# Patient Record
Sex: Female | Born: 1960 | Race: White | Hispanic: No | Marital: Married | Smoking: Former smoker
Health system: Southern US, Community
[De-identification: ages and names within clinical notes are randomized; demographics above are authoritative.]

## PROBLEM LIST (undated history)

## (undated) DIAGNOSIS — F32A Depression, unspecified: Secondary | ICD-10-CM

## (undated) DIAGNOSIS — M1990 Unspecified osteoarthritis, unspecified site: Secondary | ICD-10-CM

## (undated) DIAGNOSIS — F419 Anxiety disorder, unspecified: Secondary | ICD-10-CM

## (undated) DIAGNOSIS — M654 Radial styloid tenosynovitis [de Quervain]: Secondary | ICD-10-CM

## (undated) DIAGNOSIS — R112 Nausea with vomiting, unspecified: Secondary | ICD-10-CM

## (undated) DIAGNOSIS — D649 Anemia, unspecified: Secondary | ICD-10-CM

## (undated) DIAGNOSIS — Z9889 Other specified postprocedural states: Secondary | ICD-10-CM

## (undated) DIAGNOSIS — M2550 Pain in unspecified joint: Secondary | ICD-10-CM

## (undated) DIAGNOSIS — Z98811 Dental restoration status: Secondary | ICD-10-CM

## (undated) DIAGNOSIS — M549 Dorsalgia, unspecified: Secondary | ICD-10-CM

## (undated) DIAGNOSIS — R011 Cardiac murmur, unspecified: Secondary | ICD-10-CM

## (undated) DIAGNOSIS — M5030 Other cervical disc degeneration, unspecified cervical region: Secondary | ICD-10-CM

## (undated) DIAGNOSIS — E039 Hypothyroidism, unspecified: Secondary | ICD-10-CM

## (undated) DIAGNOSIS — K219 Gastro-esophageal reflux disease without esophagitis: Secondary | ICD-10-CM

## (undated) DIAGNOSIS — F329 Major depressive disorder, single episode, unspecified: Secondary | ICD-10-CM

## (undated) HISTORY — DX: Pain in unspecified joint: M25.50

## (undated) HISTORY — DX: Anemia, unspecified: D64.9

## (undated) HISTORY — DX: Unspecified osteoarthritis, unspecified site: M19.90

## (undated) HISTORY — DX: Gastro-esophageal reflux disease without esophagitis: K21.9

## (undated) HISTORY — PX: OTHER SURGICAL HISTORY: SHX169

## (undated) HISTORY — PX: TRIGGER FINGER RELEASE: SHX641

## (undated) HISTORY — DX: Anxiety disorder, unspecified: F41.9

## (undated) HISTORY — DX: Dorsalgia, unspecified: M54.9

---

## 1998-05-03 ENCOUNTER — Other Ambulatory Visit: Admission: RE | Admit: 1998-05-03 | Discharge: 1998-05-03 | Payer: Self-pay | Admitting: Obstetrics and Gynecology

## 1998-06-04 ENCOUNTER — Other Ambulatory Visit: Admission: RE | Admit: 1998-06-04 | Discharge: 1998-06-04 | Payer: Self-pay | Admitting: Obstetrics and Gynecology

## 1998-09-10 ENCOUNTER — Encounter: Admission: RE | Admit: 1998-09-10 | Discharge: 1998-12-09 | Payer: Self-pay | Admitting: Internal Medicine

## 1998-12-17 ENCOUNTER — Other Ambulatory Visit: Admission: RE | Admit: 1998-12-17 | Discharge: 1998-12-17 | Payer: Self-pay | Admitting: Obstetrics and Gynecology

## 1998-12-18 ENCOUNTER — Encounter: Admission: RE | Admit: 1998-12-18 | Discharge: 1999-03-18 | Payer: Self-pay | Admitting: Internal Medicine

## 1999-01-02 ENCOUNTER — Other Ambulatory Visit: Admission: RE | Admit: 1999-01-02 | Discharge: 1999-01-02 | Payer: Self-pay | Admitting: Obstetrics and Gynecology

## 1999-01-02 ENCOUNTER — Encounter (INDEPENDENT_AMBULATORY_CARE_PROVIDER_SITE_OTHER): Payer: Self-pay | Admitting: Specialist

## 1999-02-14 ENCOUNTER — Other Ambulatory Visit: Admission: RE | Admit: 1999-02-14 | Discharge: 1999-02-14 | Payer: Self-pay | Admitting: Obstetrics & Gynecology

## 1999-02-15 ENCOUNTER — Encounter (INDEPENDENT_AMBULATORY_CARE_PROVIDER_SITE_OTHER): Payer: Self-pay | Admitting: Specialist

## 1999-02-15 ENCOUNTER — Other Ambulatory Visit: Admission: RE | Admit: 1999-02-15 | Discharge: 1999-02-15 | Payer: Self-pay | Admitting: Obstetrics & Gynecology

## 1999-03-29 ENCOUNTER — Ambulatory Visit (HOSPITAL_COMMUNITY): Admission: RE | Admit: 1999-03-29 | Discharge: 1999-03-29 | Payer: Self-pay | Admitting: Obstetrics & Gynecology

## 1999-08-02 ENCOUNTER — Other Ambulatory Visit: Admission: RE | Admit: 1999-08-02 | Discharge: 1999-08-02 | Payer: Self-pay | Admitting: Obstetrics & Gynecology

## 2000-08-05 ENCOUNTER — Other Ambulatory Visit: Admission: RE | Admit: 2000-08-05 | Discharge: 2000-08-05 | Payer: Self-pay | Admitting: Obstetrics & Gynecology

## 2001-08-10 ENCOUNTER — Other Ambulatory Visit: Admission: RE | Admit: 2001-08-10 | Discharge: 2001-08-10 | Payer: Self-pay | Admitting: Obstetrics & Gynecology

## 2001-10-12 ENCOUNTER — Emergency Department (HOSPITAL_COMMUNITY): Admission: EM | Admit: 2001-10-12 | Discharge: 2001-10-12 | Payer: Self-pay | Admitting: Emergency Medicine

## 2002-02-08 ENCOUNTER — Ambulatory Visit (HOSPITAL_BASED_OUTPATIENT_CLINIC_OR_DEPARTMENT_OTHER): Admission: RE | Admit: 2002-02-08 | Discharge: 2002-02-08 | Payer: Self-pay | Admitting: Orthopedic Surgery

## 2002-02-08 ENCOUNTER — Encounter (INDEPENDENT_AMBULATORY_CARE_PROVIDER_SITE_OTHER): Payer: Self-pay | Admitting: *Deleted

## 2002-02-08 HISTORY — PX: DORSAL COMPARTMENT RELEASE: SHX1474

## 2002-08-26 ENCOUNTER — Other Ambulatory Visit: Admission: RE | Admit: 2002-08-26 | Discharge: 2002-08-26 | Payer: Self-pay | Admitting: Obstetrics & Gynecology

## 2003-01-08 ENCOUNTER — Observation Stay (HOSPITAL_COMMUNITY): Admission: EM | Admit: 2003-01-08 | Discharge: 2003-01-08 | Payer: Self-pay | Admitting: Emergency Medicine

## 2004-10-14 ENCOUNTER — Ambulatory Visit (HOSPITAL_BASED_OUTPATIENT_CLINIC_OR_DEPARTMENT_OTHER): Admission: RE | Admit: 2004-10-14 | Discharge: 2004-10-14 | Payer: Self-pay | Admitting: Orthopedic Surgery

## 2004-10-14 ENCOUNTER — Ambulatory Visit (HOSPITAL_COMMUNITY): Admission: RE | Admit: 2004-10-14 | Discharge: 2004-10-14 | Payer: Self-pay | Admitting: Orthopedic Surgery

## 2004-10-14 HISTORY — PX: KNEE ARTHROSCOPY: SHX127

## 2005-05-05 ENCOUNTER — Ambulatory Visit (HOSPITAL_BASED_OUTPATIENT_CLINIC_OR_DEPARTMENT_OTHER): Admission: RE | Admit: 2005-05-05 | Discharge: 2005-05-05 | Payer: Self-pay | Admitting: Orthopedic Surgery

## 2005-05-05 HISTORY — PX: SHOULDER ARTHROSCOPY: SHX128

## 2009-02-13 ENCOUNTER — Encounter (INDEPENDENT_AMBULATORY_CARE_PROVIDER_SITE_OTHER): Payer: Self-pay | Admitting: *Deleted

## 2009-02-14 ENCOUNTER — Encounter (INDEPENDENT_AMBULATORY_CARE_PROVIDER_SITE_OTHER): Payer: Self-pay | Admitting: *Deleted

## 2009-02-14 ENCOUNTER — Ambulatory Visit: Payer: Self-pay | Admitting: Gastroenterology

## 2009-02-16 ENCOUNTER — Telehealth: Payer: Self-pay | Admitting: Gastroenterology

## 2009-02-19 ENCOUNTER — Encounter: Payer: Self-pay | Admitting: Gastroenterology

## 2009-02-21 ENCOUNTER — Ambulatory Visit: Payer: Self-pay | Admitting: Gastroenterology

## 2010-06-11 LAB — GLUCOSE, CAPILLARY
Glucose-Capillary: 116 mg/dL — ABNORMAL HIGH (ref 70–99)
Glucose-Capillary: 126 mg/dL — ABNORMAL HIGH (ref 70–99)
Glucose-Capillary: 140 mg/dL — ABNORMAL HIGH (ref 70–99)

## 2010-07-26 NOTE — Op Note (Signed)
   Colleen Zuniga, Colleen Zuniga                       ACCOUNT NO.:  [id number]   MEDICAL RECORD NO.:  [id number]                   PATIENT TYPE:  AMB   LOCATION:  DSC                                  FACILITY:  [facility]   PHYSICIAN:  Cindee Salt, M.D.                    DATE OF BIRTH:  Oct 13, 1960   DATE OF PROCEDURE:  DATE OF DISCHARGE:                                 OPERATIVE REPORT   PREOPERATIVE DIAGNOSIS:  DeQuervain's left wrist.   POSTOPERATIVE DIAGNOSIS:  DeQuervain's left wrist.   OPERATION:  Release of first dorsal compartment, left wrist.   SURGEON:  Dr. Merlyn Lot.   ASSISTANT:  R.N.   ANESTHESIA:  General.   DATE OF OPERATION:  02/08/02.   HISTORY:  This patient is a 50 year old female with a history of  DeQuervain's which has not responded to conservative treatment.   DESCRIPTION OF PROCEDURE:  The patient was brought to the operating room  where a general anesthetic was carried out without difficulty.  She was  prepped and draped using Duraprep, left arm free, in the supine position.  A  tourniquet placed on the arm was then inflated to 250 mmHg after  exsanguination of the limb with an Esmarch bandage.  A longitudinal incision  was made over the first dorsal compartment, carried down through the  subcutaneous tissue and superficial branches of the radial nerve root were  protected.  A cyst was present on the first dorsal compartment, this was  removed and sent to Pathology.  The first dorsal compartment was then  released in its dorsal aspect, a partial septum was present.  No further  lesions were identified.  The wound was irrigated and the skin closed with a  subcuticular 4-0 Monocryl suture.  Steri-Strips were applied.  Sterile  compressive dressing and thumb spica splint applied.  The patient tolerated  the procedure well and was taken to the recovery room for observation, in  satisfactory condition.  She is discharged home, to return to the [facility]  [place] in one week, on Vicodin and Septra DS.                                               Cindee Salt, M.D.    GK/MEDQ  D:  02/08/2002  T:  02/08/2002  Job:  [id number]

## 2010-07-26 NOTE — Op Note (Signed)
Colleen Zuniga, Colleen Zuniga               ACCOUNT NO.:  [id number]   MEDICAL RECORD NO.:  [id number]          PATIENT TYPE:  AMB   LOCATION:  DSC                          FACILITY:  [facility]   PHYSICIAN:  Robert A. Thurston Hole, M.D. DATE OF BIRTH:  1960-07-15   DATE OF PROCEDURE:  05/05/2005  DATE OF DISCHARGE:                                 OPERATIVE REPORT   PREOPERATIVE DIAGNOSIS:  Left shoulder partial labrum, tear partial rotator  cuff tear with impingement.   POSTOPERATIVE DIAGNOSIS:  Left shoulder partial labrum, tear partial rotator  cuff tear with impingement, partial biceps tendon tear.   PROCEDURE:  1.  Left shoulder EUA followed by arthroscopic debridement, partial labrum      tear and partial rotator cuff tear.  2.  Left shoulder subacromial decompression.  3.  Debridement of partial biceps tendon tear.   SURGEON:  Dr. Salvatore Marvel.   ASSISTANT:  Kirstin Tomasa Rand, P.A.   ANESTHESIA:  General.   OPERATIVE TIME:  45 minutes.   COMPLICATIONS:  None.   INDICATIONS FOR PROCEDURE:  Colleen Zuniga is a 44-year woman who has had 6  months of increasing left shoulder pain with exam and MRI documenting  partial labrum tear, partial rotator cuff tear as well as partial biceps  tendon tear. She has had 6 months of pain in the left shoulder with MRI and  exam documenting partial labrum tear, partial biceps tendon tear, and  partial rotator cuff tear with impingement. She has failed conservative care  is now to undergo arthroscopy.   DESCRIPTION:  Colleen Zuniga is brought to operating room on 05/05/2005 after  interscalene block was placed in holding room by anesthesia. She was placed  operative table supine position. After being placed under general anesthesia  her left shoulder was examined.  She had near full range of motion. Her  shoulder was stable ligamentous exam. She received vancomycin 1 gram IV  preoperatively for prophylaxis. She was then placed in the beach chair  position  and her shoulder and arm was prepped using sterile DuraPrep and  draped using sterile technique. Originally through a posterior arthroscopic  portal, the arthroscope with a pump attached was placed and through an  anterior portal, an arthroscopic probe was placed. On initial inspection,  the articular cartilage of the glenohumeral joint was intact. She had  partial tearing, the anterior superior and posterior labrum 25-30% sent  which was debrided. The biceps tendon anchor was hypermobile but it was  intact. The biceps tendon showed partial tearing 25-30% which was debrided  but it was otherwise intact. Rotator cuff showed partial tearing 25-30% the  supraspinatus, subscapularis and infraspinatus and this was debrided but it  was otherwise intact. Inferior capsular recess was free of pathology.  Subacromial space was entered. A lateral arthroscopic portal was made. Large  amount of bursitis was resected. The rotator cuff was inflamed and thickened  on the bursal surface but no evidence of the tear. Impingement was noted and  a subacromial decompression was carried out removing 68 mm of the  undersurface of the anterior, anterolateral, anteromedial acromion and  CA  ligament release carried out as well. The [facility] joint was not impinging on  motion and was not resected. The shoulder cold be brought to a full range of  motion with no impingement on the rotator cuff after this was done. At this  point it was felt that all pathology been satisfactorily addressed.  Instruments were removed. Portals closed with 3-0 nylon suture. Sterile  dressings and a sling applied. The patient awakened and taken to recovery  room in stable condition.   FOLLOW-UP CARE:  Colleen Zuniga will be followed outpatient on Vicodin and  Naprosyn with early physical therapy. See her back in the office in a week  for sutures out and follow-up.      Robert A. Thurston Hole, M.D.  Electronically Signed     RAW/MEDQ  D:   05/05/2005  T:  05/05/2005  Job:  [id number]

## 2010-07-26 NOTE — Discharge Summary (Signed)
NAMEDELANEY, PERONA                       ACCOUNT NO.:  [id number]   MEDICAL RECORD NO.:  [id number]                   PATIENT TYPE:  INP   LOCATION:  5502                                 FACILITY:  [facility]   PHYSICIAN:  Mark A. Perini, M.D.                DATE OF BIRTH:  02-09-1961   DATE OF ADMISSION:  01/07/2003  DATE OF DISCHARGE:  01/08/2003                                 DISCHARGE SUMMARY   HISTORY OF PRESENT ILLNESS:  Please see complete history and physical for  details of admission.  Ms. Moustafa is a 50 year old female with long-  standing type 1 diabetes who presented with a two to three hour episode of  chest pain which occurred at rest.   HOSPITAL COURSE:  Ms. Vogler was admitted to a telemetry bed.  She had no  recurrence of chest pain symptoms.  She had serial cardiac enzymes which  remained normal.  Followup electrocardiogram was perfectly normal.  Therefore, she was deemed stable for discharge home with prompt followup  with her local cardiologist for further evaluation.   DISCHARGE DIAGNOSIS:  1. Chest pain, unclear etiology.  Gastroesophageal reflux disease versus     chest wall pain versus occult coronary disease.  2. Type 1 diabetes, long-standing.  3. Possible history of silent reflux disease.  4. Hypothyroidism.  5. 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  6. Functional heart murmur by history.   DISCHARGE MEDICATIONS:  1. Ms. Hutt is to resume all of her previous medications, including her     Humalog pump, her Synthroid at her previous dose, Avapro 300 mg daily.  2. She is to increase her Aciphex to 20 mg twice daily.  3. She is to continue Zoloft 50 mg daily.  4. She is to try to take her Lodine 400 mg only once daily.  5. She is to continue her vitamin supplements and as needed Histussin HC for     cough.   DISCHARGE PHYSICAL EXAMINATION:  VITAL SIGNS:  Temperature 97, pulse 77,  respiratory rate 20, CBG 91, blood pressure  109/63, 99% saturation on room  air.  GENERAL:  She is in no acute distress, she looks well.   DISCHARGE LABORATORY DATA:  White count 6.7, with a normal differential.  Hemoglobin 12.7, platelet count 217,000.  Sodium 140, potassium 3.8,  chloride 109, CO2 25, BUN 9, creatinine 0.6, glucose 110.  Liver function  tests normal, although albumin was 3.4, and total protein 5.8, which are  slightly low.  Repeat CK was 79 with a MB of 1.3, troponin-I was less than  0.01 on the morning of discharge.   DISCHARGE INSTRUCTIONS:  1. Ms. Gutknecht is to return to the emergency room if she has return of     significant chest pain.  2. She is to call if she has any problems.  3.     She is to follow  up in three weeks with Dr. Felipa Eth.  4. She is to see Dr. Holley Raring in the next one to three days.  5. She is to avoid overly strenuous activity until she can be evaluated by     her cardiologist.                                                Redge Gainer. Waynard Edwards, M.D.    MAP/MEDQ  D:  01/08/2003  T:  01/08/2003  Job:  [id number]

## 2010-07-26 NOTE — Op Note (Signed)
NAMEKUSHI, KUN               ACCOUNT NO.:  [id number]   MEDICAL RECORD NO.:  [id number]          PATIENT TYPE:  AMB   LOCATION:  DSC                          FACILITY:  [facility]   PHYSICIAN:  Robert A. Thurston Hole, M.D. DATE OF BIRTH:  11-10-1960   DATE OF PROCEDURE:  10/14/2004  DATE OF DISCHARGE:                                 OPERATIVE REPORT   PREOPERATIVE DIAGNOSIS:  Right knee medial meniscus tear.   POSTOPERATIVE DIAGNOSIS:  Right knee medial and lateral meniscal tears with  chondromalacia.   PROCEDURES:  1.  Right knee examination under anesthesia, followed by arthroscopic      partial medial and lateral meniscectomies.  2.  Right knee chondroplasty.   SURGEON:  Elana Alm. Thurston Hole, M.D.   ASSISTANT:  Julien Girt, P.A.   ANESTHESIA:  General.   OPERATIVE TIME:  30 minutes.   COMPLICATIONS:  None.   INDICATION FOR PROCEDURE:  Ms. Strother is a 50 year old woman who has had  six to eight weeks of increasing right knee pain with exam and MRI  documenting medial meniscus tear, who has failed conservative care and is  now to undergo arthroscopy.   DESCRIPTION:  Ms. Nest was brought to the operating room on October 14, 2004, placed on the operating table in supine position.  After an adequate  level of general anesthesia was obtained, her right knee was examined.  She  had full range of motion in her knee with stable ligamentous exam, with  normal patellar tracking.  The right leg was prepped using sterile DuraPrep  and draped using sterile technique.  Originally the arthroscopy was  performed through an anterolateral portal and the arthroscope with a pump  attached was placed in through an anteromedial portal.  An arthroscopic  probe was placed.  On initial inspection of the medial compartment, she had  25% grade 3 chondromalacia, which was debrided.  The rest were grade 1 and 2  changes.  Medial meniscus showed a tear of the posterior horn, of which 30-  40% was  resected back to a stable rim.  The intercondylar notch inspected,  anterior and posterior cruciate ligaments were normal.  Lateral compartment  inspected, and the articular cartilage was normal.  Lateral meniscus showed  a partial tear of 20%, posterior and lateral corner, which was resected back  to a stable rim.  The patellofemoral joint articular cartilage was normal  and the patella tracked normally.  Medial and lateral gutters were free of  pathology.  After this was done, it was felt that all pathology had been  satisfactorily addressed.  The instruments were removed.  The portals were  closed with 3-0 nylon suture and injected with 0.25% Marcaine with  epinephrine and 4 mg of morphine.  A sterile dressing was applied.  The  patient awakened and taken to the recovery room in stable condition.   FOLLOW-UP CARE:  Ms. Kuehnle will be followed as an outpatient on Vicodin  for pain.  See her back in the office in a week for sutures out and follow-  up.  RAW/MEDQ  D:  10/14/2004  T:  10/14/2004  Job:  [id number]

## 2010-07-26 NOTE — H&P (Signed)
NAMEELSI, Colleen Zuniga                       ACCOUNT NO.:  [id number]   MEDICAL RECORD NO.:  [id number]                   PATIENT TYPE:  EMS   LOCATION:  MAJO                                 FACILITY:  [facility]   PHYSICIAN:  Mark A. Perini, M.D.                DATE OF BIRTH:  [birth year]   DATE OF ADMISSION:  01/07/2003  DATE OF DISCHARGE:                                HISTORY & PHYSICAL   CHIEF COMPLAINT:  Chest pain.   HISTORY OF PRESENT ILLNESS:  The patient is a pleasant 50 year old female  who is a well-controlled type 1 diabetic since age 58.  She was in her usual  state of health until approximately 3:30 p.m. this afternoon.  Her CBG had  been approximately 375.  She gave herself bolus of Humalog insulin and then  had some cheese and coffee.  She then ran some errands.  Driving home at 6  o'clock p.m., she developed sudden onset of a heavy feeling in the center of  her chest. It felt as though a brick or iron were being layed on her chest.  The pain was rated 8-9/10 in severity.  There was no radiation at first, but  then later it did radiate under both breasts and somewhat into the upper  abdominal area.  She did develop some shortness of breath approximately 30  minutes into the episode.  There was no diaphoresis.  She did have some  nausea as well, but no vomiting.  She took two Tylenol and three baby  aspirin.  She can not identify any definite exacerbating or alleviating  positional or other factors.  She did burp and yawn a lot during this period  of time.  She presented to the emergency room and did have an EKG while she  was still in some discomfort which did not show any acute abnormalities.  She then had an IV placed, and the pain dissipated on its own without any  specific treatment being given.  The pain has been gone since 9:30 p.m. and  had not recurred.   PAST MEDICAL HISTORY:  1. Type 1 diabetes since age 35.  Her A1c is 6.1%, and she states that she     has  always had excellent control.  She checks her blood sugars     approximately 12 times a day.  She has had only one episode of mild DKA     in August of 2003.  2. Gastroesophageal reflux disease, but this is only postulated on the basis     of some cough she has had in the last two months.  She has never had     classic reflux symptoms.  3. Degenerative joint disease of both shoulders.  She also has rotator cuff     difficulties with both shoulders, and she has C-spine degenerative joint     disease.  4. Hypothyroidism for the last  25 years.  5. She denies any retinopathy, neuropathy or nephropathy signs or symptoms     or history.  She did have a stress test with Cardiolite images     approximately one year ago which she states was completely normal.  She     denies hypertension.  She takes Avapro for kidney protection only.  6. 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  7. She has a functional heart murmur since childhood.  Last echocardiogram     she states was normal.  She has never taken prophylactic antibiotics     before major dental procedures or other procedures.  8. She states that her cholesterol has overall been good.  She did have some     elevation in her LDL recently, but this improved once her thyroid control     was improved.   ALLERGIES:  1. PENICILLIN.  2. ALTACE.  She has had an Altace related cough in the past.   MEDICATIONS:  1. Humalog pump.  2. Synthroid either 0.15 or 0.125 mg daily.  She is not sure of the dose.  3. Avapro 300 mg daily.  4. Aciphex 20 mg daily.  5. Zoloft 50 mg daily.  6. Lodine 400 mg b.i.d.  7. Vitamins.  8. Multivitamins.  9. Calcium.  10.      Glucosamine daily.  11.      Histussin HC for cough as needed.   SOCIAL HISTORY:  No alcohol except orally.  No tobacco use.  No drug use.  She has been married to her husband Rosanne Ashing for the last 17 years.  She has a 90-  year-old daughter named Dion Saucier.   FAMILY HISTORY:  Father died at age 87 of a  stroke.  He had type 2 diabetes.  His first sign of vascular disease was at age 80.  Mother is alive and is  healthy at age 62.  She is one of eight children.  Two siblings died at a  very young age.  She had five living brothers and sisters.  She has one  younger brother with type 2 diabetes that is also well controlled.  No  family history of early coronary disease.   REVIEW OF SYSTEMS:  The patient denies any fever.  She did have a flu shot a  few weeks ago and then three days later developed some runny nose symptoms  and felt run-down for one week, but this has resolved.  She states no GI or  GU problems, no blood from above or below.  She has had no recent edema.  She has had no distant travel recently.   PHYSICAL EXAMINATION:  VITAL SIGNS:  Pulse 93, blood pressure 123/55, 98%  saturation on room air.  Respiratory rate 16 and non-labored.  GENERAL:  She is in no acute distress.  She is alert and oriented x4.  She  is a good historian.  There is no JVD, no icterus, no carotid bruits.  HEART:  Clear to auscultation bilaterally with no wheezes, rales or rhonchi.  HEART:  Regular rate and rhythm with a 2/6 murmur at the left sternal  border, in systole, with no radiation.  ABDOMEN: Benign.  There is no cyanosis, clubbing or edema.  The patient is  neurologically intact.  There are 2+ distal pulses.  EXTREMITIES:  Warm.   LABORATORY DATA:  EKG reveals normal sinus rhythm with borderline right  axis, but it is essentially normal.  Three sets of rapid CK-MB.  Troponin I  and myoglobin are within normal limits.  PH is 7.44, PCO2 32, bicarbonate  22, hemoglobin 15, hematocrit 43, sodium 137.  Potassium 4.5, chloride 106,  CO2 22, BUN 18, creatinine 0.7, glucose 224.   ASSESSMENT/PLAN:  1. The patient is a 50 year old female with long-standing type 1 diabetes     with three hours of substernal chest pain, with onset at rest.  All of    her initially data is negative so far.  Her EKG was  done when she was     still having some pain and did not show any significant ischemic changes.  2. I have discussed with her all of the options that we could pursue at this     time.  We have elected together to admit her and place her on telemetry     monitoring.  3. We will check another set of cardiac enzymes in the morning.  4. We will give her low-dose oxygen by nasal cannula.  5. We will get another EKG in the morning.  6. If there is no recurrence of her pain, and her lab and EKG data remains     normal, we will discharge her possibly tomorrow for further outpatient     followup with Dr. Macarthur Critchley. Torelli.  7. If she does develop any worrisome signs or symptoms, we will obtain an     inpatient cardiology consultation.  8. We will continue her other home medicines as they are.                                                Mark A. Waynard Edwards, M.D.    MAP/MEDQ  D:  01/08/2003  T:  01/08/2003  Job:  [id number]   cc:   Larina Earthly, M.D.  [address]  [place]  [place] [address]  Fax: [phone]   Macarthur Critchley. Shelva Majestic, M.D.  [address] [place]  [address]  [place]  [place] [address]  Fax: [phone]   Lunette Stands, M.D.  [address]  [place] [address]  Fax: [phone]

## 2011-01-03 ENCOUNTER — Encounter (HOSPITAL_BASED_OUTPATIENT_CLINIC_OR_DEPARTMENT_OTHER)
Admission: RE | Admit: 2011-01-03 | Discharge: 2011-01-03 | Disposition: A | Discharge status: Home / Self Care | Payer: PRIVATE HEALTH INSURANCE | Source: Ambulatory Visit | Attending: Orthopedic Surgery | Admitting: Orthopedic Surgery

## 2011-01-03 LAB — BASIC METABOLIC PANEL
BUN: 19 mg/dL (ref 6–23)
CO2: 29 mEq/L (ref 19–32)
Calcium: 10.2 mg/dL (ref 8.4–10.5)
Chloride: 105 mEq/L (ref 96–112)
Creatinine, Ser: 0.65 mg/dL (ref 0.50–1.10)
GFR calc Af Amer: >90 mL/min (ref >90)
GFR calc non Af Amer: >90 mL/min (ref >90)
Glucose, Bld: 48 mg/dL — ABNORMAL LOW (ref 70–99)
Potassium: 4.5 mEq/L (ref 3.5–5.1)
Sodium: 143 mEq/L (ref 135–145)

## 2011-01-07 ENCOUNTER — Ambulatory Visit (HOSPITAL_BASED_OUTPATIENT_CLINIC_OR_DEPARTMENT_OTHER)
Admission: RE | Admit: 2011-01-07 | Discharge: 2011-01-07 | Disposition: A | Discharge status: Home / Self Care | Payer: PRIVATE HEALTH INSURANCE | Source: Ambulatory Visit | Attending: Orthopedic Surgery | Admitting: Orthopedic Surgery

## 2011-01-07 DIAGNOSIS — M65839 Other synovitis and tenosynovitis, unspecified forearm: Secondary | ICD-10-CM | POA: Insufficient documentation

## 2011-01-07 DIAGNOSIS — Z01812 Encounter for preprocedural laboratory examination: Secondary | ICD-10-CM | POA: Insufficient documentation

## 2011-01-07 DIAGNOSIS — M6530 Trigger finger, unspecified finger: Secondary | ICD-10-CM | POA: Insufficient documentation

## 2011-01-07 DIAGNOSIS — Z01810 Encounter for preprocedural cardiovascular examination: Secondary | ICD-10-CM | POA: Insufficient documentation

## 2011-01-07 HISTORY — PX: TRIGGER FINGER RELEASE: SHX641

## 2011-01-07 LAB — GLUCOSE, CAPILLARY
Glucose-Capillary: 120 mg/dL — ABNORMAL HIGH (ref 70–99)
Glucose-Capillary: 84 mg/dL (ref 70–99)

## 2011-01-08 LAB — POCT HEMOGLOBIN-HEMACUE: Hemoglobin: 13.9 g/dL (ref 12.0–15.0)

## 2011-01-10 NOTE — Op Note (Signed)
  NAMEJENNIFIER, SMITHERMAN                ACCOUNT NO.:  [id number]  MEDICAL RECORD NO.:  [id number]  LOCATION:                                 FACILITY:  PHYSICIAN:  Cindee Salt, M.D.            DATE OF BIRTH:  DATE OF PROCEDURE:  01/07/2011 DATE OF DISCHARGE:                              OPERATIVE REPORT   PREOPERATIVE DIAGNOSIS: Stenosing tenosynovitis, left thumb.  POSTOPERATIVE DIAGNOSIS:  Stenosing tenosynovitis, left thumb.  OPERATION:  Release of A1 pulley, left thumb.  SURGEON:  Betha Loa, MD  ANESTHESIA:  Forearm based IV regional with local infiltration.  ANESTHESIOLOGIST:  Dr. Gelene Mink.  HISTORY:  The patient is a 50 year old female with a history of triggering of her left thumb.  She has undergone conservative treatment which has not resolved before.  Preoperative, peri, postoperative course have been discussed along with risks and complications.  She is aware there is no guarantee with the surgery, possibility of infection, recurrence, injury to arteries, nerves, tendons, incomplete relief of symptoms, dystrophy.  In preoperative area, the patient was seen, the extremity marked by both the patient and surgeon.  Antibiotic given.  DESCRIPTION OF PROCEDURE:  The patient was brought to the operating room where a forearm based IV regional anesthetic was carried out without difficulty.  She was prepped using ChloraPrep, supine position, left arm free.  A 3 minutes dry time was allowed.  Time-out taken, confirming the patient procedure.  A transverse incision was made over the A1 pulley of the left thumb, carried down through subcutaneous tissue.  Retractors placed protecting the neurovascular bundles radially and ulnarly.  With blunt and sharp dissection, the A1 pulley was identified.  This was released on its radial aspect taking care to protect the oblique pulley. The thumb placed through a full range motion, no further triggering was noted.  The pulley was  extremely tight.  The wound was then irrigated with saline and closed with interrupted 5-0 Vicryl Rapide sutures. Local infiltration, 0.25% Marcaine without epinephrine was given, approximately 4 mL was used.  Sterile compressive dressing with the thumb free was applied.  On deflation of the tourniquet, all fingers immediately pinked.  She was taken to the recovery room for observation in satisfactory condition.  She will be discharged home and return to [facility] [place] in 1 week on Vicodin.          ______________________________ Cindee Salt, M.D.     GK/MEDQ  D:  01/07/2011  T:  01/07/2011  Job:  [id number]  Electronically Signed by Cindee Salt M.D. on 01/10/2011 03:49:36 PM

## 2011-06-13 ENCOUNTER — Encounter: Payer: Self-pay | Admitting: *Deleted

## 2011-06-13 DIAGNOSIS — R079 Chest pain, unspecified: Secondary | ICD-10-CM | POA: Insufficient documentation

## 2011-06-13 DIAGNOSIS — M1990 Unspecified osteoarthritis, unspecified site: Secondary | ICD-10-CM | POA: Insufficient documentation

## 2011-06-13 DIAGNOSIS — E876 Hypokalemia: Secondary | ICD-10-CM | POA: Insufficient documentation

## 2012-01-09 DIAGNOSIS — M654 Radial styloid tenosynovitis [de Quervain]: Secondary | ICD-10-CM

## 2012-01-09 HISTORY — DX: Radial styloid tenosynovitis (de quervain): M65.4

## 2012-01-16 ENCOUNTER — Other Ambulatory Visit: Payer: Self-pay | Admitting: Orthopedic Surgery

## 2012-01-29 ENCOUNTER — Encounter (HOSPITAL_BASED_OUTPATIENT_CLINIC_OR_DEPARTMENT_OTHER): Payer: Self-pay | Admitting: *Deleted

## 2012-01-30 ENCOUNTER — Encounter (HOSPITAL_BASED_OUTPATIENT_CLINIC_OR_DEPARTMENT_OTHER)
Admission: RE | Admit: 2012-01-30 | Discharge: 2012-01-30 | Disposition: A | Discharge status: Home / Self Care | Payer: PRIVATE HEALTH INSURANCE | Source: Ambulatory Visit | Attending: Orthopedic Surgery | Admitting: Orthopedic Surgery

## 2012-01-30 ENCOUNTER — Encounter (HOSPITAL_BASED_OUTPATIENT_CLINIC_OR_DEPARTMENT_OTHER): Payer: Self-pay | Admitting: *Deleted

## 2012-01-30 NOTE — Pre-Procedure Instructions (Signed)
Lab results from Dr. Vicente Males office > 30 days; will do BMET when she comes for EKG.

## 2012-01-30 NOTE — Pre-Procedure Instructions (Signed)
To come for EKG; most recent lab results and history/physical requested from Dr. Vicente Males office.

## 2012-02-03 ENCOUNTER — Encounter (HOSPITAL_BASED_OUTPATIENT_CLINIC_OR_DEPARTMENT_OTHER): Payer: Self-pay | Admitting: Orthopedic Surgery

## 2012-02-03 ENCOUNTER — Encounter (HOSPITAL_BASED_OUTPATIENT_CLINIC_OR_DEPARTMENT_OTHER): Payer: Self-pay | Admitting: Certified Registered Nurse Anesthetist

## 2012-02-03 ENCOUNTER — Ambulatory Visit (HOSPITAL_BASED_OUTPATIENT_CLINIC_OR_DEPARTMENT_OTHER): Payer: PRIVATE HEALTH INSURANCE | Admitting: Certified Registered Nurse Anesthetist

## 2012-02-03 ENCOUNTER — Encounter (HOSPITAL_BASED_OUTPATIENT_CLINIC_OR_DEPARTMENT_OTHER)
Admission: RE | Disposition: A | Discharge status: Home / Self Care | Payer: Self-pay | Source: Ambulatory Visit | Attending: Orthopedic Surgery

## 2012-02-03 ENCOUNTER — Ambulatory Visit (HOSPITAL_BASED_OUTPATIENT_CLINIC_OR_DEPARTMENT_OTHER)
Admission: RE | Admit: 2012-02-03 | Discharge: 2012-02-03 | Disposition: A | Discharge status: Home / Self Care | Payer: PRIVATE HEALTH INSURANCE | Source: Ambulatory Visit | Attending: Orthopedic Surgery | Admitting: Orthopedic Surgery

## 2012-02-03 DIAGNOSIS — M5030 Other cervical disc degeneration, unspecified cervical region: Secondary | ICD-10-CM | POA: Insufficient documentation

## 2012-02-03 DIAGNOSIS — M129 Arthropathy, unspecified: Secondary | ICD-10-CM | POA: Insufficient documentation

## 2012-02-03 DIAGNOSIS — Z01810 Encounter for preprocedural cardiovascular examination: Secondary | ICD-10-CM | POA: Insufficient documentation

## 2012-02-03 DIAGNOSIS — Z794 Long term (current) use of insulin: Secondary | ICD-10-CM | POA: Insufficient documentation

## 2012-02-03 DIAGNOSIS — E039 Hypothyroidism, unspecified: Secondary | ICD-10-CM | POA: Insufficient documentation

## 2012-02-03 DIAGNOSIS — M654 Radial styloid tenosynovitis [de Quervain]: Secondary | ICD-10-CM | POA: Insufficient documentation

## 2012-02-03 DIAGNOSIS — Z01812 Encounter for preprocedural laboratory examination: Secondary | ICD-10-CM | POA: Insufficient documentation

## 2012-02-03 DIAGNOSIS — Z7982 Long term (current) use of aspirin: Secondary | ICD-10-CM | POA: Insufficient documentation

## 2012-02-03 DIAGNOSIS — E119 Type 2 diabetes mellitus without complications: Secondary | ICD-10-CM | POA: Insufficient documentation

## 2012-02-03 HISTORY — DX: Dental restoration status: Z98.811

## 2012-02-03 HISTORY — DX: Depression, unspecified: F32.A

## 2012-02-03 HISTORY — PX: DORSAL COMPARTMENT RELEASE: SHX5039

## 2012-02-03 HISTORY — DX: Other cervical disc degeneration, unspecified cervical region: M50.30

## 2012-02-03 HISTORY — DX: Hypothyroidism, unspecified: E03.9

## 2012-02-03 HISTORY — DX: Radial styloid tenosynovitis (de quervain): M65.4

## 2012-02-03 HISTORY — DX: Major depressive disorder, single episode, unspecified: F32.9

## 2012-02-03 HISTORY — DX: Cardiac murmur, unspecified: R01.1

## 2012-02-03 SURGERY — RELEASE, FIRST DORSAL COMPARTMENT, HAND
Anesthesia: General | Site: Wrist | Laterality: Right | Wound class: Clean

## 2012-02-03 MED ORDER — FENTANYL CITRATE 0.05 MG/ML IJ SOLN
INTRAMUSCULAR | Status: DC | PRN
  Administered 2012-02-03: 50 ug via INTRAVENOUS

## 2012-02-03 MED ORDER — LIDOCAINE HCL (CARDIAC) 20 MG/ML IV SOLN
INTRAVENOUS | Status: DC | PRN
  Administered 2012-02-03: 60 mg via INTRAVENOUS

## 2012-02-03 MED ORDER — ONDANSETRON HCL 4 MG/2ML IJ SOLN
INTRAMUSCULAR | Status: DC | PRN
  Administered 2012-02-03: 4 mg via INTRAVENOUS

## 2012-02-03 MED ORDER — CHLORHEXIDINE GLUCONATE 4 % EX LIQD: 60.0000 mL | Freq: Once | CUTANEOUS | Status: DC

## 2012-02-03 MED ORDER — LACTATED RINGERS IV SOLN
INTRAVENOUS | Status: DC
  Administered 2012-02-03: 09:00:00 via INTRAVENOUS

## 2012-02-03 MED ORDER — VANCOMYCIN HCL IN DEXTROSE 1-5 GM/200ML-% IV SOLN
1000.0000 mg | INTRAVENOUS | Status: AC
  Administered 2012-02-03: 1000 mg via INTRAVENOUS

## 2012-02-03 MED ORDER — OXYCODONE HCL 5 MG/5ML PO SOLN: 5.0000 mg | Freq: Once | ORAL | Status: DC | PRN

## 2012-02-03 MED ORDER — OXYCODONE-ACETAMINOPHEN 7.5-325 MG PO TABS: 1.0000 | ORAL_TABLET | ORAL | Status: DC | PRN

## 2012-02-03 MED ORDER — OXYCODONE HCL 5 MG PO TABS: 5.0000 mg | ORAL_TABLET | Freq: Once | ORAL | Status: DC | PRN

## 2012-02-03 MED ORDER — HYDROMORPHONE HCL PF 1 MG/ML IJ SOLN: 0.2500 mg | INTRAMUSCULAR | Status: DC | PRN

## 2012-02-03 MED ORDER — DEXAMETHASONE SODIUM PHOSPHATE 10 MG/ML IJ SOLN
INTRAMUSCULAR | Status: DC | PRN
  Administered 2012-02-03: 4 mg via INTRAVENOUS

## 2012-02-03 MED ORDER — ONDANSETRON HCL 4 MG/2ML IJ SOLN: 4.0000 mg | Freq: Once | INTRAMUSCULAR | Status: DC | PRN

## 2012-02-03 MED ORDER — PROPOFOL 10 MG/ML IV BOLUS
INTRAVENOUS | Status: DC | PRN
  Administered 2012-02-03: 200 mg via INTRAVENOUS

## 2012-02-03 MED ORDER — EPHEDRINE SULFATE 50 MG/ML IJ SOLN
INTRAMUSCULAR | Status: DC | PRN
  Administered 2012-02-03: 10 mg via INTRAVENOUS
  Administered 2012-02-03: 5 mg via INTRAVENOUS
  Administered 2012-02-03: 15 mg via INTRAVENOUS

## 2012-02-03 MED ORDER — BUPIVACAINE HCL (PF) 0.25 % IJ SOLN
INTRAMUSCULAR | Status: DC | PRN
  Administered 2012-02-03: 7 mL

## 2012-02-03 SURGICAL SUPPLY — 45 items
BANDAGE GAUZE ELAST BULKY 4 IN (GAUZE/BANDAGES/DRESSINGS) ×2 IMPLANT
BLADE SURG 15 STRL LF DISP TIS (BLADE) ×1 IMPLANT
BLADE SURG 15 STRL SS (BLADE) ×2
BNDG CMPR 9X4 STRL LF SNTH (GAUZE/BANDAGES/DRESSINGS) ×1
BNDG COHESIVE 3X5 TAN STRL LF (GAUZE/BANDAGES/DRESSINGS) ×2 IMPLANT
BNDG ESMARK 4X9 LF (GAUZE/BANDAGES/DRESSINGS) ×1 IMPLANT
CHLORAPREP W/TINT 26ML (MISCELLANEOUS) ×2 IMPLANT
CLOTH BEACON ORANGE TIMEOUT ST (SAFETY) ×2 IMPLANT
CORDS BIPOLAR (ELECTRODE) ×2 IMPLANT
COVER MAYO STAND STRL (DRAPES) ×2 IMPLANT
COVER TABLE BACK 60X90 (DRAPES) ×2 IMPLANT
CUFF TOURNIQUET SINGLE 18IN (TOURNIQUET CUFF) ×1 IMPLANT
DECANTER SPIKE VIAL GLASS SM (MISCELLANEOUS) IMPLANT
DRAPE EXTREMITY T 121X128X90 (DRAPE) ×2 IMPLANT
DRAPE SURG 17X23 STRL (DRAPES) ×2 IMPLANT
GAUZE XEROFORM 1X8 LF (GAUZE/BANDAGES/DRESSINGS) ×2 IMPLANT
GLOVE BIO SURGEON STRL SZ 6.5 (GLOVE) ×2 IMPLANT
GLOVE BIO SURGEON STRL SZ7.5 (GLOVE) ×1 IMPLANT
GLOVE BIOGEL PI IND STRL 8 (GLOVE) IMPLANT
GLOVE BIOGEL PI IND STRL 8.5 (GLOVE) ×1 IMPLANT
GLOVE BIOGEL PI INDICATOR 8 (GLOVE) ×1
GLOVE BIOGEL PI INDICATOR 8.5 (GLOVE) ×1
GLOVE INDICATOR 7.0 STRL GRN (GLOVE) ×1 IMPLANT
GLOVE SURG ORTHO 8.0 STRL STRW (GLOVE) ×2 IMPLANT
GOWN BRE IMP PREV XXLGXLNG (GOWN DISPOSABLE) ×3 IMPLANT
GOWN PREVENTION PLUS XLARGE (GOWN DISPOSABLE) ×2 IMPLANT
NEEDLE 27GAX1X1/2 (NEEDLE) ×1 IMPLANT
NS IRRIG 1000ML POUR BTL (IV SOLUTION) ×2 IMPLANT
PACK BASIN DAY SURGERY FS (CUSTOM PROCEDURE TRAY) ×2 IMPLANT
PAD CAST 3X4 CTTN HI CHSV (CAST SUPPLIES) ×1 IMPLANT
PADDING CAST ABS 4INX4YD NS (CAST SUPPLIES) ×1
PADDING CAST ABS COTTON 4X4 ST (CAST SUPPLIES) ×1 IMPLANT
PADDING CAST COTTON 3X4 STRL (CAST SUPPLIES) ×2
SPLINT PLASTER CAST XFAST 3X15 (CAST SUPPLIES) IMPLANT
SPLINT PLASTER XTRA FASTSET 3X (CAST SUPPLIES) ×10
SPONGE GAUZE 4X4 12PLY (GAUZE/BANDAGES/DRESSINGS) ×2 IMPLANT
STOCKINETTE 4X48 STRL (DRAPES) ×2 IMPLANT
SUT VIC AB 4-0 P2 18 (SUTURE) IMPLANT
SUT VICRYL 4-0 PS2 18IN ABS (SUTURE) IMPLANT
SUT VICRYL RAPIDE 4/0 PS 2 (SUTURE) ×2 IMPLANT
SYR BULB 3OZ (MISCELLANEOUS) ×2 IMPLANT
SYR CONTROL 10ML LL (SYRINGE) IMPLANT
TOWEL OR 17X24 6PK STRL BLUE (TOWEL DISPOSABLE) ×4 IMPLANT
UNDERPAD 30X30 INCONTINENT (UNDERPADS AND DIAPERS) ×2 IMPLANT
WATER STERILE IRR 1000ML POUR (IV SOLUTION) ×2 IMPLANT

## 2012-02-03 NOTE — Brief Op Note (Signed)
02/03/2012  10:14 AM  PATIENT:  Colleen Zuniga  51 y.o. female  PRE-OPERATIVE DIAGNOSIS:  DEQUERVAIN'S RIGHT WRIST  POST-OPERATIVE DIAGNOSIS:  * No post-op diagnosis entered *  PROCEDURE:  Procedure(s) (LRB) with comments: RELEASE DORSAL COMPARTMENT (DEQUERVAIN) (Right) - RELEASE DEQUERVAINS RIGHT WRIST  SURGEON:  Surgeon(s) and Role:    * Nicki Reaper, MD - Primary  PHYSICIAN ASSISTANT:   ASSISTANTS: K Raydan Schlabach,MD   ANESTHESIA:   local and general  EBL:  Total I/O In: 800 [I.V.:800] Out: -   BLOOD ADMINISTERED:none  DRAINS: none   LOCAL MEDICATIONS USED:  MARCAINE     SPECIMEN:  No Specimen  DISPOSITION OF SPECIMEN:  N/A  COUNTS:  YES  TOURNIQUET:   Total Tourniquet Time Documented: Upper Arm (Right) - 14 minutes  DICTATION: .Other Dictation: Dictation Number [id number]  PLAN OF CARE: Discharge to home after PACU  PATIENT DISPOSITION:  PACU - hemodynamically stable.

## 2012-02-03 NOTE — Op Note (Signed)
Dictation Number [id number]

## 2012-02-03 NOTE — Anesthesia Procedure Notes (Signed)
Procedure Name: LMA Insertion Date/Time: 02/03/2012 9:42 AM Performed by: Mckaila Duffus D Pre-anesthesia Checklist: Patient identified, Emergency Drugs available, Suction available and Patient being monitored Patient Re-evaluated:Patient Re-evaluated prior to inductionOxygen Delivery Method: Circle System Utilized Preoxygenation: Pre-oxygenation with 100% oxygen Intubation Type: IV induction Ventilation: Mask ventilation without difficulty LMA: LMA inserted LMA Size: 4.0 Number of attempts: 1 Airway Equipment and Method: bite block Placement Confirmation: positive ETCO2 Tube secured with: Tape Dental Injury: Teeth and Oropharynx as per pre-operative assessment

## 2012-02-03 NOTE — Anesthesia Preprocedure Evaluation (Signed)
Anesthesia Evaluation  Patient identified by MRN, date of birth, ID band Patient awake    Airway Mallampati: I TM Distance: >3 FB Neck ROM: Full    Dental  (+) Teeth Intact and Dental Advisory Given   Pulmonary  breath sounds clear to auscultation        Cardiovascular Rhythm:Regular Rate:Normal     Neuro/Psych    GI/Hepatic   Endo/Other  diabetes, Well Controlled, Type 1, Insulin Dependent  Renal/GU      Musculoskeletal   Abdominal   Peds  Hematology   Anesthesia Other Findings   Reproductive/Obstetrics                           Anesthesia Physical Anesthesia Plan  ASA: III  Anesthesia Plan: General   Post-op Pain Management:    Induction: Intravenous  Airway Management Planned: LMA  Additional Equipment:   Intra-op Plan:   Post-operative Plan: Extubation in OR  Informed Consent: I have reviewed the patients History and Physical, chart, labs and discussed the procedure including the risks, benefits and alternatives for the proposed anesthesia with the patient or authorized representative who has indicated his/her understanding and acceptance.   Dental advisory given  Plan Discussed with: CRNA, Anesthesiologist and Surgeon  Anesthesia Plan Comments:         Anesthesia Quick Evaluation

## 2012-02-03 NOTE — Transfer of Care (Signed)
Immediate Anesthesia Transfer of Care Note  Patient: Colleen Zuniga  Procedure(s) Performed: Procedure(s) (LRB) with comments: RELEASE DORSAL COMPARTMENT (DEQUERVAIN) (Right) - RELEASE DEQUERVAINS RIGHT WRIST  Patient Location: PACU  Anesthesia Type:General  Level of Consciousness: awake, alert , oriented and patient cooperative  Airway & Oxygen Therapy: Patient Spontanous Breathing and Patient connected to face mask oxygen  Post-op Assessment: Report given to PACU RN and Post -op Vital signs reviewed and stable  Post vital signs: Reviewed and stable  Complications: No apparent anesthesia complications

## 2012-02-03 NOTE — H&P (Signed)
Colleen Zuniga is complaining of pain in the first dorsal compartment, extensor tendons of her right wrist. This has been going on for approximately one week.  She has no history of injury.  She is 51 years-old and right handed.  She has history of diabetes.   ALLERGIES:   Penicillin.  MEDICATIONS:   Insulin, Synthroid, Clarinex, Wellbutrin, Lodine, Avapro, Percocet, vitamins.    SURGICAL HISTORY:   She has had trigger finger release, de Quervain's release and C-section.     FAMILY MEDICAL HISTORY:   Positive for diabetes, high blood pressure and arthritis.  SOCIAL HISTORY:  She does not smoke or drink.    REVIEW OF SYSTEMS:   Positive for glasses, otherwise negative for 14 points.  Colleen Zuniga is an 51 y.o. female.   Chief Complaint: Dequervain's rt HPI: see above  Past Medical History  Diagnosis Date  . Hypothyroidism   . Arthritis   . Depression   . Diabetes mellitus     Insulin pump  . Heart murmur     states has a functional murmur, and that she has never had any problems  . Degenerative disc disease, cervical     states neck is stiff, reduced range of motion  . Dental crowns present   . De Quervain's tenosynovitis, right 01/2012    Past Surgical History  Procedure Date  . Trigger finger release 01/07/2011    release A1 pulley left thumb  . Cesarean section   . Shoulder arthroscopy 05/05/2005    left  . Knee arthroscopy 10/14/2004    right  . Dorsal compartment release 02/08/2002    first dorsal compartment left wrist  . Trigger finger release     x 5 other fingers    History reviewed. No pertinent family history. Social History:  reports that she quit smoking about 20 years ago. She has never used smokeless tobacco. She reports that she drinks alcohol. She reports that she does not use illicit drugs.  Allergies:  Allergies  Allergen Reactions  . Penicillins Other (See Comments)    CLOSES THROAT  . Adhesive (Tape) Rash    Medications Prior to Admission    Medication Sig Dispense Refill  . Ascorbic Acid (VITAMIN C PO) Take by mouth daily.      Marland Kitchen aspirin 81 MG tablet Take 81 mg by mouth daily.      Marland Kitchen atorvastatin (LIPITOR) 20 MG tablet Take 10 mg by mouth daily.       Marland Kitchen BIOTIN PO Take by mouth daily.      Marland Kitchen buPROPion (WELLBUTRIN XL) 300 MG 24 hr tablet Take 300 mg by mouth daily.      . Calcium Carbonate-Vitamin D (CALCIUM + D PO) Take by mouth daily.      Marland Kitchen desloratadine (CLARINEX) 5 MG tablet Take 5 mg by mouth daily.      . Etodolac (LODINE PO) Take 500 mg by mouth daily. As directed      . Insulin Lispro, Human, (HUMALOG [place]) Inject into the skin. Patient has pump      . irbesartan (AVAPRO) 300 MG tablet Take 300 mg by mouth daily.       Marland Kitchen levothyroxine (SYNTHROID, LEVOTHROID) 125 MCG tablet Take 125 mcg by mouth daily.      Marland Kitchen oxyCODONE-acetaminophen (PERCOCET) 5-325 MG per tablet Take 1 tablet by mouth 2 (two) times daily.        No results found for this or any previous visit (from the past 48 hour(s)).  No results found.   Pertinent items are noted in HPI.  Height 5\' 4"  (1.626 m), weight 86.183 kg (190 lb).  General appearance: alert, cooperative and appears stated age Head: Normocephalic, without obvious abnormality Neck: no adenopathy Resp: clear to auscultation bilaterally Cardio: regular rate and rhythm, S1, S2 normal, no murmur, click, rub or gallop GI: soft, non-tender; bowel sounds normal; no masses,  no organomegaly Extremities: extremities normal, atraumatic, no cyanosis or edema Pulses: 2+ and symmetric Skin: Skin color, texture, turgor normal. No rashes or lesions Neurologic: Grossly normal Incision/Wound: na  Assessment/Plan The pre, peri and postoperative course were discussed along with the risks and complications.  The patient is aware there is no guarantee with the surgery, possibility of infection, recurrence, injury to arteries, nerves, tendons, incomplete relief of symptoms and dystrophy.    She is  scheduled for release first dorsal compartment right wrist as an outpatient under regional anesthesia.  Gerald Kuehl R 02/03/2012, 8:38 AM

## 2012-02-03 NOTE — Anesthesia Postprocedure Evaluation (Signed)
  Anesthesia Post-op Note  Patient: Colleen Zuniga  Procedure(s) Performed: Procedure(s) (LRB) with comments: RELEASE DORSAL COMPARTMENT (DEQUERVAIN) (Right) - RELEASE DEQUERVAINS RIGHT WRIST  Patient Location: PACU  Anesthesia Type:General  Level of Consciousness: awake, alert  and oriented  Airway and Oxygen Therapy: Patient Spontanous Breathing  Post-op Pain: mild  Post-op Assessment: Post-op Vital signs reviewed  Post-op Vital Signs: Reviewed  Complications: No apparent anesthesia complications

## 2012-02-04 NOTE — Op Note (Signed)
Colleen Zuniga, [name]               ACCOUNT NO.:  [id number]  MEDICAL RECORD NO.:  [id number]  LOCATION:                                 FACILITY:  PHYSICIAN:  Cindee Salt, M.D.            DATE OF BIRTH:  DATE OF PROCEDURE:  02/03/2012 DATE OF DISCHARGE:                              OPERATIVE REPORT   PREOPERATIVE DIAGNOSIS:  de Quervain tendinitis, right wrist.  POSTOPERATIVE DIAGNOSIS:  de Quervain tendinitis, right wrist.  OPERATION:  Release of first dorsal compartment, right wrist.  SURGEON:  Cindee Salt, MD  ASSISTANT:  Betha Loa, MD  ANESTHESIA:  General with local infiltration.  ANESTHESIOLOGIST:  Cruz.  HISTORY:  The patient is a 51 year old female with a history of a de Quervain tendinitis.  This has not responded to multiple injections, splinting, anti-inflammatories, and Medrol Dosepak.  She has elected to undergo surgical release.  Pre, peri, postoperative course have been discussed along with risks and complications.  She is aware that there is no guarantee with the surgery, possibility of infection, recurrence of injury to arteries, nerves, tendons, incomplete relief of symptoms, dystrophy, possibility of irritation, nerve problems from the radial nerve, possibility of subluxation of the tendons.  Preoperative area the patient is seen, the extremity marked by both patient and surgeon. Antibiotic given.  PROCEDURE:  The patient was brought to the operating room, where a general anesthetic was carried out without difficulty.  She was prepped using ChloraPrep in supine position with the right arm free.  A 3 minute dry time was allowed.  Time-out taken, confirming patient and procedure. The limb was exsanguinated with an Esmarch bandage.  Tourniquet placed on the upper arm was inflated to 250 mmHg.  The area of incision was injected with 0.25% Marcaine without epinephrine, approximately 4 mL was used.  A longitudinal incision was made in line with the first  dorsal compartment extensor tendons.  This was carried down through subcutaneous tissue.  Radial nerve was identified and protected as were neurovascular structures.  The dissection carried down to the first dorsal compartment.  An incision was then made on its most dorsal aspect.  Very significant thickening and tenosynovitis was present to both the extensor pollicis brevis, abductor pollicis longus.  These were each released, partial tenosynovectomy performed to each tendon.  Wound was copiously irrigated with saline.  The skin then closed with a subcuticular 4-0 Vicryl Rapide sutures.  Further injection was given with 0.25% Marcaine without epinephrine, total of 7-8 mL.  A sterile compressive dressing, thumb spica splint applied, fingers free.  On deflation of the tourniquet, all fingers immediately pinked.  She was taken to the recovery room for observation. She will be discharged home on Percocet.          ______________________________ Cindee Salt, M.D.     GK/MEDQ  D:  02/03/2012  T:  02/03/2012  Job:  [id number]

## 2012-02-09 ENCOUNTER — Encounter (HOSPITAL_BASED_OUTPATIENT_CLINIC_OR_DEPARTMENT_OTHER): Payer: Self-pay | Admitting: Orthopedic Surgery

## 2012-04-24 ENCOUNTER — Other Ambulatory Visit: Payer: Self-pay

## 2012-10-22 ENCOUNTER — Other Ambulatory Visit: Payer: Self-pay | Admitting: Orthopedic Surgery

## 2012-10-22 DIAGNOSIS — M5412 Radiculopathy, cervical region: Secondary | ICD-10-CM

## 2012-10-23 ENCOUNTER — Ambulatory Visit
Admission: RE | Admit: 2012-10-23 | Discharge: 2012-10-23 | Disposition: A | Discharge status: Home / Self Care | Payer: PRIVATE HEALTH INSURANCE | Source: Ambulatory Visit | Attending: Orthopedic Surgery | Admitting: Orthopedic Surgery

## 2012-10-23 DIAGNOSIS — M5412 Radiculopathy, cervical region: Secondary | ICD-10-CM

## 2013-01-13 ENCOUNTER — Other Ambulatory Visit: Payer: Self-pay

## 2013-12-16 ENCOUNTER — Encounter: Payer: Self-pay | Admitting: Gastroenterology

## 2013-12-23 ENCOUNTER — Other Ambulatory Visit: Payer: Self-pay

## 2015-12-18 ENCOUNTER — Other Ambulatory Visit: Payer: Self-pay | Admitting: Internal Medicine

## 2015-12-18 DIAGNOSIS — M47896 Other spondylosis, lumbar region: Secondary | ICD-10-CM

## 2015-12-18 DIAGNOSIS — M6281 Muscle weakness (generalized): Secondary | ICD-10-CM

## 2015-12-19 ENCOUNTER — Encounter: Payer: Self-pay | Admitting: Neurology

## 2015-12-19 ENCOUNTER — Ambulatory Visit (INDEPENDENT_AMBULATORY_CARE_PROVIDER_SITE_OTHER): Payer: PRIVATE HEALTH INSURANCE | Admitting: Neurology

## 2015-12-19 VITALS — BP 132/82 | HR 64

## 2015-12-19 DIAGNOSIS — R29898 Other symptoms and signs involving the musculoskeletal system: Secondary | ICD-10-CM | POA: Diagnosis not present

## 2015-12-19 DIAGNOSIS — R200 Anesthesia of skin: Secondary | ICD-10-CM

## 2015-12-19 NOTE — Progress Notes (Addendum)
NEUROLOGY CONSULTATION NOTE  Colleen Zuniga MRN: [id number] DOB: [birth year]  Referring provider: Dr. Felipa Eth Primary care provider: Dr. 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  She is accompanied by her husband who supplements history..  On 12/09/15, she received the flu vaccine.  On 12/14/15, she had bent over to pick something off the floor and when she stood up both her legs felt weak and cold from the feet up to the knees.  She couldn't keep herself up so her husband had to help her sit down.  Since then, she continues to feel weak in the lower legs and a cold sensation in the lower legs and feet.  She hasn't had any falls.  Symptoms are stable and have not progressed or improved.  She received a flu-vaccine on 12/09/15.  In early September, she had upper respiratory symptoms, including sore throat and nasal drip, which have since resolved.  She denied gastro-intestinal symptoms.  She denies shortness of breath, palpitations, diaphoresis, difficulty swallowing, double vision, or upper extremity weakness.  She does have a history of degenerative disc disease of the cervical and lumbar spines, causing chronic neck and back pain.  For several weeks, she has had cramps in the right leg and restless leg-type symptoms.  A ferritin level was reportedly checked and was normal.  She also notes numbness and pain in the hands.  She reports increased difficulty with writing, but is not sure if it is due to pain from her arthritis.  She had an MRI of the cervical spine on 10/23/12 for neck pain and right arm weakness, which was personally reviewed, which revealed multilevel spondylosis and facet disease and osteophytes causing narrowing of the canal with slight indentation of the cord  at C3-4, C4-5 and C5-6 levels, as well as foraminal encroachment bilaterally at C3-4, C4-5 and C5-6, but slightly more pronounced on the right at C5-6.  She also reportedly has degenerative disc disease of the lumbar spine, involving L2 through L5.  She has Type 1 diabetes, which she says has been well-controlled for many years.  Recent A1c reportedly around 5.7.  She reports a similar event of sudden lower leg weakness several years ago while in the shower.  However, symptoms were very brief.  Her symptoms may suggest Guillain-Barre Syndrome.  However the likelihood that it was from the influenza vaccine is low (increase in GBS incidence is approximately 1 per 1 million).  PAST MEDICAL HISTORY: Past Medical History:  Diagnosis Date  . Arthritis   . De Quervain's tenosynovitis, right 01/2012  . Degenerative disc disease, cervical    states neck is stiff, reduced range of motion  . Dental crowns present   . Depression   . Diabetes mellitus    Insulin pump  . Heart murmur    states has a functional murmur, and that she has never had any problems  . Hypothyroidism     PAST SURGICAL HISTORY: Past Surgical History:  Procedure Laterality Date  . CESAREAN SECTION    . DORSAL COMPARTMENT RELEASE  02/08/2002   first dorsal compartment left wrist  . DORSAL COMPARTMENT RELEASE  02/03/2012   Procedure: RELEASE DORSAL COMPARTMENT (DEQUERVAIN);  Surgeon: Nicki Reaper, MD;  Location: [facility];  Service: Orthopedics;  Laterality: Right;  RELEASE DEQUERVAINS RIGHT WRIST  . KNEE ARTHROSCOPY  10/14/2004   right  . SHOULDER ARTHROSCOPY  05/05/2005   left  . TRIGGER FINGER RELEASE  01/07/2011   release A1 pulley left thumb  . TRIGGER FINGER RELEASE     x 5 other fingers    MEDICATIONS: Current Outpatient Prescriptions on File Prior to Visit  Medication Sig Dispense Refill  . Ascorbic Acid (VITAMIN C PO) Take by mouth daily.    Marland Kitchen atorvastatin (LIPITOR) 20 MG tablet Take 10 mg by  mouth daily.     Marland Kitchen BIOTIN PO Take by mouth daily.    Marland Kitchen buPROPion (WELLBUTRIN XL) 300 MG 24 hr tablet Take 300 mg by mouth daily.    . Calcium Carbonate-Vitamin D (CALCIUM + D PO) Take by mouth daily.    Marland Kitchen desloratadine (CLARINEX) 5 MG tablet Take 5 mg by mouth daily.    . irbesartan (AVAPRO) 300 MG tablet Take 300 mg by mouth daily.     Marland Kitchen levothyroxine (SYNTHROID, LEVOTHROID) 125 MCG tablet Take 125 mcg by mouth daily.    Marland Kitchen oxyCODONE-acetaminophen (PERCOCET) 5-325 MG per tablet Take 1 tablet by mouth 2 (two) times daily.     No current facility-administered medications on file prior to visit.     ALLERGIES: Allergies  Allergen Reactions  . Penicillins Other (See Comments)    CLOSES THROAT  . Adhesive [Tape] Rash    FAMILY HISTORY: Family History  Problem Relation Age of Onset  . Stroke Father     SOCIAL HISTORY: Social History   Social History  . Marital status: Married    Spouse name: N/A  . Number of children: N/A  . Years of education: N/A   Occupational History  . Not on file.   Social History Main Topics  . Smoking status: Former Smoker    Quit date: 03/11/1991  . Smokeless tobacco: Never Used  . Alcohol use Yes     Comment: rarely  . Drug use: No  . Sexual activity: Not on file   Other Topics Concern  . Not on file   Social History Narrative  . No narrative on file    REVIEW OF SYSTEMS: Constitutional: No fevers, chills, or sweats, no generalized fatigue, change in appetite Eyes: No visual changes, double vision, eye pain Ear, nose and throat: No hearing loss, ear pain, nasal congestion, sore throat Cardiovascular: No chest pain, palpitations Respiratory:  No shortness of breath at rest or with exertion, wheezes GastrointestinaI: No nausea, vomiting, diarrhea, abdominal pain, fecal incontinence Genitourinary:  No dysuria, urinary retention or frequency Musculoskeletal:  No neck pain, back pain Integumentary: No rash, pruritus, skin  lesions Neurological: as above Psychiatric: No depression, insomnia, anxiety Endocrine: No palpitations, fatigue, diaphoresis, mood swings, change in appetite, change in weight, increased thirst Hematologic/Lymphatic:  No purpura, petechiae. Allergic/Immunologic: no itchy/runny eyes, nasal congestion, recent allergic reactions, rashes  PHYSICAL EXAM: Vitals:   12/19/15 0945  BP: 132/82  Pulse: 64   General: No acute distress.  Patient appears well-groomed.  Head:  Normocephalic/atraumatic Eyes:  fundi examined but not visualized Neck: supple, no paraspinal tenderness, full range of motion Back: No paraspinal tenderness Heart: regular rate and rhythm Lungs: Clear to auscultation bilaterally. Vascular: No carotid bruits. Neurological Exam: Mental status: alert and oriented to person, place, and time, recent and remote memory intact, fund of knowledge intact, attention and concentration intact, speech fluent and not dysarthric, language intact. Cranial nerves: CN I: not tested CN II: pupils equal, round  and reactive to light, visual fields intact CN III, IV, VI:  full range of motion, no nystagmus, no ptosis CN V: facial sensation intact CN VII: upper and lower face symmetric CN VIII: hearing intact CN IX, X: gag intact, uvula midline CN XI: sternocleidomastoid and trapezius muscles intact CN XII: tongue midline Bulk & Tone: normal, no fasciculations. Motor:  5/5 throughout  Sensation:  Pinprick sensation intact and vibration sensation reduced in toes. Deep Tendon Reflexes:  2+ in upper extremities and left patellar, absent in right patellar and ankles, toes downgoing.  Finger to nose testing:  Without dysmetria.  Heel to shin:  Without dysmetria.  Gait:  Normal station and stride.  Able to turn, walk on toes, heels and in tandem. Romberg negative.  IMPRESSION: Subjective lower extremity weakness and numbness.  Differential diagnosis is wide.  Given the sudden onset of  symptoms, Guillain-Barre Syndrome is possible, however I do not appreciate any objective findings on exam except for absent reflexes in the lower extremities, which she says she always had since childhood.  Also, she reports a similar albeit brief episode several years ago, which would be unusual for GBS.  If this was GBS, the likelihood that it was from the influenza vaccine is low (increase in GBS incidence is approximately 1 per 1 million). She has history of cervical and lumbar disc disease, which may be playing a role, particularly the lumbar spine as she does not exhibit upper motor signs to suggest myelopathy from cervical spinal stenosis.  She exhibits evidence of peripheral neuropathy in the toes, as demonstrated by reduced vibration sensation, but I am not sure if this is chronic (related to lumbar radiculopathy versus an idiopathic peripheral neuropathy.  PLAN: 1.  Given the sudden onset of symptoms, I would favor a lumbar puncture as soon as possible to evaluate for elevated protein, which would suggest GBS and therefore indication for treatment with IVIg.  She has decided to hold off on this.  I did instruct her to go to the ED if she should have any worsening symptoms (worsening ascending numbness and weakness, shortness of breath, etc).  I recommended NCV-EMG in 2 weeks (as symptoms began less than a week ago, sensitivity of NCV-EMG at this time is low) and we can continue workup from there.  She would like to hold off further workup at this time.  She already has some tests, including MRI of lumbar spine and B12, ordered by her PCP.  I asked her to contact me if she would like to be re-evaluated and pursue further testing if needed.  ADDENDUM:  B12 was over 2000  Thank you for allowing me to take part in the care of this patient.  Shon MilletAdam Ezekiel Menzer, DO  CC:  Ravisankar R. Felipa EthAvva, MD

## 2015-12-19 NOTE — Patient Instructions (Addendum)
If symptoms get worse, then go to the ED. At the very least, I would like to check a nerve study in 2 to 3 weeks, so contact me if you wish to pursue this Contact me if you wish to pursue further investigation

## 2015-12-19 NOTE — Progress Notes (Signed)
Chart forwarded.  

## 2015-12-28 ENCOUNTER — Inpatient Hospital Stay: Admission: RE | Admit: 2015-12-28 | Payer: PRIVATE HEALTH INSURANCE | Source: Ambulatory Visit

## 2015-12-28 ENCOUNTER — Ambulatory Visit
Admission: RE | Admit: 2015-12-28 | Discharge: 2015-12-28 | Disposition: A | Discharge status: Home / Self Care | Payer: PRIVATE HEALTH INSURANCE | Source: Ambulatory Visit | Attending: Internal Medicine | Admitting: Internal Medicine

## 2015-12-28 DIAGNOSIS — M47896 Other spondylosis, lumbar region: Secondary | ICD-10-CM

## 2015-12-28 DIAGNOSIS — M6281 Muscle weakness (generalized): Secondary | ICD-10-CM

## 2016-01-15 DIAGNOSIS — M4712 Other spondylosis with myelopathy, cervical region: Secondary | ICD-10-CM | POA: Insufficient documentation

## 2016-01-15 DIAGNOSIS — M419 Scoliosis, unspecified: Secondary | ICD-10-CM | POA: Insufficient documentation

## 2016-02-20 HISTORY — PX: ANTERIOR CERVICAL DISCECTOMY: SHX1160

## 2016-05-22 ENCOUNTER — Other Ambulatory Visit: Payer: Self-pay | Admitting: Neurosurgery

## 2016-05-22 DIAGNOSIS — M4712 Other spondylosis with myelopathy, cervical region: Secondary | ICD-10-CM

## 2016-05-27 ENCOUNTER — Ambulatory Visit
Admission: RE | Admit: 2016-05-27 | Discharge: 2016-05-27 | Disposition: A | Discharge status: Home / Self Care | Payer: PRIVATE HEALTH INSURANCE | Source: Ambulatory Visit | Attending: Neurosurgery | Admitting: Neurosurgery

## 2016-05-27 DIAGNOSIS — M4712 Other spondylosis with myelopathy, cervical region: Secondary | ICD-10-CM

## 2016-05-28 ENCOUNTER — Other Ambulatory Visit: Payer: Self-pay

## 2016-05-29 ENCOUNTER — Other Ambulatory Visit: Payer: Self-pay | Admitting: Neurosurgery

## 2016-06-12 NOTE — Pre-Procedure Instructions (Addendum)
Colleen Zuniga  06/12/2016      [id number] [name], [place] - [name] [place] [address] Phone: [phone] Fax: [phone]  [facility] [place], [place] - [address] AT Portal to Registered [facility] Sites [address] [place] [address] Phone: [phone] Fax: [phone]    Your procedure is scheduled on April 16  Report to [facility] Admitting at 0530 A.M.  Call this number if you have problems the morning of surgery:  [phone]   Remember:  Do not eat food or drink liquids after midnight.   Take these medicines the morning of surgery with A SIP OF WATER acetaminophen (TYLENOL), ALPRAZolam (XANAX), buPROPion (WELLBUTRIN XL, desloratadine (CLARINEX), gabapentin (NEURONTIN),  levothyroxine (SYNTHROID, LEVOTHROID) , oxyCODONE-acetaminophen (PERCOCET)   Take all other medications as prescribed except 7 days prior to surgery STOP taking any etodolac (LODINE),  Aspirin, Aleve, Naproxen, Ibuprofen, Motrin, Advil, Goody's, BC's, all herbal medications, fish oil, and all vitamins    Do not wear jewelry, make-up or nail polish.  Do not wear lotions, powders, or perfumes, or deoderant.  Do not shave 48 hours prior to surgery.  Men may shave face and neck.  Do not bring valuables to the hospital.  [facility] is not responsible for any belongings or valuables.    How to Manage Your Diabetes Before and After Surgery  Why is it important to control my blood sugar before and after surgery? . Improving blood sugar levels before and after surgery helps healing and can limit problems. . A way of improving blood sugar control is eating a healthy diet by: o  Eating less sugar and carbohydrates o  Increasing activity/exercise o  Talking with your doctor about reaching your blood sugar goals . High blood sugars (greater than 180 mg/dL) can raise your risk of infections and slow your recovery, so you  will need to focus on controlling your diabetes during the weeks before surgery. . Make sure that the doctor who takes care of your diabetes knows about your planned surgery including the date and location.  How do I manage my blood sugar before surgery? . Check your blood sugar at least 4 times a day, starting 2 days before surgery, to make sure that the level is not too high or low. o Check your blood sugar the morning of your surgery when you wake up and every 2 hours until you get to the Short Stay unit. . If your blood sugar is less than 70 mg/dL, you will need to treat for low blood sugar: o Do not take insulin. o Treat a low blood sugar (less than 70 mg/dL) with  cup of clear juice (cranberry or apple), 4 glucose tablets, OR glucose gel. o Recheck blood sugar in 15 minutes after treatment (to make sure it is greater than 70 mg/dL). If your blood sugar is not greater than 70 mg/dL on recheck, call [phone] for further instructions. . Report your blood sugar to the short stay nurse when you get to Short Stay.  . If you are admitted to the hospital after surgery: o Your blood sugar will be checked by the staff and you will probably be given insulin after surgery (instead of oral diabetes medicines) to make sure you have good blood sugar levels. o The goal for blood sugar control after surgery is 80-180 mg/dL.    Insulin pump:  Reduce basal rate by 20% at midnight  the night before surgery or contact the doctor that manages your diabetes for instructions.          WHAT DO I DO ABOUT MY DIABETES MEDICATION?   Marland Kitchen Do not take oral diabetes medicines (pills) the morning of surgery.  . THE NIGHT BEFORE SURGERY, take ___________ units of ___________insulin.       Marland Kitchen HE MORNING OF SURGERY, take _____________ units of __________insulin.  . The day of surgery, do not take other diabetes injectables, including Byetta (exenatide), Bydureon (exenatide ER), Victoza (liraglutide), or  Trulicity (dulaglutide).  . If your CBG is greater than 220 mg/dL, you may take  of your sliding scale (correction) dose of insulin.  Other Instructions:          Patient Signature:  Date:   Nurse Signature:  Date:   Reviewed and Endorsed by [facility] Patient Education Committee, August 2015 Contacts, dentures or bridgework may not be worn into surgery.  Leave your suitcase in the car.  After surgery it may be brought to your room.  For patients admitted to the hospital, discharge time will be determined by your treatment team.  Patients discharged the day of surgery will not be allowed to drive home.    Special instructions:   [facility]- Preparing For Surgery  Before surgery, you can play an important role. Because skin is not sterile, your skin needs to be as free of germs as possible. You can reduce the number of germs on your skin by washing with CHG (chlorahexidine gluconate) Soap before surgery.  CHG is an antiseptic cleaner which kills germs and bonds with the skin to continue killing germs even after washing.  Please do not use if you have an allergy to CHG or antibacterial soaps. If your skin becomes reddened/irritated stop using the CHG.  Do not shave (including legs and underarms) for at least 48 hours prior to first CHG shower. It is OK to shave your face.  Please follow these instructions carefully.   1. Shower the NIGHT BEFORE SURGERY and the MORNING OF SURGERY with CHG.   2. If you chose to wash your hair, wash your hair first as usual with your normal shampoo.  3. After you shampoo, rinse your hair and body thoroughly to remove the shampoo.  4. Use CHG as you would any other liquid soap. You can apply CHG directly to the skin and wash gently with a scrungie or a clean washcloth.   5. Apply the CHG Soap to your body ONLY FROM THE NECK DOWN.  Do not use on open wounds or open sores. Avoid contact with your eyes, ears, mouth and genitals (private parts).  Wash genitals (private parts) with your normal soap.  6. Wash thoroughly, paying special attention to the area where your surgery will be performed.  7. Thoroughly rinse your body with warm water from the neck down.  8. DO NOT shower/wash with your normal soap after using and rinsing off the CHG Soap.  9. Pat yourself dry with a CLEAN TOWEL.   10. Wear CLEAN PAJAMAS   11. Place CLEAN SHEETS on your bed the night of your first shower and DO NOT SLEEP WITH PETS.    Day of Surgery: Do not apply any deodorants/lotions. Please wear clean clothes to the hospital/surgery center.      Please read over the following fact sheets that you were given.

## 2016-06-13 ENCOUNTER — Encounter (HOSPITAL_COMMUNITY): Payer: Self-pay | Admitting: Vascular Surgery

## 2016-06-13 ENCOUNTER — Encounter (HOSPITAL_COMMUNITY): Payer: Self-pay

## 2016-06-13 ENCOUNTER — Telehealth (HOSPITAL_COMMUNITY): Payer: Self-pay | Admitting: *Deleted

## 2016-06-13 ENCOUNTER — Encounter (HOSPITAL_COMMUNITY)
Admission: RE | Admit: 2016-06-13 | Discharge: 2016-06-13 | Disposition: A | Discharge status: Home / Self Care | Payer: PRIVATE HEALTH INSURANCE | Source: Ambulatory Visit | Attending: Neurosurgery | Admitting: Neurosurgery

## 2016-06-13 DIAGNOSIS — E119 Type 2 diabetes mellitus without complications: Secondary | ICD-10-CM | POA: Insufficient documentation

## 2016-06-13 DIAGNOSIS — M48061 Spinal stenosis, lumbar region without neurogenic claudication: Secondary | ICD-10-CM | POA: Insufficient documentation

## 2016-06-13 DIAGNOSIS — R011 Cardiac murmur, unspecified: Secondary | ICD-10-CM | POA: Insufficient documentation

## 2016-06-13 DIAGNOSIS — E876 Hypokalemia: Secondary | ICD-10-CM | POA: Insufficient documentation

## 2016-06-13 DIAGNOSIS — M1990 Unspecified osteoarthritis, unspecified site: Secondary | ICD-10-CM | POA: Insufficient documentation

## 2016-06-13 DIAGNOSIS — F329 Major depressive disorder, single episode, unspecified: Secondary | ICD-10-CM | POA: Insufficient documentation

## 2016-06-13 DIAGNOSIS — I517 Cardiomegaly: Secondary | ICD-10-CM | POA: Insufficient documentation

## 2016-06-13 DIAGNOSIS — Z01812 Encounter for preprocedural laboratory examination: Secondary | ICD-10-CM | POA: Insufficient documentation

## 2016-06-13 DIAGNOSIS — Z01810 Encounter for preprocedural cardiovascular examination: Secondary | ICD-10-CM | POA: Diagnosis present

## 2016-06-13 HISTORY — DX: Nausea with vomiting, unspecified: R11.2

## 2016-06-13 HISTORY — DX: Other specified postprocedural states: Z98.890

## 2016-06-13 LAB — CBC
HEMATOCRIT: 36.6 % (ref 36.0–46.0)
Hemoglobin: 12.3 g/dL (ref 12.0–15.0)
MCH: 31.7 pg (ref 26.0–34.0)
MCHC: 33.6 g/dL (ref 30.0–36.0)
MCV: 94.3 fL (ref 78.0–100.0)
PLATELETS: 230 10*3/uL (ref 150–400)
RBC: 3.88 MIL/uL (ref 3.87–5.11)
RDW: 12.1 % (ref 11.5–15.5)
WBC: 5.3 10*3/uL (ref 4.0–10.5)

## 2016-06-13 LAB — BASIC METABOLIC PANEL
Anion gap: 8 (ref 5–15)
BUN: 15 mg/dL (ref 6–20)
CALCIUM: 9.9 mg/dL (ref 8.9–10.3)
CO2: 28 mmol/L (ref 22–32)
Chloride: 105 mmol/L (ref 101–111)
Creatinine, Ser: 0.77 mg/dL (ref 0.44–1.00)
GFR calc Af Amer: >60 mL/min (ref >60)
GLUCOSE: 102 mg/dL — AB (ref 65–99)
Potassium: 4.2 mmol/L (ref 3.5–5.1)
Sodium: 141 mmol/L (ref 135–145)

## 2016-06-13 LAB — GLUCOSE, CAPILLARY
Glucose-Capillary: 52 mg/dL — ABNORMAL LOW (ref 65–99)
Glucose-Capillary: 91 mg/dL (ref 65–99)

## 2016-06-13 LAB — SURGICAL PCR SCREEN
MRSA, PCR: NEGATIVE
Staphylococcus aureus: NEGATIVE

## 2016-06-13 LAB — TYPE AND SCREEN
ABO/RH(D): B POS
Antibody Screen: NEGATIVE

## 2016-06-13 LAB — ABO/RH: ABO/RH(D): B POS

## 2016-06-13 NOTE — Progress Notes (Signed)
PCP:Dr. Felipa Eth @ [facility]--also manages diabetes  Fasting sugars 104, pt. Has insulin pump.Pt. Also has CGM: continuous glucose sensor monitor.   When pt. Arrive sugar 52, not symptomatic,refused to eat or drink, butr took life savor candies.  Rechecked sugar and 91.   Loleta Books , PA informed of sugars and insulin pump. She states she would inform diabetes coordinator.

## 2016-06-17 NOTE — Progress Notes (Signed)
Anesthesia Chart Review: Patient is a 56 year old female scheduled for anterior lateral lumbar interbody fusion L2-3, L3-4, L4-5, L2-5 pedicle screws fixation 06/23/2016 by Dr. Newell Coral. OR room is booked from 0730-1505.  History includes former smoker (quit '93), post-operative N/V, hypothyroidism, arthritis, diabetes mellitus type 1 on insulin pump, depression, "functional murmur", dental crowns, ACDF 02/20/16.  Patient has an insulin pump and also a continuous glucose sensor monitor (Dexcom G5) that is located on her abdomen. (Patient would like to wear perioperatively and plans to bring in her receiver. However, in discussion with DME Boneta Lucks (diabetes RN educator), continuous glucose monitor (CGM) can be removed if it interferes with the operative field. It it does remain in place, patient would still need periodic hospital glucose monitoring to verify accuracy. Boneta Lucks also recommends a post-operative Hospitalist consult to assist with DM management (I have notified Nicki at Dr. Earl Gala office).    PCP is Dr. Chilton Greathouse at [facility]), last visit 05/06/16.   Meds include Xanax, Lipitor, Wellbutrin XL, Neurontin, NovoLog, Avapro, levothyroxine, magnesium, Percocet, potassium gluconate.  BP (!) 149/81   Pulse 60   Temp 36.8 C   Resp 20   Ht 5' 1.5" (1.562 m)   Wt 168 lb 4.8 oz (76.3 kg)   SpO2 100%   BMI 31.29 kg/m   EKG 06/13/16: NSR, possible LAE. She reported an echo > 10 years ago.   MRI C-spine 05/27/16: IMPRESSION: 1. Interval C3-C5 ACDF.  No significant residual spinal stenosis. 2. Advanced C5-6 disc degeneration with new degenerative endplate edema. Unchanged mild bilateral neural foraminal stenosis. 3. Unchanged mild spinal and moderate bilateral foraminal stenosis at C6-7.  Preoperative labs noted. Cr 0.77. Glucose 102. CBC WNL. T&S done. A1c on 05/06/16 was 5.8 ([facility]).  If no acute changes then I would anticipate that she can proceed. Her surgery is  posted for > 2 hours, so she will need IV insulin gtt started once insulin pump is removed. I have sent a consult to DM educator.  Velna Ochs [facility] Short Stay Center/Anesthesiology Phone [phone] 06/17/2016 3:08 PM

## 2016-06-23 ENCOUNTER — Encounter (HOSPITAL_COMMUNITY): Admission: RE | Payer: Self-pay | Source: Ambulatory Visit

## 2016-06-23 ENCOUNTER — Inpatient Hospital Stay (HOSPITAL_COMMUNITY): Admission: RE | Admit: 2016-06-23 | Payer: PRIVATE HEALTH INSURANCE | Source: Ambulatory Visit | Admitting: Neurosurgery

## 2016-06-23 SURGERY — ANTERIOR LATERAL LUMBAR FUSION 3 LEVELS
Anesthesia: General

## 2016-06-30 ENCOUNTER — Other Ambulatory Visit: Payer: Self-pay | Admitting: Neurosurgery

## 2016-08-11 ENCOUNTER — Encounter (HOSPITAL_COMMUNITY)
Admission: RE | Admit: 2016-08-11 | Discharge: 2016-08-11 | Disposition: A | Discharge status: Home / Self Care | Payer: PRIVATE HEALTH INSURANCE | Source: Ambulatory Visit | Attending: Neurosurgery | Admitting: Neurosurgery

## 2016-08-11 ENCOUNTER — Encounter (HOSPITAL_COMMUNITY): Payer: Self-pay

## 2016-08-11 DIAGNOSIS — Z79899 Other long term (current) drug therapy: Secondary | ICD-10-CM | POA: Diagnosis not present

## 2016-08-11 DIAGNOSIS — F329 Major depressive disorder, single episode, unspecified: Secondary | ICD-10-CM | POA: Insufficient documentation

## 2016-08-11 DIAGNOSIS — Z794 Long term (current) use of insulin: Secondary | ICD-10-CM | POA: Diagnosis not present

## 2016-08-11 DIAGNOSIS — Z01812 Encounter for preprocedural laboratory examination: Secondary | ICD-10-CM | POA: Insufficient documentation

## 2016-08-11 DIAGNOSIS — E109 Type 1 diabetes mellitus without complications: Secondary | ICD-10-CM | POA: Insufficient documentation

## 2016-08-11 DIAGNOSIS — M5030 Other cervical disc degeneration, unspecified cervical region: Secondary | ICD-10-CM | POA: Diagnosis not present

## 2016-08-11 LAB — BASIC METABOLIC PANEL
Anion gap: 8 (ref 5–15)
BUN: 18 mg/dL (ref 6–20)
CO2: 28 mmol/L (ref 22–32)
CREATININE: 0.84 mg/dL (ref 0.44–1.00)
Calcium: 9.8 mg/dL (ref 8.9–10.3)
Chloride: 105 mmol/L (ref 101–111)
GFR calc Af Amer: >60 mL/min (ref >60)
GLUCOSE: 62 mg/dL — AB (ref 65–99)
POTASSIUM: 4.5 mmol/L (ref 3.5–5.1)
Sodium: 141 mmol/L (ref 135–145)

## 2016-08-11 LAB — CBC
HEMATOCRIT: 36.5 % (ref 36.0–46.0)
Hemoglobin: 12.1 g/dL (ref 12.0–15.0)
MCH: 31.6 pg (ref 26.0–34.0)
MCHC: 33.2 g/dL (ref 30.0–36.0)
MCV: 95.3 fL (ref 78.0–100.0)
Platelets: 206 10*3/uL (ref 150–400)
RBC: 3.83 MIL/uL — ABNORMAL LOW (ref 3.87–5.11)
RDW: 11.9 % (ref 11.5–15.5)
WBC: 4.3 10*3/uL (ref 4.0–10.5)

## 2016-08-11 LAB — TYPE AND SCREEN
ABO/RH(D): B POS
ANTIBODY SCREEN: NEGATIVE

## 2016-08-11 LAB — GLUCOSE, CAPILLARY: Glucose-Capillary: 63 mg/dL — ABNORMAL LOW (ref 65–99)

## 2016-08-11 LAB — SURGICAL PCR SCREEN
MRSA, PCR: NEGATIVE
STAPHYLOCOCCUS AUREUS: POSITIVE — AB

## 2016-08-11 NOTE — Progress Notes (Signed)
[facility] [address], left message for patient with call back number for any questions

## 2016-08-11 NOTE — Progress Notes (Signed)
PCP - Ravisankar Avva - who also manages her DM Cardiologist - denies  Chest x-ray - not needed EKG - 06/13/16 Stress Test - denies ECHO - > 15 years ago Cardiac Cath - denies   Fasting Blood Sugar - 110-119 Checks Blood Sugar __many___ times a day  Sending to anesthesia for review of history Revonda Standardllison note 06/13/16 - patient also has an appointment tomorrow with PCP   Patient denies shortness of breath, fever, cough and chest pain at PAT appointment   Patient verbalized understanding of instructions that were given to them at the PAT appointment. Patient was also instructed that they will need to review over the PAT instructions again at home before surgery.

## 2016-08-11 NOTE — Pre-Procedure Instructions (Addendum)
Wynne DustDonna Lee Loughry  08/11/2016      [id number] [name] [place], [place] - [address] [place] [id number] Phone: [phone] Fax: [phone]  [facility] [place] - [id number] AT Portal to Registered [facility] Sites [address] [place] [id number] Phone: [phone] Fax: [phone]    Your procedure is scheduled on June 11  [facility] Ad[facility] at 0530 A.M.  Call this number if you have problems the morning of surgery:  [phone]   Remember:  Do not eat food or drink liquids after midnight.   Take these medicines the morning of surgery with A SIP OF WATER acetaminophen (TYLENOL) if needed, ALPRAZolam (XANAX),  buPROPion (WELLBUTRIN XL), desloratadine (CLARINEX), gabapentin (NEURONTIN), levothyroxine (SYNTHROID, LEVOTHROID), oxyCODONE-acetaminophen (PERCOCET) if needed,  7 days prior to surgery STOP taking any Aspirin, Aleve, Naproxen, Ibuprofen, Motrin, Advil, Goody's, BC's, all herbal medications, fish oil, and all vitamins  WHAT DO I DO ABOUT MY DIABETES MEDICATION?   Reduce Basal Rate by 20%  On insulin pump  How to Manage Your Diabetes Before and After Surgery  Why is it important to control my blood sugar before and after surgery? . Improving blood sugar levels before and after surgery helps healing and can limit problems. . A way of improving blood sugar control is eating a healthy diet by: o  Eating less sugar and carbohydrates o  Increasing activity/exercise o  Talking with your doctor about reaching your blood sugar goals . High blood sugars (greater than 180 mg/dL) can raise your risk of infections and slow your recovery, so you will need to focus on controlling your diabetes during the weeks before surgery. . Make sure that the doctor who takes care of your diabetes knows about your planned surgery including the date and location.  How do I manage my blood sugar before  surgery? . Check your blood sugar at least 4 times a day, starting 2 days before surgery, to make sure that the level is not too high or low. o Check your blood sugar the morning of your surgery when you wake up and every 2 hours until you get to the Short Stay unit. . If your blood sugar is less than 70 mg/dL, you will need to treat for low blood sugar: o Do not take insulin. o Treat a low blood sugar (less than 70 mg/dL) with  cup of clear juice (cranberry or apple), 4 glucose tablets, OR glucose gel. o Recheck blood sugar in 15 minutes after treatment (to make sure it is greater than 70 mg/dL). If your blood sugar is not greater than 70 mg/dL on recheck, call [phone] for further instructions. . Report your blood sugar to the short stay nurse when you get to Short Stay.  . If you are admitted to the hospital after surgery: o Your blood sugar will be checked by the staff and you will probably be given insulin after surgery (instead of oral diabetes medicines) to make sure you have good blood sugar levels. o The goal for blood sugar control after surgery is 80-180 mg/dL.    Do not wear jewelry, make-up or nail polish.  Do not wear lotions, powders, or perfumes, or deoderant.  Do not shave 48 hours prior to surgery.    Do not bring valuables to the hospital.  [facility] is not responsible for any belongings or valuables.  Contacts, dentures or bridgework may not be  worn into surgery.  Leave your suitcase in the car.  After surgery it may be brought to your room.  For patients admitted to the hospital, discharge time will be determined by your treatment team.  Patients discharged the day of surgery will not be allowed to drive home.    Special instructions:   [facility]- Preparing For Surgery  Before surgery, you can play an important role. Because skin is not sterile, your skin needs to be as free of germs as possible. You can reduce the number of germs on your skin by washing  with CHG (chlorahexidine gluconate) Soap before surgery.  CHG is an antiseptic cleaner which kills germs and bonds with the skin to continue killing germs even after washing.  Please do not use if you have an allergy to CHG or antibacterial soaps. If your skin becomes reddened/irritated stop using the CHG.  Do not shave (including legs and underarms) for at least 48 hours prior to first CHG shower. It is OK to shave your face.  Please follow these instructions carefully.   1. Shower the NIGHT BEFORE SURGERY and the MORNING OF SURGERY with CHG.   2. If you chose to wash your hair, wash your hair first as usual with your normal shampoo.  3. After you shampoo, rinse your hair and body thoroughly to remove the shampoo.  4. Use CHG as you would any other liquid soap. You can apply CHG directly to the skin and wash gently with a scrungie or a clean washcloth.   5. Apply the CHG Soap to your body ONLY FROM THE NECK DOWN.  Do not use on open wounds or open sores. Avoid contact with your eyes, ears, mouth and genitals (private parts). Wash genitals (private parts) with your normal soap.  6. Wash thoroughly, paying special attention to the area where your surgery will be performed.  7. Thoroughly rinse your body with warm water from the neck down.  8. DO NOT shower/wash with your normal soap after using and rinsing off the CHG Soap.  9. Pat yourself dry with a CLEAN TOWEL.   10. Wear CLEAN PAJAMAS   11. Place CLEAN SHEETS on your bed the night of your first shower and DO NOT SLEEP WITH PETS.    Day of Surgery: Do not apply any deodorants/lotions. Please wear clean clothes to the hospital/surgery center.      Please read over the following fact sheets that you were given.

## 2016-08-12 LAB — HEMOGLOBIN A1C
HEMOGLOBIN A1C: 6.1 % — AB (ref 4.8–5.6)
Mean Plasma Glucose: 128 mg/dL

## 2016-08-12 NOTE — Progress Notes (Signed)
Anesthesia follow-up: See my anesthesia note from 06/17/16. Anterior lateral lumbar interbody fusion L2-3, L3-4, L4-5, L2-5 pedicle screws fixation was initially scheduled for 06/23/16 by Dr. Newell CoralNudelman, but for unclear reasons was postponed and is now rescheduled for 08/18/16.   History includes former smoker (quit '93), post-operative N/V, hypothyroidism, arthritis, diabetes mellitus type 1 on insulin pump, depression, "functional murmur", dental crowns, ACDF 02/20/16.  Patient has an insulin pump and also a continuous glucose sensor monitor (Dexcom G5) that is located on her abdomen. (Patient would like to wear perioperatively and plans to bring in her receiver. However, in discussion with DME Boneta LucksJenny (diabetes RN educator), continuous glucose monitor (CGM) can be removed if it interferes with the operative field. It it does remain in place, patient would still need periodic hospital glucose monitoring to verify accuracy. Boneta LucksJenny also recommends a post-operative Hospitalist consult to assist with DM management (I have notified Nicki at Dr. Earl GalaNudelman's office).    PCP is Dr. Chilton [facility]ouseavis[facility]ar Avva [facility]   BP 140/61   Pulse 75   Temp 36.7 C   Resp 20   Ht 5' 1.5" (1.562 m)   Wt 170 lb 4.8 oz (77.2 kg)   SpO2 100%   BMI 31.66 kg/m    EKG 06/13/16: NSR, possible LAE. She reported an echo > 10 years ago.   MRI C-spine 05/27/16: IMPRESSION: 1. Interval C3-C5 ACDF. No significant residual spinal stenosis. 2. Advanced C5-6 disc degeneration with new degenerative endplate edema. Unchanged mild bilateral neural foraminal stenosis. 3. Unchanged mild spinal and moderate bilateral foraminal stenosis at C6-7.  Preoperative labs noted. Cr 0.84. H/H 12.1/36.5. A1c 6.1. T&S done.   If no acute changes then I would anticipate that she can proceed. Her surgery is posted for > 2 hours, so she will need IV insulin gtt started once insulin pump is removed. I have sent a consult to DM  educator.  Velna Ochsllison Zyriah Mask, PA-C [facility] Short Stay Center/Anesthesiology Phone [phone] 08/12/2016 5:28 PM

## 2016-08-18 ENCOUNTER — Encounter (HOSPITAL_COMMUNITY): Payer: Self-pay | Admitting: Certified Registered Nurse Anesthetist

## 2016-08-18 ENCOUNTER — Inpatient Hospital Stay (HOSPITAL_COMMUNITY)
Admission: RE | Admit: 2016-08-18 | Discharge: 2016-08-20 | DRG: 457 | Disposition: A | Discharge status: Home / Self Care | Payer: PRIVATE HEALTH INSURANCE | Source: Ambulatory Visit | Attending: Neurosurgery | Admitting: Neurosurgery

## 2016-08-18 ENCOUNTER — Inpatient Hospital Stay (HOSPITAL_COMMUNITY): Payer: PRIVATE HEALTH INSURANCE | Admitting: Vascular Surgery

## 2016-08-18 ENCOUNTER — Inpatient Hospital Stay (HOSPITAL_COMMUNITY): Payer: PRIVATE HEALTH INSURANCE

## 2016-08-18 ENCOUNTER — Encounter (HOSPITAL_COMMUNITY)
Admission: RE | Disposition: A | Discharge status: Home / Self Care | Payer: Self-pay | Source: Ambulatory Visit | Attending: Neurosurgery

## 2016-08-18 DIAGNOSIS — M48062 Spinal stenosis, lumbar region with neurogenic claudication: Principal | ICD-10-CM | POA: Diagnosis present

## 2016-08-18 DIAGNOSIS — Z419 Encounter for procedure for purposes other than remedying health state, unspecified: Secondary | ICD-10-CM

## 2016-08-18 DIAGNOSIS — Z9641 Presence of insulin pump (external) (internal): Secondary | ICD-10-CM | POA: Diagnosis present

## 2016-08-18 DIAGNOSIS — Z79899 Other long term (current) drug therapy: Secondary | ICD-10-CM | POA: Diagnosis not present

## 2016-08-18 DIAGNOSIS — M4156 Other secondary scoliosis, lumbar region: Secondary | ICD-10-CM | POA: Diagnosis present

## 2016-08-18 DIAGNOSIS — M4716 Other spondylosis with myelopathy, lumbar region: Secondary | ICD-10-CM | POA: Diagnosis present

## 2016-08-18 DIAGNOSIS — M48061 Spinal stenosis, lumbar region without neurogenic claudication: Secondary | ICD-10-CM | POA: Diagnosis present

## 2016-08-18 DIAGNOSIS — F329 Major depressive disorder, single episode, unspecified: Secondary | ICD-10-CM | POA: Diagnosis present

## 2016-08-18 DIAGNOSIS — E039 Hypothyroidism, unspecified: Secondary | ICD-10-CM | POA: Diagnosis present

## 2016-08-18 DIAGNOSIS — E1040 Type 1 diabetes mellitus with diabetic neuropathy, unspecified: Secondary | ICD-10-CM | POA: Diagnosis present

## 2016-08-18 DIAGNOSIS — Z794 Long term (current) use of insulin: Secondary | ICD-10-CM

## 2016-08-18 DIAGNOSIS — M5106 Intervertebral disc disorders with myelopathy, lumbar region: Secondary | ICD-10-CM | POA: Diagnosis present

## 2016-08-18 DIAGNOSIS — Z823 Family history of stroke: Secondary | ICD-10-CM

## 2016-08-18 DIAGNOSIS — Z79891 Long term (current) use of opiate analgesic: Secondary | ICD-10-CM

## 2016-08-18 DIAGNOSIS — Z87891 Personal history of nicotine dependence: Secondary | ICD-10-CM | POA: Diagnosis not present

## 2016-08-18 HISTORY — PX: ANTERIOR LAT LUMBAR FUSION: SHX1168

## 2016-08-18 LAB — GLUCOSE, CAPILLARY
Glucose-Capillary: 159 mg/dL — ABNORMAL HIGH (ref 65–99)
Glucose-Capillary: 111 mg/dL — ABNORMAL HIGH (ref 65–99)
Glucose-Capillary: 129 mg/dL — ABNORMAL HIGH (ref 65–99)
Glucose-Capillary: 113 mg/dL — ABNORMAL HIGH (ref 65–99)
Glucose-Capillary: 109 mg/dL — ABNORMAL HIGH (ref 65–99)
Glucose-Capillary: 110 mg/dL — ABNORMAL HIGH (ref 65–99)
Glucose-Capillary: 127 mg/dL — ABNORMAL HIGH (ref 65–99)
Glucose-Capillary: 133 mg/dL — ABNORMAL HIGH (ref 65–99)
Glucose-Capillary: 124 mg/dL — ABNORMAL HIGH (ref 65–99)
Glucose-Capillary: 231 mg/dL — ABNORMAL HIGH (ref 65–99)
Glucose-Capillary: 249 mg/dL — ABNORMAL HIGH (ref 65–99)
Glucose-Capillary: 230 mg/dL — ABNORMAL HIGH (ref 65–99)
Glucose-Capillary: 181 mg/dL — ABNORMAL HIGH (ref 65–99)

## 2016-08-18 SURGERY — ANTERIOR LATERAL LUMBAR FUSION 3 LEVELS
Anesthesia: General | Site: Spine Lumbar

## 2016-08-18 MED ORDER — THROMBIN 20000 UNITS EX SOLR
CUTANEOUS | Status: AC
  Filled 2016-08-18: qty 20000

## 2016-08-18 MED ORDER — LIDOCAINE 2% (20 MG/ML) 5 ML SYRINGE
INTRAMUSCULAR | Status: AC
  Filled 2016-08-18: qty 5

## 2016-08-18 MED ORDER — SCOPOLAMINE 1 MG/3DAYS TD PT72
MEDICATED_PATCH | TRANSDERMAL | Status: AC
  Filled 2016-08-18: qty 1

## 2016-08-18 MED ORDER — CHLORHEXIDINE GLUCONATE CLOTH 2 % EX PADS: 6.0000 | MEDICATED_PAD | Freq: Once | CUTANEOUS | Status: DC

## 2016-08-18 MED ORDER — BUPIVACAINE HCL (PF) 0.25 % IJ SOLN
INTRAMUSCULAR | Status: AC
  Filled 2016-08-18: qty 30

## 2016-08-18 MED ORDER — SODIUM CHLORIDE 0.9% FLUSH: 3.0000 mL | Freq: Two times a day (BID) | INTRAVENOUS | Status: DC

## 2016-08-18 MED ORDER — THROMBIN 5000 UNITS EX SOLR
CUTANEOUS | Status: AC
  Filled 2016-08-18: qty 5000

## 2016-08-18 MED ORDER — VANCOMYCIN HCL IN DEXTROSE 1-5 GM/200ML-% IV SOLN
1000.0000 mg | INTRAVENOUS | Status: AC
  Administered 2016-08-18: 1000 mg via INTRAVENOUS
  Filled 2016-08-18: qty 200

## 2016-08-18 MED ORDER — LIDOCAINE-EPINEPHRINE 1 %-1:100000 IJ SOLN
INTRAMUSCULAR | Status: AC
  Filled 2016-08-18: qty 1

## 2016-08-18 MED ORDER — MIDAZOLAM HCL 2 MG/2ML IJ SOLN
INTRAMUSCULAR | Status: AC
  Filled 2016-08-18: qty 2

## 2016-08-18 MED ORDER — LACTATED RINGERS IV SOLN
INTRAVENOUS | Status: DC | PRN
  Administered 2016-08-18 (×3): via INTRAVENOUS

## 2016-08-18 MED ORDER — KETOROLAC TROMETHAMINE 30 MG/ML IJ SOLN
15.0000 mg | Freq: Four times a day (QID) | INTRAMUSCULAR | Status: DC
Start: 2016-08-18 — End: 2016-08-20
  Administered 2016-08-18 – 2016-08-20 (×6): 15 mg via INTRAVENOUS
  Filled 2016-08-18 (×6): qty 1

## 2016-08-18 MED ORDER — ONDANSETRON HCL 4 MG/2ML IJ SOLN
INTRAMUSCULAR | Status: AC
  Filled 2016-08-18: qty 2

## 2016-08-18 MED ORDER — EPHEDRINE SULFATE-NACL 50-0.9 MG/10ML-% IV SOSY
PREFILLED_SYRINGE | INTRAVENOUS | Status: DC | PRN
  Administered 2016-08-18 (×2): 5 mg via INTRAVENOUS

## 2016-08-18 MED ORDER — PHENYLEPHRINE HCL 10 MG/ML IJ SOLN
INTRAMUSCULAR | Status: AC
  Filled 2016-08-18: qty 1

## 2016-08-18 MED ORDER — HYDROMORPHONE HCL 1 MG/ML IJ SOLN
INTRAMUSCULAR | Status: AC
  Filled 2016-08-18: qty 0.5

## 2016-08-18 MED ORDER — ONDANSETRON HCL 4 MG/2ML IJ SOLN
4.0000 mg | Freq: Four times a day (QID) | INTRAMUSCULAR | Status: DC | PRN
  Administered 2016-08-19: 4 mg via INTRAVENOUS
  Filled 2016-08-18: qty 2

## 2016-08-18 MED ORDER — GABAPENTIN 100 MG PO CAPS: 100.0000 mg | ORAL_CAPSULE | Freq: Three times a day (TID) | ORAL | Status: DC

## 2016-08-18 MED ORDER — FLEET ENEMA 7-19 GM/118ML RE ENEM: 1.0000 | ENEMA | Freq: Once | RECTAL | Status: DC | PRN

## 2016-08-18 MED ORDER — 0.9 % SODIUM CHLORIDE (POUR BTL) OPTIME
TOPICAL | Status: DC | PRN
  Administered 2016-08-18: 1000 mL

## 2016-08-18 MED ORDER — SCOPOLAMINE 1 MG/3DAYS TD PT72
MEDICATED_PATCH | TRANSDERMAL | Status: DC | PRN
  Administered 2016-08-18: 1 via TRANSDERMAL

## 2016-08-18 MED ORDER — SODIUM CHLORIDE 0.9 % IV SOLN
INTRAVENOUS | Status: AC
  Administered 2016-08-18: 2 [IU]/h via INTRAVENOUS
  Filled 2016-08-18: qty 1

## 2016-08-18 MED ORDER — SODIUM CHLORIDE 0.9% FLUSH: 3.0000 mL | INTRAVENOUS | Status: DC | PRN

## 2016-08-18 MED ORDER — MIDAZOLAM HCL 5 MG/5ML IJ SOLN
INTRAMUSCULAR | Status: DC | PRN
  Administered 2016-08-18: 2 mg via INTRAVENOUS

## 2016-08-18 MED ORDER — FENTANYL CITRATE (PF) 100 MCG/2ML IJ SOLN
INTRAMUSCULAR | Status: DC | PRN
  Administered 2016-08-18 (×4): 50 ug via INTRAVENOUS
  Administered 2016-08-18: 100 ug via INTRAVENOUS
  Administered 2016-08-18 (×5): 50 ug via INTRAVENOUS

## 2016-08-18 MED ORDER — FENTANYL CITRATE (PF) 250 MCG/5ML IJ SOLN
INTRAMUSCULAR | Status: AC
  Filled 2016-08-18: qty 5

## 2016-08-18 MED ORDER — ALPRAZOLAM 0.25 MG PO TABS: 0.2500 mg | ORAL_TABLET | Freq: Every evening | ORAL | Status: DC | PRN

## 2016-08-18 MED ORDER — INSULIN PUMP
Freq: Three times a day (TID) | SUBCUTANEOUS | Status: DC
  Administered 2016-08-19: 1 via SUBCUTANEOUS
  Administered 2016-08-19 (×2): via SUBCUTANEOUS
  Administered 2016-08-19: 3.2 via SUBCUTANEOUS
  Administered 2016-08-20 (×2): via SUBCUTANEOUS
  Filled 2016-08-18: qty 1

## 2016-08-18 MED ORDER — BUPIVACAINE HCL (PF) 0.5 % IJ SOLN
INTRAMUSCULAR | Status: AC
  Filled 2016-08-18: qty 30

## 2016-08-18 MED ORDER — MEPERIDINE HCL 25 MG/ML IJ SOLN: 6.2500 mg | INTRAMUSCULAR | Status: DC | PRN

## 2016-08-18 MED ORDER — DEXTROSE 5 % IV SOLN
INTRAVENOUS | Status: DC | PRN
  Administered 2016-08-18: 80 mg via INTRAVENOUS

## 2016-08-18 MED ORDER — BUPROPION HCL ER (XL) 300 MG PO TB24
300.0000 mg | ORAL_TABLET | Freq: Every day | ORAL | Status: DC
  Administered 2016-08-19 – 2016-08-20 (×2): 300 mg via ORAL
  Filled 2016-08-18 (×2): qty 1

## 2016-08-18 MED ORDER — MENTHOL 3 MG MT LOZG: 1.0000 | LOZENGE | OROMUCOSAL | Status: DC | PRN

## 2016-08-18 MED ORDER — BISACODYL 10 MG RE SUPP: 10.0000 mg | Freq: Every day | RECTAL | Status: DC | PRN

## 2016-08-18 MED ORDER — ARTIFICIAL TEARS OPHTHALMIC OINT
TOPICAL_OINTMENT | OPHTHALMIC | Status: AC
  Filled 2016-08-18: qty 3.5

## 2016-08-18 MED ORDER — ACETAMINOPHEN 10 MG/ML IV SOLN
INTRAVENOUS | Status: AC
  Filled 2016-08-18: qty 100

## 2016-08-18 MED ORDER — GENTAMICIN IN SALINE 1.6-0.9 MG/ML-% IV SOLN
INTRAVENOUS | Status: AC
  Filled 2016-08-18: qty 50

## 2016-08-18 MED ORDER — SODIUM CHLORIDE 0.9 % IV SOLN: 250.0000 mL | INTRAVENOUS | Status: DC

## 2016-08-18 MED ORDER — ARTIFICIAL TEARS OPHTHALMIC OINT
TOPICAL_OINTMENT | OPHTHALMIC | Status: DC | PRN
  Administered 2016-08-18: 1 via OPHTHALMIC

## 2016-08-18 MED ORDER — ROCURONIUM BROMIDE 10 MG/ML (PF) SYRINGE
PREFILLED_SYRINGE | INTRAVENOUS | Status: AC
  Filled 2016-08-18: qty 5

## 2016-08-18 MED ORDER — GABAPENTIN 100 MG PO CAPS
100.0000 mg | ORAL_CAPSULE | Freq: Two times a day (BID) | ORAL | Status: DC
  Administered 2016-08-19 – 2016-08-20 (×3): 100 mg via ORAL
  Filled 2016-08-18 (×3): qty 1

## 2016-08-18 MED ORDER — PHENYLEPHRINE 40 MCG/ML (10ML) SYRINGE FOR IV PUSH (FOR BLOOD PRESSURE SUPPORT)
PREFILLED_SYRINGE | INTRAVENOUS | Status: AC
  Filled 2016-08-18: qty 10

## 2016-08-18 MED ORDER — HYDROXYZINE HCL 50 MG/ML IM SOLN
50.0000 mg | INTRAMUSCULAR | Status: DC | PRN
  Administered 2016-08-19: 50 mg via INTRAMUSCULAR
  Filled 2016-08-18: qty 1

## 2016-08-18 MED ORDER — MAGNESIUM HYDROXIDE 400 MG/5ML PO SUSP: 30.0000 mL | Freq: Every day | ORAL | Status: DC | PRN

## 2016-08-18 MED ORDER — KETOROLAC TROMETHAMINE 30 MG/ML IJ SOLN
15.0000 mg | Freq: Once | INTRAMUSCULAR | Status: AC
  Administered 2016-08-18: 15 mg via INTRAVENOUS

## 2016-08-18 MED ORDER — CYCLOBENZAPRINE HCL 5 MG PO TABS
5.0000 mg | ORAL_TABLET | Freq: Three times a day (TID) | ORAL | Status: DC | PRN
  Administered 2016-08-19 (×2): 5 mg via ORAL
  Filled 2016-08-18 (×2): qty 1

## 2016-08-18 MED ORDER — PHENYLEPHRINE 40 MCG/ML (10ML) SYRINGE FOR IV PUSH (FOR BLOOD PRESSURE SUPPORT)
PREFILLED_SYRINGE | INTRAVENOUS | Status: DC | PRN
  Administered 2016-08-18: 120 ug via INTRAVENOUS
  Administered 2016-08-18 (×2): 80 ug via INTRAVENOUS
  Administered 2016-08-18: 120 ug via INTRAVENOUS

## 2016-08-18 MED ORDER — ONDANSETRON HCL 4 MG/2ML IJ SOLN
4.0000 mg | Freq: Once | INTRAMUSCULAR | Status: AC | PRN
  Administered 2016-08-18: 4 mg via INTRAVENOUS

## 2016-08-18 MED ORDER — DEXTROSE 5 % IV SOLN
INTRAVENOUS | Status: DC | PRN
  Administered 2016-08-18 (×2): via INTRAVENOUS
  Administered 2016-08-18: 50 ug/min via INTRAVENOUS

## 2016-08-18 MED ORDER — ONDANSETRON HCL 4 MG PO TABS: 4.0000 mg | ORAL_TABLET | Freq: Four times a day (QID) | ORAL | Status: DC | PRN

## 2016-08-18 MED ORDER — MORPHINE SULFATE (PF) 4 MG/ML IV SOLN
4.0000 mg | INTRAVENOUS | Status: DC | PRN
  Administered 2016-08-19: 4 mg via INTRAMUSCULAR
  Filled 2016-08-18: qty 1

## 2016-08-18 MED ORDER — LACTATED RINGERS IV SOLN
INTRAVENOUS | Status: DC | PRN
  Administered 2016-08-18: 07:00:00 via INTRAVENOUS

## 2016-08-18 MED ORDER — KETOROLAC TROMETHAMINE 15 MG/ML IJ SOLN
INTRAMUSCULAR | Status: AC
  Filled 2016-08-18: qty 1

## 2016-08-18 MED ORDER — HYDROXYZINE HCL 25 MG PO TABS
50.0000 mg | ORAL_TABLET | ORAL | Status: DC | PRN
  Administered 2016-08-19: 50 mg via ORAL
  Filled 2016-08-18: qty 2

## 2016-08-18 MED ORDER — HYDROMORPHONE HCL 1 MG/ML IJ SOLN
0.2500 mg | INTRAMUSCULAR | Status: DC | PRN
  Administered 2016-08-18: 0.5 mg via INTRAVENOUS

## 2016-08-18 MED ORDER — ALUM & MAG HYDROXIDE-SIMETH 200-200-20 MG/5ML PO SUSP: 30.0000 mL | Freq: Four times a day (QID) | ORAL | Status: DC | PRN

## 2016-08-18 MED ORDER — OXYCODONE-ACETAMINOPHEN 5-325 MG PO TABS
1.0000 | ORAL_TABLET | ORAL | Status: DC | PRN
  Administered 2016-08-18 – 2016-08-20 (×5): 1 via ORAL
  Filled 2016-08-18 (×6): qty 1

## 2016-08-18 MED ORDER — SODIUM CHLORIDE 0.9 % IV SOLN: INTRAVENOUS | Status: DC

## 2016-08-18 MED ORDER — PROPOFOL 10 MG/ML IV BOLUS
INTRAVENOUS | Status: AC
  Filled 2016-08-18: qty 20

## 2016-08-18 MED ORDER — EPHEDRINE 5 MG/ML INJ
INTRAVENOUS | Status: AC
  Filled 2016-08-18: qty 10

## 2016-08-18 MED ORDER — LIDOCAINE-EPINEPHRINE 1 %-1:100000 IJ SOLN
INTRAMUSCULAR | Status: DC | PRN
  Administered 2016-08-18: 18 mL
  Administered 2016-08-18: 17 mL
  Administered 2016-08-18: 20 mL

## 2016-08-18 MED ORDER — ATORVASTATIN CALCIUM 10 MG PO TABS
10.0000 mg | ORAL_TABLET | Freq: Every day | ORAL | Status: DC
  Administered 2016-08-19: 10 mg via ORAL
  Filled 2016-08-18 (×3): qty 1

## 2016-08-18 MED ORDER — PHENOL 1.4 % MT LIQD: 1.0000 | OROMUCOSAL | Status: DC | PRN

## 2016-08-18 MED ORDER — BUPIVACAINE HCL (PF) 0.5 % IJ SOLN
INTRAMUSCULAR | Status: DC | PRN
  Administered 2016-08-18: 18 mL
  Administered 2016-08-18: 17 mL
  Administered 2016-08-18: 20 mL

## 2016-08-18 MED ORDER — LIDOCAINE 2% (20 MG/ML) 5 ML SYRINGE
INTRAMUSCULAR | Status: DC | PRN
  Administered 2016-08-18: 100 mg via INTRAVENOUS

## 2016-08-18 MED ORDER — THROMBIN 5000 UNITS EX SOLR
CUTANEOUS | Status: DC | PRN
  Administered 2016-08-18: 5 mL via TOPICAL

## 2016-08-18 MED ORDER — SUCCINYLCHOLINE CHLORIDE 200 MG/10ML IV SOSY
PREFILLED_SYRINGE | INTRAVENOUS | Status: DC | PRN
  Administered 2016-08-18: 120 mg via INTRAVENOUS

## 2016-08-18 MED ORDER — LEVOTHYROXINE SODIUM 137 MCG PO TABS
137.0000 ug | ORAL_TABLET | Freq: Every day | ORAL | Status: DC
  Administered 2016-08-19 – 2016-08-20 (×2): 137 ug via ORAL
  Filled 2016-08-18 (×2): qty 1

## 2016-08-18 MED ORDER — ONDANSETRON HCL 4 MG/2ML IJ SOLN
INTRAMUSCULAR | Status: DC | PRN
  Administered 2016-08-18: 4 mg via INTRAVENOUS

## 2016-08-18 MED ORDER — SUCCINYLCHOLINE CHLORIDE 200 MG/10ML IV SOSY
PREFILLED_SYRINGE | INTRAVENOUS | Status: AC
  Filled 2016-08-18: qty 10

## 2016-08-18 MED ORDER — PROPOFOL 10 MG/ML IV BOLUS
INTRAVENOUS | Status: DC | PRN
  Administered 2016-08-18: 30 mg via INTRAVENOUS
  Administered 2016-08-18: 120 mg via INTRAVENOUS

## 2016-08-18 MED ORDER — POTASSIUM GLUCONATE 595 (99 K) MG PO TABS: 595.0000 mg | ORAL_TABLET | Freq: Every day | ORAL | Status: DC

## 2016-08-18 MED ORDER — ACETAMINOPHEN 325 MG PO TABS: 650.0000 mg | ORAL_TABLET | ORAL | Status: DC | PRN

## 2016-08-18 MED ORDER — ACETAMINOPHEN 10 MG/ML IV SOLN
INTRAVENOUS | Status: DC | PRN
  Administered 2016-08-18: 1000 mg via INTRAVENOUS

## 2016-08-18 MED ORDER — THROMBIN 20000 UNITS EX SOLR
CUTANEOUS | Status: DC | PRN
  Administered 2016-08-18: 20 mL via TOPICAL

## 2016-08-18 MED ORDER — ACETAMINOPHEN 650 MG RE SUPP: 650.0000 mg | RECTAL | Status: DC | PRN

## 2016-08-18 MED ORDER — SODIUM CHLORIDE 0.9 % IR SOLN
Status: DC | PRN
  Administered 2016-08-18: 500 mL

## 2016-08-18 MED ORDER — GABAPENTIN 300 MG PO CAPS
300.0000 mg | ORAL_CAPSULE | Freq: Every day | ORAL | Status: DC
  Administered 2016-08-18 – 2016-08-19 (×2): 300 mg via ORAL
  Filled 2016-08-18 (×2): qty 1

## 2016-08-18 MED ORDER — IRBESARTAN 300 MG PO TABS
300.0000 mg | ORAL_TABLET | Freq: Every day | ORAL | Status: DC
  Administered 2016-08-19 – 2016-08-20 (×2): 300 mg via ORAL
  Filled 2016-08-18 (×2): qty 1

## 2016-08-18 SURGICAL SUPPLY — 99 items
ADH SKN CLS APL DERMABOND .7 (GAUZE/BANDAGES/DRESSINGS) ×12
BAG DECANTER FOR FLEXI CONT (MISCELLANEOUS) ×3 IMPLANT
BLADE CLIPPER SURG (BLADE) IMPLANT
BLADE SURG 10 STRL SS (BLADE) ×2 IMPLANT
BLADE SURG 15 STRL LF DISP TIS (BLADE) IMPLANT
BLADE SURG 15 STRL SS (BLADE) ×3
BUR ACRON 5.0MM COATED (BURR) ×3 IMPLANT
BUR MATCHSTICK NEURO 3.0 LAGG (BURR) ×3 IMPLANT
CANISTER SUCT 3000ML PPV (MISCELLANEOUS) ×3 IMPLANT
CARTRIDGE OIL MAESTRO DRILL (MISCELLANEOUS) ×4 IMPLANT
CLIP NEUROVISION LG (CLIP) ×1 IMPLANT
CONT SPEC 4OZ CLIKSEAL STRL BL (MISCELLANEOUS) ×3 IMPLANT
CORDS BIPOLAR (ELECTRODE) ×1 IMPLANT
COROENT XL-W 10X22X50 (Orthopedic Implant) ×1 IMPLANT
COROENT XL-W 8X22X50 (Orthopedic Implant) ×1 IMPLANT
COUNTER NEEDLE 20 DBL MAG RED (NEEDLE) ×1 IMPLANT
COVER BACK TABLE 24X17X13 BIG (DRAPES) IMPLANT
COVER BACK TABLE 60X90IN (DRAPES) ×3 IMPLANT
DECANTER SPIKE VIAL GLASS SM (MISCELLANEOUS) ×1 IMPLANT
DERMABOND ADVANCED (GAUZE/BANDAGES/DRESSINGS) ×6
DERMABOND ADVANCED .7 DNX12 (GAUZE/BANDAGES/DRESSINGS) ×6 IMPLANT
DIFFUSER DRILL AIR PNEUMATIC (MISCELLANEOUS) ×5 IMPLANT
DIGITIZER BENDINI (MISCELLANEOUS) ×1 IMPLANT
DRAPE C-ARM 42X72 X-RAY (DRAPES) ×8 IMPLANT
DRAPE C-ARMOR (DRAPES) ×4 IMPLANT
DRAPE HALF SHEET 40X57 (DRAPES) ×1 IMPLANT
DRAPE INCISE IOBAN 66X45 STRL (DRAPES) ×1 IMPLANT
DRAPE LAPAROTOMY 100X72X124 (DRAPES) ×7 IMPLANT
DRAPE POUCH INSTRU U-SHP 10X18 (DRAPES) ×7 IMPLANT
DRAPE SURG 17X23 STRL (DRAPES) ×4 IMPLANT
DURAPREP 26ML APPLICATOR (WOUND CARE) ×2 IMPLANT
ELECT REM PT RETURN 9FT ADLT (ELECTROSURGICAL) ×3
ELECTRODE REM PT RTRN 9FT ADLT (ELECTROSURGICAL) ×4 IMPLANT
GAUZE SPONGE 4X4 12PLY STRL (GAUZE/BANDAGES/DRESSINGS) ×2 IMPLANT
GAUZE SPONGE 4X4 12PLY STRL LF (GAUZE/BANDAGES/DRESSINGS) ×1 IMPLANT
GAUZE SPONGE 4X4 16PLY XRAY LF (GAUZE/BANDAGES/DRESSINGS) ×2 IMPLANT
GLOVE BIOGEL PI IND STRL 8 (GLOVE) ×6 IMPLANT
GLOVE BIOGEL PI INDICATOR 8 (GLOVE) ×4
GLOVE ECLIPSE 7.5 STRL STRAW (GLOVE) ×12 IMPLANT
GLOVE EXAM NITRILE LRG STRL (GLOVE) IMPLANT
GLOVE EXAM NITRILE XL STR (GLOVE) IMPLANT
GLOVE EXAM NITRILE XS STR PU (GLOVE) IMPLANT
GOWN STRL REUS W/ TWL LRG LVL3 (GOWN DISPOSABLE) IMPLANT
GOWN STRL REUS W/ TWL XL LVL3 (GOWN DISPOSABLE) ×8 IMPLANT
GOWN STRL REUS W/TWL 2XL LVL3 (GOWN DISPOSABLE) ×2 IMPLANT
GOWN STRL REUS W/TWL LRG LVL3 (GOWN DISPOSABLE)
GOWN STRL REUS W/TWL XL LVL3 (GOWN DISPOSABLE) ×6
GUIDEWIRE NITINOL BEVEL TIP (WIRE) ×6 IMPLANT
IMPL COROENT LDTXL 10X18X55 (Cage) IMPLANT
IMPLANT COROENT LDTXL 10X18X55 (Cage) ×3 IMPLANT
KIT BASIN OR (CUSTOM PROCEDURE TRAY) ×5 IMPLANT
KIT DILATOR XLIF 5 (KITS) IMPLANT
KIT INFUSE SMALL (Orthopedic Implant) ×1 IMPLANT
KIT ROOM TURNOVER OR (KITS) ×6 IMPLANT
KIT SURGICAL ACCESS MAXCESS 4 (KITS) ×1 IMPLANT
KIT XLIF (KITS) ×1
MARKER SKIN DUAL TIP RULER LAB (MISCELLANEOUS) ×3 IMPLANT
MODULE NVM5 NEXT GEN EMG (NEEDLE) ×1 IMPLANT
NDL 18GX1X1/2 (RX/OR ONLY) (NEEDLE) ×2 IMPLANT
NDL HYPO 25X1 1.5 SAFETY (NEEDLE) ×4 IMPLANT
NDL I-PASS III (NEEDLE) IMPLANT
NDL SPNL 18GX3.5 QUINCKE PK (NEEDLE) ×2 IMPLANT
NDL SPNL 22GX3.5 QUINCKE BK (NEEDLE) ×2 IMPLANT
NEEDLE 18GX1X1/2 (RX/OR ONLY) (NEEDLE) ×3 IMPLANT
NEEDLE HYPO 25X1 1.5 SAFETY (NEEDLE) ×3 IMPLANT
NEEDLE I-PASS III (NEEDLE) ×3 IMPLANT
NEEDLE SPNL 18GX3.5 QUINCKE PK (NEEDLE) ×3 IMPLANT
NEEDLE SPNL 22GX3.5 QUINCKE BK (NEEDLE) ×6 IMPLANT
NS IRRIG 1000ML POUR BTL (IV SOLUTION) ×7 IMPLANT
OIL CARTRIDGE MAESTRO DRILL (MISCELLANEOUS) ×3
PACK LAMINECTOMY NEURO (CUSTOM PROCEDURE TRAY) ×5 IMPLANT
PAD ARMBOARD 7.5X6 YLW CONV (MISCELLANEOUS) ×11 IMPLANT
PATTIES SURGICAL .5 X.5 (GAUZE/BANDAGES/DRESSINGS) IMPLANT
PATTIES SURGICAL .5 X1 (DISPOSABLE) IMPLANT
PATTIES SURGICAL 1X1 (DISPOSABLE) IMPLANT
PENCIL BUTTON HOLSTER BLD 10FT (ELECTRODE) ×1 IMPLANT
ROD RELINE MAS ST 5.5X300MM (Rod) ×2 IMPLANT
SCREW LOCK RELINE 5.5 TULIP (Screw) ×7 IMPLANT
SCREW RELINE MAS RED 5.5X45MM (Screw) ×3 IMPLANT
SCREW SPINAL REDUCE 5.5X40 C2 (Screw) ×3 IMPLANT
SPONGE LAP 4X18 X RAY DECT (DISPOSABLE) ×2 IMPLANT
SPONGE NEURO XRAY DETECT 1X3 (DISPOSABLE) IMPLANT
SPONGE SURGIFOAM ABS GEL 100 (HEMOSTASIS) ×3 IMPLANT
STAPLER PROXIMATE FIRING4 30MM (STAPLE) ×1 IMPLANT
STRIP BIOACTIVE VITOSS 25X52X4 (Orthopedic Implant) ×3 IMPLANT
SUT PROLENE 6 0 BV (SUTURE) IMPLANT
SUT VIC AB 1 CT1 18XBRD ANBCTR (SUTURE) ×4 IMPLANT
SUT VIC AB 1 CT1 8-18 (SUTURE) ×12
SUT VIC AB 2-0 CP2 18 (SUTURE) ×14 IMPLANT
SUT VIC AB 3-0 SH 8-18 (SUTURE) ×3 IMPLANT
SYR 3ML LL SCALE MARK (SYRINGE) IMPLANT
SYR CONTROL 10ML LL (SYRINGE) ×4 IMPLANT
TAPE CLOTH SURG 6X10 WHT LF (GAUZE/BANDAGES/DRESSINGS) ×2 IMPLANT
TOWEL GREEN STERILE (TOWEL DISPOSABLE) ×5 IMPLANT
TOWEL GREEN STERILE FF (TOWEL DISPOSABLE) ×5 IMPLANT
TRAP SPECIMEN MUCOUS 40CC (MISCELLANEOUS) IMPLANT
TRAY FOLEY W/METER SILVER 16FR (SET/KITS/TRAYS/PACK) ×5 IMPLANT
TUBE CONNECTING 12X1/4 (SUCTIONS) ×1 IMPLANT
WATER STERILE IRR 1000ML POUR (IV SOLUTION) ×5 IMPLANT

## 2016-08-18 NOTE — Progress Notes (Signed)
Per Dr. Deirdre Priestssey's order, patient's insulin pump and continuous glucose monitor removed by patient and given to daughter.

## 2016-08-18 NOTE — Anesthesia Procedure Notes (Signed)
Procedure Name: Intubation Date/Time: 08/18/2016 7:48 AM Performed by: Rise PatienceBELL, Tichina Koebel T Pre-anesthesia Checklist: Patient identified, Emergency Drugs available, Suction available and Patient being monitored Patient Re-evaluated:Patient Re-evaluated prior to inductionOxygen Delivery Method: Circle System Utilized Preoxygenation: Pre-oxygenation with 100% oxygen Intubation Type: IV induction Ventilation: Mask ventilation without difficulty Laryngoscope Size: Miller and 2 Grade View: Grade I Tube type: Oral Tube size: 7.5 mm Number of attempts: 1 Airway Equipment and Method: Stylet and Oral airway Placement Confirmation: ETT inserted through vocal cords under direct vision,  positive ETCO2 and breath sounds checked- equal and bilateral Secured at: 21 cm Tube secured with: Tape Dental Injury: Teeth and Oropharynx as per pre-operative assessment

## 2016-08-18 NOTE — Transfer of Care (Signed)
Immediate Anesthesia Transfer of Care Note  Patient: Colleen Zuniga  Procedure(s) Performed: Procedure(s) with comments: ANTERIOR LATERAL LUMBAR FUSION LUMBAR TWO- LUMBAR THREE, LUMBAR THREE- LUMBAR FOUR, LUMBAR FOUR- LUMBAR FIVE; LUMBAR TWO- LUMBAR FIVE PEDICLE SCREW FIXATION (N/A) - ANTERIOR LATERAL LUMBAR FUSION LUMBAR 2- LUMBAR 3, LUMBAR 3- LUMBAR 4-, LUMBAR 4- LUMBAR 5; LUMBAR 2- LUMBAR 5 PEDICLE SCREW FIXATION POSTERIOR LUMBAR FUSION 3 LEVEL (N/A)  Patient Location: PACU  Anesthesia Type:General  Level of Consciousness: awake and patient cooperative  Airway & Oxygen Therapy: Patient Spontanous Breathing and Patient connected to face mask oxygen  Post-op Assessment: Report given to RN and Post -op Vital signs reviewed and stable  Post vital signs: Reviewed and stable  Last Vitals:  Vitals:   08/18/16 0658 08/18/16 1538  BP: 133/65   Pulse: 87   Resp: 18   Temp: 36.6 C 36.3 C    Last Pain:  Vitals:   08/18/16 0658  TempSrc: Oral  PainSc: 7       Patients Stated Pain Goal: 4 (08/18/16 16100658)  Complications: No apparent anesthesia complications

## 2016-08-18 NOTE — Anesthesia Preprocedure Evaluation (Addendum)
Anesthesia Evaluation  Patient identified by MRN, date of birth, ID band Patient awake    Reviewed: Allergy & Precautions, NPO status , Patient's Chart, lab work & pertinent test results  History of Anesthesia Complications (+) PONV and history of anesthetic complications  Airway Mallampati: II  TM Distance: >3 FB Neck ROM: Limited    Dental  (+) Teeth Intact, Dental Advisory Given   Pulmonary former smoker,    Pulmonary exam normal        Cardiovascular Normal cardiovascular exam     Neuro/Psych Depression    GI/Hepatic   Endo/Other  diabetes, Type 1, Insulin DependentHypothyroidism   Renal/GU      Musculoskeletal  (+) Arthritis ,   Abdominal   Peds  Hematology   Anesthesia Other Findings   Reproductive/Obstetrics                            Anesthesia Physical Anesthesia Plan  ASA: III  Anesthesia Plan: General   Post-op Pain Management:    Induction: Intravenous  PONV Risk Score and Plan: 4 or greater and Ondansetron, Dexamethasone, Propofol, Midazolam, Scopolamine patch - Pre-op and Diphenhydramine  Airway Management Planned: Oral ETT  Additional Equipment:   Intra-op Plan:   Post-operative Plan: Extubation in OR  Informed Consent: I have reviewed the patients History and Physical, chart, labs and discussed the procedure including the risks, benefits and alternatives for the proposed anesthesia with the patient or authorized representative who has indicated his/her understanding and acceptance.   Dental advisory given  Plan Discussed with: CRNA and Surgeon  Anesthesia Plan Comments:        Anesthesia Quick Evaluation

## 2016-08-18 NOTE — Progress Notes (Signed)
Vitals:   08/18/16 1722 08/18/16 1730 08/18/16 1737 08/18/16 1824  BP: (!) 104/37  (!) 104/39 (!) 121/49  Pulse: 80 85 78 82  Resp: 16 12 13 16   Temp:  98.3 F (36.8 C)  97.7 F (36.5 C)  TempSrc:      SpO2: 100% 100% 100% 95%  Weight:      Height:        Patient resting comfortably in bed; drowsy, but easily aroused by voice. Dressing clean and dry. Foley to straight drainage. Blood sugars in mid 200s, currently receiving bolus feeding her insulin pump, we'll recheck after bolus completed.  Patient describes some back discomfort as well as some abdominal discomfort. Last pain medication was in PACU.  Plan: We will plan on beginning to ambulate once more alert. Continue to progress through postoperative recovery.  Hewitt ShortsNUDELMAN,ROBERT W, MD 08/18/2016, 7:01 PM

## 2016-08-18 NOTE — H&P (Signed)
Subjective: Patient is a 56 y.o. right-handed white female who is admitted for treatment of advanced lumbar spondylosis and degenerative disc disease with resulting significant scoliotic deformity and significant stenosis at the L3-4 and L4-5 levels.   Symptomatically she's had increasingly disabling back pain, along with tingling through her distal lower extremities, and a sense of weakness in her lower extremities. Patient had also been found to have advanced degenerative changes in the cervical spine, with evidence of cervical myelopathy and is undergone decompression via 2 level C3-4 and C4-5 ACDF 6 months ago. It is felt that some of her symptoms are related to her residual cervical myelopathy, as well as to diabetic neuropathy. She is admitted now for decompression and stabilization in the lumbar spine via a 3 level L2-3, L3-4, and L4-5 anterolateral interbody arthrodesis (XLIF) and a L2-5 percutaneous pedicle screw fixation.   Patient Active Problem List   Diagnosis Date Noted  . Arthritis   . Hypokalemia   . Chest pain    Past Medical History:  Diagnosis Date  . Arthritis   . De Quervain's tenosynovitis, right 01/2012  . Degenerative disc disease, cervical    states neck is stiff, reduced range of motion  . Dental crowns present   . Depression   . Diabetes mellitus    Insulin pump  . Heart murmur    states has a functional murmur, and that she has never had any problems  . Hypothyroidism   . PONV (postoperative nausea and vomiting)     Past Surgical History:  Procedure Laterality Date  . ANTERIOR CERVICAL DISCECTOMY  02/20/2016  . CESAREAN SECTION    . DORSAL COMPARTMENT RELEASE  02/08/2002   first dorsal compartment left wrist  . DORSAL COMPARTMENT RELEASE  02/03/2012   Procedure: RELEASE DORSAL COMPARTMENT (DEQUERVAIN);  Surgeon: Nicki Reaper, MD;  Location: [facility];  Service: Orthopedics;  Laterality: Right;  RELEASE DEQUERVAINS RIGHT WRIST  . KNEE  ARTHROSCOPY  10/14/2004   right  . SHOULDER ARTHROSCOPY  05/05/2005   left  . TRIGGER FINGER RELEASE  01/07/2011   release A1 pulley left thumb  . TRIGGER FINGER RELEASE     x 5 other fingers    Prescriptions Prior to Admission  Medication Sig Dispense Refill Last Dose  . acetaminophen (TYLENOL) 500 MG tablet Take 1,000 mg by mouth every 6 (six) hours as needed for mild pain.    08/17/2016 at Unknown time  . ALPRAZolam (XANAX) 0.5 MG tablet Take 0.25-0.5 mg by mouth at bedtime as needed for sleep or anxiety.  3 08/18/2016 at 0315  . Ascorbic Acid (VITAMIN C WITH ROSE HIPS) 1000 MG tablet Take 1,000 mg by mouth daily after breakfast.   Past Week at Unknown time  . atorvastatin (LIPITOR) 10 MG tablet Take 10 mg by mouth daily after supper.   08/17/2016 at Unknown time  . Biotin 16109 MCG TABS Take 100 mg by mouth daily.   08/17/2016 at Unknown time  . buPROPion (WELLBUTRIN XL) 300 MG 24 hr tablet Take 300 mg by mouth daily before breakfast.   08/18/2016 at 0315  . Calcium Carb-Cholecalciferol (CALCIUM 600 + D PO) Take 1 tablet by mouth daily.   Past Week at Unknown time  . COLLAGEN PO Take 2 tablets by mouth daily after breakfast. JOINT COLLAGEN   08/17/2016 at Unknown time  . Continuous Glucose Monitor DEVI 1 Device by Other route continuous.     Marland Kitchen desloratadine (CLARINEX) 5 MG tablet Take 5  mg by mouth daily before breakfast.   08/18/2016 at 0315  . etodolac (LODINE) 500 MG tablet Take 500 mg by mouth daily after breakfast.   Past Week at Unknown time  . gabapentin (NEURONTIN) 100 MG capsule Take 100-300 mg by mouth 3 (three) times daily. Take 1 capsule (100 mg) by mouth first thing in the morning, take 1 capsule (100 mg) by mouth at lunch, & take 3 capsules (300 mg) at bedtime   08/18/2016 at 0315  . Glucosamine-Chondroitin (OSTEO BI-FLEX REGULAR STRENGTH PO) Take 1 tablet by mouth daily after breakfast.   08/17/2016 at Unknown time  . irbesartan (AVAPRO) 300 MG tablet Take 300 mg by mouth daily after  breakfast.   08/17/2016 at Unknown time  . levothyroxine (SYNTHROID, LEVOTHROID) 137 MCG tablet Take 137 mcg by mouth daily before breakfast.   08/18/2016 at 0315  . Magnesium 500 MG TABS Take 500 mg by mouth daily after breakfast.   Past Week at Unknown time  . Multiple Vitamin (MULTIVITAMIN WITH MINERALS) TABS tablet Take 1 tablet by mouth daily after breakfast.   Past Week at Unknown time  . oxyCODONE-acetaminophen (PERCOCET) 5-325 MG per tablet Take 1 tablet by mouth 2 (two) times daily. After breakfast & after supper   08/17/2016 at Unknown time  . potassium gluconate 595 (99 K) MG TABS tablet Take 595 mg by mouth daily after breakfast.   08/17/2016 at Unknown time  . Probiotic Product (FORTIFY DAILY PROBIOTIC PO) Take 1 capsule by mouth daily after breakfast.   08/17/2016 at Unknown time  . Insulin Human (INSULIN PUMP) SOLN Inject into the skin continuous. Novolog      Allergies  Allergen Reactions  . Penicillins Anaphylaxis and Other (See Comments)    CLOSES THROAT PATIENT HAD A PCN REACTION WITH IMMEDIATE RASH, FACIAL/TONGUE/THROAT SWELLING, SOB, OR LIGHTHEADEDNESS WITH HYPOTENSION:  #  #  #  YES  #  #  #   Has patient had a PCN reaction causing severe rash involving mucus membranes or skin necrosis: UNKNOWN  Has patient had a PCN reaction that required hospitalization:No Has patient had a PCN reaction occurring within the last 10 years:No   . Hydrocodone-Acetaminophen     Nausea/vomiting with or without food  . Adhesive [Tape] Rash    Specifically the pink tape caused redness and itching     Social History  Substance Use Topics  . Smoking status: Former Smoker    Quit date: 03/11/1991  . Smokeless tobacco: Never Used  . Alcohol use Yes     Comment: rarely    Family History  Problem Relation Age of Onset  . Stroke Father      Review of Systems A comprehensive review of systems was negative.  Objective: Vital signs in last 24 hours: Temp:  [97.9 F (36.6 C)] 97.9 F (36.6  C) (06/11 0658) Pulse Rate:  [87] 87 (06/11 0658) Resp:  [18] 18 (06/11 0658) BP: (133)/(65) 133/65 (06/11 0658) SpO2:  [100 %] 100 % (06/11 0658) Weight:  [77.2 kg (170 lb 4.8 oz)] 77.2 kg (170 lb 4.8 oz) (06/11 0658)  EXAM: Patient is a well-developed well-nourished white female in no acute distress. Lungs are clear to auscultation , the patient has symmetrical respiratory excursion. Heart has a regular rate and rhythm normal S1 and S2 no murmur.   Abdomen is soft nontender nondistended bowel sounds are present. Extremity examination shows no clubbing cyanosis or edema. Motor examination shows 5 over 5 strength in the lower extremities  including the iliopsoas quadriceps dorsiflexor extensor hallicus  longus and plantar flexor bilaterally. Sensation is intact to pinprick in the distal lower extremities. Reflexes are symmetrical bilaterally. No pathologic reflexes are present. Patient has a normal gait and stance.    Data Review:CBC    Component Value Date/Time   WBC 4.3 08/11/2016 0936   RBC 3.83 (L) 08/11/2016 0936   HGB 12.1 08/11/2016 0936   HCT 36.5 08/11/2016 0936   PLT 206 08/11/2016 0936   MCV 95.3 08/11/2016 0936   MCH 31.6 08/11/2016 0936   MCHC 33.2 08/11/2016 0936   RDW 11.9 08/11/2016 0936                          BMET    Component Value Date/Time   NA 141 08/11/2016 0936   K 4.5 08/11/2016 0936   CL 105 08/11/2016 0936   CO2 28 08/11/2016 0936   GLUCOSE 62 (L) 08/11/2016 0936   BUN 18 08/11/2016 0936   CREATININE 0.84 08/11/2016 0936   CALCIUM 9.8 08/11/2016 0936   GFRNONAA >60 08/11/2016 0936   GFRAA >60 08/11/2016 0936     Assessment/Plan: Patient with advanced multilevel lumbar degeneration with resulting scoliosis and stenosis who is admitted now for a 3 level XLIF procedure with pedicle screw fixation.I've discussed with the patient the nature of his condition, the nature the surgical procedure, the typical length of surgery, hospital stay, and overall  recuperation, the limitations postoperatively, and risks of surgery. I discussed risks including risks of infection, bleeding, possibly need for transfusion, the risk of nerve root dysfunction with pain, weakness, numbness, or paresthesias, the risk of dural tear and CSF leakage and possible need for further surgery, risk of vascular injury, the risk of failure of the arthrodesis and possibly for further surgery, the risk of anesthetic complications including myocardial infarction, stroke, pneumonia, and death. We discussed the need for postoperative immobilization in a lumbar brace. Understanding all this the patient does wish to proceed with surgery and is admitted for such.     Hewitt ShortsNUDELMAN,ROBERT W, MD 08/18/2016 7:21 AM

## 2016-08-18 NOTE — Op Note (Signed)
08/18/2016  3:17 PM  PATIENT:  Colleen Zuniga  56 y.o. female  PRE-OPERATIVE DIAGNOSIS:  Multilevel, multifactorial lumbar stenosis with neurogenic claudication; degenerative scoliosis with a coronal Cobb angle of 28; lumbar spondylosis, lumbar degenerative disc disease  POST-OPERATIVE DIAGNOSIS:  Multilevel, multifactorial lumbar stenosis with neurogenic claudication; degenerative scoliosis with a coronal Cobb angle of 28; lumbar spondylosis, lumbar degenerative disc disease  PROCEDURE:  Procedure(s):  ANTERIOR LATERAL LUMBAR INTERBODY ARTHRODESIS LUMBAR TWO- LUMBAR THREE, LUMBAR THREE- LUMBAR FOUR, LUMBAR FOUR- LUMBAR FIVE WITH NUVASIVE COROENT XL INTERBODY IMPLANTS, VITOSSS BA, AND INFUSE; LUMBAR TWO- LUMBAR FIVE PERCUTANEOUS PEDICLE SCREW FIXATION WITH NUVASIVE RELINE MAS REDUCTION POSTERIOR INSTRUMENTATION AND BENDINI  SURGEON:  Surgeon(s): Shirlean Kelly, MD  ASSISTANTS: Cherrie Distance, M.D.  ANESTHESIA:   general  EBL:  Total I/O In: 2000 [I.V.:2000] Out: 895 [Urine:770; Blood:125]  BLOOD ADMINISTERED:none  COUNT: Correct per nursing staff  DICTATION:  Patient was brought to the operating room, placed under general endotracheal anesthesia. C-arm fluoroscopy was used throughout the procedure, in AP and lateral projections, for localization and procedural guidance. Intraoperative electrophysiologic neural monitoring was performed throughout the procedure, for procedural guidance. Each incision was infiltrated with local aesthetic with epinephrine.  The procedure was done in a 2 stage fashion, the first stage was the 3 level anterolateral interbody arthrodesis done in a lateral decubitus position, and the second stage was the placement of percutaneous pedicle screw fixation done in the prone position.  Patient was turned to a left side down lateral decubitus position for the first stage of the surgery. The table was flexed to allow exposure between the right lateral costal  margin and the right iliac crest.  We proceeded with the 3 level anterolateral interbody arthrodesis. The right anterior, lateral, and posterior aspect of the torso were prepped with Betadine soap and solution and draped in a sterile fashion. We first approached the L4-5 level. An incision was made paralleling the lateral aspect of the L4-5 disc space. A posterior lateral incision was made to first enter the retroperitoneal space. Via that exposure we able to guide the lateral entry, down to the lateral aspect of the vertebral column. The first dilator was positioned over the lateral aspect of the L4-5 disc space, the surrounding tissues were tested, and no neural tissue was within the exposure. We then placed a K wire into the disc space, and then passed a series of 2 additional dilators, each time doing electrophysiologic neural testing. We then passed the self-retaining retractor down, again with neural monitoring. Once we had good positioning of the retractor, and the electrophysiologic testing performed, we tamped a shim into place to secure the self retaining retractor. We then coagulated tissues overlying the lateral aspect of the annulus, and incised the annulus and entered the L4-5 disc space. Discectomy was performed using a variety of pituitary rongeurs and curettes. Cartilaginous endplates were carefully removed, and the contralateral annulus was released. We passed a series of sizers and in the end selected a 10 x 18 x 55 x 10 implant. Implant was packed with a combination of Vitoss BA and infuse. Then using a pair of slides, with C-arm fluoroscopic guidance we are able to gently tamped the implant into position. The slides were removed, and good positioning of the interbody implant was confirmed. We then carefully removed the self-retaining retractor under direct visualization, and no bleeding was seen.  This technique was repeated at the L3-4 and L2-3 levels sequentially. Each incision was made  paralleling the corresponding  lateral aspect of the disc space. We were able to use the same posterior lateral incision for guidance into the retroperitoneal space for each of the approaches. A 10 x 22 x 50 x 10 implant was used at the L3-4 level and an 8 x 22 x 50 x 0 implant was used at the L2-3 level.  Once the 3 anterolateral interbody arthrodesis were completed we proceeded with closure. At each of the 4 incisions, the deep fascia was closed with interrupted undyed 1 Vicryl sutures, Scarpa's fascia was closed with interrupted inverted 2-0 Vicryl sutures, and the subcutaneous and subcuticular layers were closed with interrupted inverted 2-0 undyed Vicryl sutures. Skin edges were approximated with Dermabond. After completion of the second stage of the surgery, these wounds were dressed with sterile gauze and Hypafix.  After completion of the first stage of the surgery, the patient was turned back to a supine position, and transferred to a stretcher. She was then turned to a prone position on the operating room table. We then proceeded with the second stage of the surgery. The lower thoracic and entire lumbar region were prepped with Betadine soap and solution and draped in a sterile fashion. Entry points were identified pedicle screw placement bilaterally at L2, on the right side at L3, on the left side at L4, and bilaterally at L5. For each screw, the pedicle was probed with a Jamshidi needle, and a K wire passed through the pedicle into the vertebral body. Once all 6 K wires were in place, we measured depth and used 5.5 mm in diameter self-tapping screws; using a pair of 40 mm screws bilaterally at L2, a 45 mm screw on the right at L3 and on the left at L4, and a 40 mm screw on the left and a 45 lumbar screw on the right at L5. Once all 6 screws were in place, that Bendini system was used to measure the proper rod length and shape. 5.5 x 100 mm rods were used bilaterally, and bent according to the  Bendini's determination. Each of the rods was passed into the corresponding screw heads, on each side. Once the rods were in position, locking caps were placed and sequentially tightened. Final tightening of the locking caps was performed against a counter torque. We then removed the superstructure of each of the screws. The wound was then closed in multiple layers. Deep fascia closed interrupted undyed 1 Vicryl sutures. Subcutaneous and subcuticular closed with interrupted inverted 2-0 Vicryl sutures. Skin edges were approximately with Dermabond. These wounds were dressed with sterile gauze and Hypafix. Following surgery the patient was turned back to supine position, to be reversed an anesthetic, extubated, and transferred to the recovery room for further care.  PLAN OF CARE: Admit to inpatient   PATIENT DISPOSITION:  PACU - hemodynamically stable.   Delay start of Pharmacological VTE agent (>24hrs) due to surgical blood loss or risk of bleeding:  yes

## 2016-08-19 ENCOUNTER — Encounter (HOSPITAL_COMMUNITY): Payer: Self-pay | Admitting: Neurosurgery

## 2016-08-19 LAB — GLUCOSE, CAPILLARY
Glucose-Capillary: 180 mg/dL — ABNORMAL HIGH (ref 65–99)
Glucose-Capillary: 216 mg/dL — ABNORMAL HIGH (ref 65–99)
Glucose-Capillary: 163 mg/dL — ABNORMAL HIGH (ref 65–99)
Glucose-Capillary: 105 mg/dL — ABNORMAL HIGH (ref 65–99)
Glucose-Capillary: 92 mg/dL (ref 65–99)

## 2016-08-19 MED ORDER — INSULIN ASPART 100 UNIT/ML ~~LOC~~ SOLN
3.0000 [IU] | Freq: Once | SUBCUTANEOUS | Status: AC
  Administered 2016-08-19: 3 [IU] via SUBCUTANEOUS

## 2016-08-19 MED ORDER — ACETAMINOPHEN 500 MG PO TABS
1000.0000 mg | ORAL_TABLET | Freq: Four times a day (QID) | ORAL | Status: DC | PRN
  Administered 2016-08-19 – 2016-08-20 (×2): 1000 mg via ORAL
  Filled 2016-08-19 (×3): qty 2

## 2016-08-19 NOTE — Progress Notes (Signed)
Inpatient Diabetes Program Recommendations  AACE/ADA: New Consensus Statement on Inpatient Glycemic Control (2015)  Target Ranges:  Prepandial:   less than 140 mg/dL      Peak postprandial:   less than 180 mg/dL (1-2 hours)      Critically ill patients:  140 - 180 mg/dL   Lab Results  Component Value Date   GLUCAP 163 (H) 08/19/2016   HGBA1C 6.1 (H) 08/11/2016    Review of Glycemic Control  Diabetes history: DM1 Outpatient Diabetes medications: Insulin Pump (see below for settings) Current orders for Inpatient glycemic control: Insulin Pump Order Set  Settings: Basal - MN - 0.4 0400 - 0.925 0600 - 1.1 0830 - 1.27 1130 - 0.7 1600 0.8 1900 - 1.4 2100 - 0.8 Total basal: 20.78 units  Goal - 120 CF - 40 CHO ratio: 1:10  Pt feeling much better. No more nausea. Walking some in halls. Very knowledgeable regarding her insulin pump.  Will follow while inpatient. Thank you. Ailene Ardshonda Kiani Wurtzel, RD, LDN, CDE Inpatient Diabetes Coordinator [phone]

## 2016-08-19 NOTE — Progress Notes (Signed)
Vitals:   08/18/16 1956 08/18/16 2347 08/19/16 0427 08/19/16 0806  BP: 124/60 (!) 153/65 (!) 147/61 (!) 121/52  Pulse: 88 97 98 91  Resp: 18 18 18 16   Temp: 97.5 F (36.4 C) 98.4 F (36.9 C) 97.8 F (36.6 C) 99.1 F (37.3 C)  TempSrc: Oral Oral Oral   SpO2: 98% 99% 97% 98%  Weight:      Height:        Patient sitting up, trying to eat, but having significant nausea and has vomited several times. Has been given Vistaril both IM and by mouth. We'll try Zofran. Has ambulated in the halls with the staff several times. Foley DC'd in the past hour, nursing staff to monitor voiding function. Dressing is clean and dry. Blood sugar running in the low 200s, patient adjusting insulin pump as needed. We'll give insulin 3 units regular subcutaneous now.  Plan: Continue to progress through postoperative recovery. Encouraged to ambulate.  Hewitt ShortsNUDELMAN,ROBERT W, MD 08/19/2016, 8:56 AM

## 2016-08-19 NOTE — Anesthesia Postprocedure Evaluation (Signed)
Anesthesia Post Note  Patient: Wynne DustDonna Lee Lanese  Procedure(s) Performed: Procedure(s) (LRB): ANTERIOR LATERAL LUMBAR FUSION LUMBAR TWO- LUMBAR THREE, LUMBAR THREE- LUMBAR FOUR, LUMBAR FOUR- LUMBAR FIVE; LUMBAR TWO- LUMBAR FIVE PEDICLE SCREW FIXATION (N/A) POSTERIOR LUMBAR FUSION 3 LEVEL (N/A)     Patient location during evaluation: PACU Anesthesia Type: General Level of consciousness: awake and alert and patient cooperative Pain management: pain level controlled Vital Signs Assessment: post-procedure vital signs reviewed and stable Respiratory status: spontaneous breathing and respiratory function stable Cardiovascular status: stable Anesthetic complications: no    Last Vitals:  Vitals:   08/19/16 0427 08/19/16 0806  BP: (!) 147/61 (!) 121/52  Pulse: 98 91  Resp: 18 16  Temp: 36.6 C 37.3 C    Last Pain:  Vitals:   08/19/16 0641  TempSrc:   PainSc: 3                  Keaundre Thelin S

## 2016-08-20 LAB — GLUCOSE, CAPILLARY
Glucose-Capillary: 215 mg/dL — ABNORMAL HIGH (ref 65–99)
Glucose-Capillary: 173 mg/dL — ABNORMAL HIGH (ref 65–99)

## 2016-08-20 MED ORDER — OXYCODONE-ACETAMINOPHEN 5-325 MG PO TABS: 1.0000 | ORAL_TABLET | ORAL | 0 refills | Status: DC | PRN

## 2016-08-20 NOTE — Progress Notes (Signed)
Patient alert and oriented, mae's well, voiding adequate amount of urine, swallowing without difficulty, no c/o pain at time of discharge. Patient discharged home with family. Script and discharged instructions given to patient. Patient and family stated understanding of instructions given. Patient has an appointment with Dr. Nudelman 

## 2016-08-20 NOTE — Discharge Instructions (Signed)

## 2016-08-20 NOTE — Discharge Summary (Signed)
Physician Discharge Summary  Patient ID: Colleen Zuniga MRN: [id number] DOB/AGE: November 17, 1960 56 y.o.  Admit date: 08/18/2016 Discharge date: 08/20/2016  Admission Diagnoses:  Multilevel, multifactorial lumbar stenosis with neurogenic claudication; degenerative scoliosis with a coronal Cobb angle of 28; lumbar spondylosis, lumbar degenerative disc disease  Discharge Diagnoses:  Multilevel, multifactorial lumbar stenosis with neurogenic claudication; degenerative scoliosis with a coronal Cobb angle of 28; lumbar spondylosis, lumbar degenerative disc disease Active Problems:   Lumbar stenosis   Discharged Condition: good  Hospital Course: Patient admitted, underwent a 3 level L2-3, L3-4, and L4-5 XLIF with L2-L5 percutaneous pedicle screw fixation. Patient did well following surgery. She is up and ambulating actively. She is voiding well. She did have some difficulties with nausea which have settled down. She is a type I diabetic, on insulin pump, and her blood sugars have been controlled. Her dressings were removed this morning, and her 9 incisions are healing nicely. None have any swelling, erythema, ecchymosis, or drainage. She's been given instructions regarding wound care and activities following discharge. She is scheduled to follow-up with me in the office with x-rays in 3 weeks.  Discharge Exam: Blood pressure 139/61, pulse 98, temperature 98.9 F (37.2 C), temperature source Oral, resp. rate 18, height 5' 1.5" (1.562 m), weight 77.2 kg (170 lb 4.8 oz), SpO2 97 %.  Disposition: 01-Home or Self Care  Discharge Instructions    Discharge wound care:    Complete by:  As directed    Leave the wound open to air. Shower daily with the wound uncovered. Water and soapy water should run over the incision area. Do not wash directly on the incision for 2 weeks. Remove the glue after 2 weeks.   Driving Restrictions    Complete by:  As directed    No driving for 2 weeks. May ride in the car  locally now. May begin to drive locally in 2 weeks.   Other Restrictions    Complete by:  As directed    Walk gradually increasing distances out in the fresh air at least twice a day. Walking additional 6 times inside the house, gradually increasing distances, daily. No bending, lifting, or twisting. Perform activities between shoulder and waist height (that is at counter height when standing or table height when sitting).     Allergies as of 08/20/2016      Reactions   Penicillins Anaphylaxis, Other (See Comments)   CLOSES THROAT PATIENT HAD A PCN REACTION WITH IMMEDIATE RASH, FACIAL/TONGUE/THROAT SWELLING, SOB, OR LIGHTHEADEDNESS WITH HYPOTENSION:  #  #  #  YES  #  #  #   Has patient had a PCN reaction causing severe rash involving mucus membranes or skin necrosis: UNKNOWN  Has patient had a PCN reaction that required hospitalization:No Has patient had a PCN reaction occurring within the last 10 years:No   Hydrocodone-acetaminophen    Nausea/vomiting with or without food   Adhesive [tape] Rash   Specifically the pink tape caused redness and itching       Medication List    TAKE these medications   acetaminophen 500 MG tablet Commonly known as:  TYLENOL Take 1,000 mg by mouth every 6 (six) hours as needed for mild pain.   ALPRAZolam 0.5 MG tablet Commonly known as:  XANAX Take 0.25-0.5 mg by mouth at bedtime as needed for sleep or anxiety.   atorvastatin 10 MG tablet Commonly known as:  LIPITOR Take 10 mg by mouth daily after supper.   Biotin 7829510000 MCG Tabs  Take 100 mg by mouth daily.   buPROPion 300 MG 24 hr tablet Commonly known as:  WELLBUTRIN XL Take 300 mg by mouth daily before breakfast.   CALCIUM 600 + D PO Take 1 tablet by mouth daily.   COLLAGEN PO Take 2 tablets by mouth daily after breakfast. JOINT COLLAGEN   Continuous Glucose Monitor Devi 1 Device by Other route continuous.   desloratadine 5 MG tablet Commonly known as:  CLARINEX Take 5 mg by mouth  daily before breakfast.   etodolac 500 MG tablet Commonly known as:  LODINE Take 500 mg by mouth daily after breakfast.   FORTIFY DAILY PROBIOTIC PO Take 1 capsule by mouth daily after breakfast.   gabapentin 100 MG capsule Commonly known as:  NEURONTIN Take 100-300 mg by mouth 3 (three) times daily. Take 1 capsule (100 mg) by mouth first thing in the morning, take 1 capsule (100 mg) by mouth at lunch, & take 3 capsules (300 mg) at bedtime   insulin pump Soln Inject into the skin continuous. Novolog   irbesartan 300 MG tablet Commonly known as:  AVAPRO Take 300 mg by mouth daily after breakfast.   levothyroxine 137 MCG tablet Commonly known as:  SYNTHROID, LEVOTHROID Take 137 mcg by mouth daily before breakfast.   Magnesium 500 MG Tabs Take 500 mg by mouth daily after breakfast.   multivitamin with minerals Tabs tablet Take 1 tablet by mouth daily after breakfast.   OSTEO BI-FLEX REGULAR STRENGTH PO Take 1 tablet by mouth daily after breakfast.   oxyCODONE-acetaminophen 5-325 MG tablet Commonly known as:  PERCOCET/ROXICET Take 1 tablet by mouth 2 (two) times daily. After breakfast & after supper What changed:  Another medication with the same name was added. Make sure you understand how and when to take each.   oxyCODONE-acetaminophen 5-325 MG tablet Commonly known as:  PERCOCET/ROXICET Take 1-2 tablets by mouth every 4 (four) hours as needed (pain). What changed:  You were already taking a medication with the same name, and this prescription was added. Make sure you understand how and when to take each.   potassium gluconate 595 (99 K) MG Tabs tablet Take 595 mg by mouth daily after breakfast.   vitamin C with rose hips 1000 MG tablet Take 1,000 mg by mouth daily after breakfast.        Signed: Hewitt Shorts 08/20/2016, 7:54 AM

## 2016-08-21 ENCOUNTER — Emergency Department (HOSPITAL_BASED_OUTPATIENT_CLINIC_OR_DEPARTMENT_OTHER)
Admission: RE | Admit: 2016-08-21 | Discharge: 2016-08-21 | Disposition: A | Discharge status: Home / Self Care | Payer: PRIVATE HEALTH INSURANCE | Source: Ambulatory Visit | Attending: Emergency Medicine | Admitting: Emergency Medicine

## 2016-08-21 ENCOUNTER — Encounter (HOSPITAL_COMMUNITY): Payer: Self-pay

## 2016-08-21 ENCOUNTER — Emergency Department (HOSPITAL_COMMUNITY)
Admission: EM | Admit: 2016-08-21 | Discharge: 2016-08-21 | Disposition: A | Discharge status: Home / Self Care | Payer: PRIVATE HEALTH INSURANCE | Attending: Emergency Medicine | Admitting: Emergency Medicine

## 2016-08-21 DIAGNOSIS — Z87891 Personal history of nicotine dependence: Secondary | ICD-10-CM | POA: Diagnosis not present

## 2016-08-21 DIAGNOSIS — Z794 Long term (current) use of insulin: Secondary | ICD-10-CM | POA: Insufficient documentation

## 2016-08-21 DIAGNOSIS — R600 Localized edema: Secondary | ICD-10-CM | POA: Diagnosis not present

## 2016-08-21 DIAGNOSIS — R140 Abdominal distension (gaseous): Secondary | ICD-10-CM | POA: Diagnosis not present

## 2016-08-21 DIAGNOSIS — M7989 Other specified soft tissue disorders: Secondary | ICD-10-CM

## 2016-08-21 DIAGNOSIS — E119 Type 2 diabetes mellitus without complications: Secondary | ICD-10-CM | POA: Diagnosis not present

## 2016-08-21 DIAGNOSIS — E039 Hypothyroidism, unspecified: Secondary | ICD-10-CM | POA: Diagnosis not present

## 2016-08-21 LAB — COMPREHENSIVE METABOLIC PANEL
ALT: 76 U/L — ABNORMAL HIGH (ref 14–54)
AST: 155 U/L — ABNORMAL HIGH (ref 15–41)
Albumin: 3.3 g/dL — ABNORMAL LOW (ref 3.5–5.0)
Alkaline Phosphatase: 94 U/L (ref 38–126)
Anion gap: 7 (ref 5–15)
BUN: 10 mg/dL (ref 6–20)
CO2: 28 mmol/L (ref 22–32)
Calcium: 9.2 mg/dL (ref 8.9–10.3)
Chloride: 103 mmol/L (ref 101–111)
Creatinine, Ser: 0.71 mg/dL (ref 0.44–1.00)
GFR calc Af Amer: >60 mL/min (ref >60)
GFR calc non Af Amer: >60 mL/min (ref >60)
Glucose, Bld: 119 mg/dL — ABNORMAL HIGH (ref 65–99)
Potassium: 4.4 mmol/L (ref 3.5–5.1)
Sodium: 138 mmol/L (ref 135–145)
Total Bilirubin: 1 mg/dL (ref 0.3–1.2)
Total Protein: 6 g/dL — ABNORMAL LOW (ref 6.5–8.1)

## 2016-08-21 LAB — CBC
HCT: 28.9 % — ABNORMAL LOW (ref 36.0–46.0)
Hemoglobin: 9.5 g/dL — ABNORMAL LOW (ref 12.0–15.0)
MCH: 31.4 pg (ref 26.0–34.0)
MCHC: 32.9 g/dL (ref 30.0–36.0)
MCV: 95.4 fL (ref 78.0–100.0)
Platelets: 170 10*3/uL (ref 150–400)
RBC: 3.03 MIL/uL — ABNORMAL LOW (ref 3.87–5.11)
RDW: 11.8 % (ref 11.5–15.5)
WBC: 5.3 10*3/uL (ref 4.0–10.5)

## 2016-08-21 LAB — LIPASE, BLOOD: Lipase: 22 U/L (ref 11–51)

## 2016-08-21 MED ORDER — POLYETHYLENE GLYCOL 3350 17 G PO PACK: 17.0000 g | PACK | Freq: Two times a day (BID) | ORAL | 0 refills | Status: DC | PRN

## 2016-08-21 NOTE — ED Triage Notes (Signed)
Pt endorses abd distenion, constipation, and tightness in her thighs since having back surgery Monday. Denies n/v. VSS.

## 2016-08-21 NOTE — ED Provider Notes (Signed)
[facility]-EMERGENCY DEPT Provider Note   CSN: [id number] Arrival date & time: 08/21/16  1639  By signing my name below, I, Linna Darner, attest that this documentation has been prepared under the direction and in the presence of physician practitioner, Raeford Razor, MD. Electronically Signed: Linna Darner, Scribe. 08/21/2016. 7:10 PM.  History   Chief Complaint Chief Complaint  Patient presents with  . Post-op Problem  . Abdominal Pain   The history is provided by the patient. No language interpreter was used.    HPI Comments: Colleen Zuniga is a 56 y.o. female with PMHx including DM and DDD who presents to the Emergency Department complaining of persistent lower abdominal distention beginning yesterday. She is s/p anterior lateral lumbar fusion surgery on 08/18/16. Patient was discharged from the hospital yesterday and states she noticed some abdominal distention shortly prior to her discharge. Patient reports associated swelling from her bilateral thighs into her lower legs and some mild lower abdominal pain. Patient endorses some localized numbness to her right thigh as well. She notes that she has been constipated for 5 days but has been passing flatulence. She has been ambulatory since her back surgery and has had some moderate discomfort in her lower back since the operation. Patient has been taking Tylenol during the day and Percocet 5-325mg  each night since her surgery with some relief of her back pain. She denies pain in her epigastrium, chest pain, nausea, vomiting, leg pain, dyspnea, or any other associated symptoms.  Past Medical History:  Diagnosis Date  . Arthritis   . De Quervain's tenosynovitis, right 01/2012  . Degenerative disc disease, cervical    states neck is stiff, reduced range of motion  . Dental crowns present   . Depression   . Diabetes mellitus    Insulin pump  . Heart murmur    states has a functional murmur, and that she has never had any problems  .  Hypothyroidism   . PONV (postoperative nausea and vomiting)     Patient Active Problem List   Diagnosis Date Noted  . Lumbar stenosis 08/18/2016  . Arthritis   . Hypokalemia   . Chest pain     Past Surgical History:  Procedure Laterality Date  . ANTERIOR CERVICAL DISCECTOMY  02/20/2016  . ANTERIOR LAT LUMBAR FUSION N/A 08/18/2016   Procedure: ANTERIOR LATERAL LUMBAR FUSION LUMBAR TWO- LUMBAR THREE, LUMBAR THREE- LUMBAR FOUR, LUMBAR FOUR- LUMBAR FIVE; LUMBAR TWO- LUMBAR FIVE PEDICLE SCREW FIXATION;  Surgeon: Shirlean Kelly, MD;  Location: [facility] OR;  Service: Neurosurgery;  Laterality: N/A;  ANTERIOR LATERAL LUMBAR FUSION LUMBAR 2- LUMBAR 3, LUMBAR 3- LUMBAR 4-, LUMBAR 4- LUMBAR 5; LUMBAR 2- LUMBAR 5 PEDICLE SCREW FIXATION  . CESAREAN SECTION    . DORSAL COMPARTMENT RELEASE  02/08/2002   first dorsal compartment left wrist  . DORSAL COMPARTMENT RELEASE  02/03/2012   Procedure: RELEASE DORSAL COMPARTMENT (DEQUERVAIN);  Surgeon: Nicki Reaper, MD;  Location: [facility];  Service: Orthopedics;  Laterality: Right;  RELEASE DEQUERVAINS RIGHT WRIST  . KNEE ARTHROSCOPY  10/14/2004   right  . SHOULDER ARTHROSCOPY  05/05/2005   left  . TRIGGER FINGER RELEASE  01/07/2011   release A1 pulley left thumb  . TRIGGER FINGER RELEASE     x 5 other fingers    OB History    No data available       Home Medications    Prior to Admission medications   Medication Sig Start Date End Date Taking? Authorizing Provider  acetaminophen (TYLENOL) 500 MG tablet Take 1,000 mg by mouth every 6 (six) hours as needed for mild pain.     [provider]  ALPRAZolam Prudy Feeler) 0.5 MG tablet Take 0.25-0.5 mg by mouth at bedtime as needed for sleep or anxiety. 05/25/16   [provider]  Ascorbic Acid (VITAMIN C WITH ROSE HIPS) 1000 MG tablet Take 1,000 mg by mouth daily after breakfast.    [provider]  atorvastatin (LIPITOR) 10 MG tablet Take 10 mg by mouth daily after supper.     [provider]  Biotin 91478 MCG TABS Take 100 mg by mouth daily.    [provider]  buPROPion (WELLBUTRIN XL) 300 MG 24 hr tablet Take 300 mg by mouth daily before breakfast.    [provider]  Calcium Carb-Cholecalciferol (CALCIUM 600 + D PO) Take 1 tablet by mouth daily.    [provider]  COLLAGEN PO Take 2 tablets by mouth daily after breakfast. JOINT COLLAGEN    [provider]  Continuous Glucose Monitor DEVI 1 Device by Other route continuous.    [provider]  desloratadine (CLARINEX) 5 MG tablet Take 5 mg by mouth daily before breakfast.    [provider]  etodolac (LODINE) 500 MG tablet Take 500 mg by mouth daily after breakfast.    [provider]  gabapentin (NEURONTIN) 100 MG capsule Take 100-300 mg by mouth 3 (three) times daily. Take 1 capsule (100 mg) by mouth first thing in the morning, take 1 capsule (100 mg) by mouth at lunch, & take 3 capsules (300 mg) at bedtime    [provider]  Glucosamine-Chondroitin (OSTEO BI-FLEX REGULAR STRENGTH PO) Take 1 tablet by mouth daily after breakfast.    [provider]  Insulin Human (INSULIN PUMP) SOLN Inject into the skin continuous. Novolog    [provider]  irbesartan (AVAPRO) 300 MG tablet Take 300 mg by mouth daily after breakfast.    [provider]  levothyroxine (SYNTHROID, LEVOTHROID) 137 MCG tablet Take 137 mcg by mouth daily before breakfast.    [provider]  Magnesium 500 MG TABS Take 500 mg by mouth daily after breakfast.    [provider]  Multiple Vitamin (MULTIVITAMIN WITH MINERALS) TABS tablet Take 1 tablet by mouth daily after breakfast.    [provider]  oxyCODONE-acetaminophen (PERCOCET) 5-325 MG per tablet Take 1 tablet by mouth 2 (two) times daily. After breakfast & after supper    [provider]  oxyCODONE-acetaminophen (PERCOCET/ROXICET) 5-325 MG tablet Take  1-2 tablets by mouth every 4 (four) hours as needed (pain). 08/20/16   Shirlean Kelly, MD  potassium gluconate 595 (99 K) MG TABS tablet Take 595 mg by mouth daily after breakfast.    [provider]  Probiotic Product (FORTIFY DAILY PROBIOTIC PO) Take 1 capsule by mouth daily after breakfast.    [provider]    Family History Family History  Problem Relation Age of Onset  . Stroke Father     Social History Social History  Substance Use Topics  . Smoking status: Former Smoker    Quit date: 03/11/1991  . Smokeless tobacco: Never Used  . Alcohol use Yes     Comment: rarely     Allergies   Penicillins; Hydrocodone-acetaminophen; and Adhesive [tape]   Review of Systems Review of Systems  Respiratory: Negative for shortness of breath.   Cardiovascular: Positive for leg swelling. Negative for chest pain.  Gastrointestinal: Positive for  abdominal distention, abdominal pain and constipation. Negative for nausea and vomiting.  Musculoskeletal: Positive for back pain. Negative for myalgias.  Neurological: Positive for numbness.  All other systems reviewed and are negative.  Physical Exam Updated Vital Signs BP (!) 148/57 (BP Location: Right Arm)   Pulse 97   Temp 98.6 F (37 C) (Oral)   Resp 16   Ht 5' 1.5" (1.562 m)   Wt 170 lb (77.1 kg)   SpO2 99%   BMI 31.60 kg/m   Physical Exam  Constitutional: She appears well-developed and well-nourished.  HENT:  Head: Normocephalic.  Right Ear: External ear normal.  Left Ear: External ear normal.  Nose: Nose normal.  Mouth/Throat: Oropharynx is clear and moist.  Eyes: Conjunctivae are normal. Right eye exhibits no discharge. Left eye exhibits no discharge.  Neck: Normal range of motion.  Cardiovascular: Normal rate, regular rhythm and normal heart sounds.   No murmur heard. Pulmonary/Chest: Effort normal and breath sounds normal. No respiratory distress. She has no wheezes. She has no rales.  Abdominal:  Soft. She exhibits distension. There is tenderness. There is no rebound and no guarding.  Mild distention. Soft. Minimally tender. Insulin pump left abdomen. Right flank incisions appear to be healing well.  Musculoskeletal: She exhibits edema.  Symmetric pitting lower extremity edema. Palpable DP pulses. Right hip flexion mildly limited by pain but seems normal given recent procedure. Strong plantar flexion.  Neurological: She is alert. No cranial nerve deficit. Coordination normal.  Sensation intact to light touch.   Skin: Skin is warm and dry. No rash noted. No erythema. No pallor.  Psychiatric: She has a normal mood and affect. Her behavior is normal.  Nursing note and vitals reviewed.  ED Treatments / Results  Labs (all labs ordered are listed, but only abnormal results are displayed) Labs Reviewed  COMPREHENSIVE METABOLIC PANEL - Abnormal; Notable for the following:       Result Value   Glucose, Bld 119 (*)    Total Protein 6.0 (*)    Albumin 3.3 (*)    AST 155 (*)    ALT 76 (*)    All other components within normal limits  CBC - Abnormal; Notable for the following:    RBC 3.03 (*)    Hemoglobin 9.5 (*)    HCT 28.9 (*)    All other components within normal limits  LIPASE, BLOOD    EKG  EKG Interpretation None       Radiology No results found.  Procedures Procedures (including critical care time)  DIAGNOSTIC STUDIES: Oxygen Saturation is 99% on RA, normal by my interpretation.    COORDINATION OF CARE: 7:06 PM Discussed treatment plan with pt at bedside and pt agreed to plan.  Medications Ordered in ED Medications - No data to display   Initial Impression / Assessment and Plan / ED Course  I have reviewed the triage vital signs and the nursing notes.  Pertinent labs & imaging results that were available during my care of the patient were reviewed by me and considered in my medical decision making (see chart for details).     55yF with b/l LE edema and  abdominal distension after recent back surgery. May be from IVf during case and poor mobilization since. NVI. Incisions look well. [place] neg for DVT. Abdominal distension probably constipation. Advised plenty of fluids. Bowel regimen. It has been determined that no acute conditions requiring further emergency intervention are present at this time. The patient has been advised of  the diagnosis and plan. I reviewed any labs and imaging including any potential incidental findings. We have discussed signs and symptoms that warrant return to the ED and they are listed in the discharge instructions.    Final Clinical Impressions(s) / ED Diagnoses   Final diagnoses:  Bilateral leg edema  Abdominal distension    New Prescriptions New Prescriptions   No medications on file   I personally preformed the services scribed in my presence. The recorded information has been reviewed is accurate. Raeford RazorStephen Tamana Hatfield, MD.    Raeford RazorKohut, Tatisha Cerino, MD 09/01/16 [phone]

## 2016-08-21 NOTE — Progress Notes (Signed)
**  Preliminary report by tech**  Bilateral lower extremity venous duplex completed. There is no evidence of deep or superficial vein thrombosis involving the right and left lower extremities. All visualized vessels appear patent and compressible. There is no evidence of Baker's cysts bilaterally. Results were given to the patient's nurse, Marylene Landngela.  08/21/16 7:32 PM Olen CordialGreg Natsumi Whitsitt RVT

## 2017-08-20 ENCOUNTER — Other Ambulatory Visit: Payer: Self-pay | Admitting: Internal Medicine

## 2017-08-20 DIAGNOSIS — R51 Headache: Principal | ICD-10-CM

## 2017-08-20 DIAGNOSIS — R519 Headache, unspecified: Secondary | ICD-10-CM

## 2017-08-25 ENCOUNTER — Ambulatory Visit
Admission: RE | Admit: 2017-08-25 | Discharge: 2017-08-25 | Disposition: A | Discharge status: Home / Self Care | Payer: PRIVATE HEALTH INSURANCE | Source: Ambulatory Visit | Attending: Internal Medicine | Admitting: Internal Medicine

## 2017-08-25 DIAGNOSIS — R519 Headache, unspecified: Secondary | ICD-10-CM

## 2017-08-25 DIAGNOSIS — R51 Headache: Principal | ICD-10-CM

## 2018-04-06 ENCOUNTER — Encounter (INDEPENDENT_AMBULATORY_CARE_PROVIDER_SITE_OTHER): Payer: Self-pay

## 2018-04-13 ENCOUNTER — Encounter (INDEPENDENT_AMBULATORY_CARE_PROVIDER_SITE_OTHER): Payer: Self-pay

## 2018-04-22 ENCOUNTER — Ambulatory Visit (INDEPENDENT_AMBULATORY_CARE_PROVIDER_SITE_OTHER): Payer: Self-pay | Admitting: Bariatrics

## 2018-04-29 ENCOUNTER — Encounter (INDEPENDENT_AMBULATORY_CARE_PROVIDER_SITE_OTHER): Payer: Self-pay | Admitting: Family Medicine

## 2018-04-29 ENCOUNTER — Ambulatory Visit (INDEPENDENT_AMBULATORY_CARE_PROVIDER_SITE_OTHER): Payer: PRIVATE HEALTH INSURANCE | Admitting: Family Medicine

## 2018-04-29 VITALS — BP 119/70 | HR 80 | Ht 62.0 in | Wt 191.0 lb

## 2018-04-29 DIAGNOSIS — Z9189 Other specified personal risk factors, not elsewhere classified: Secondary | ICD-10-CM

## 2018-04-29 DIAGNOSIS — R5383 Other fatigue: Secondary | ICD-10-CM | POA: Diagnosis not present

## 2018-04-29 DIAGNOSIS — Z1331 Encounter for screening for depression: Secondary | ICD-10-CM

## 2018-04-29 DIAGNOSIS — E109 Type 1 diabetes mellitus without complications: Secondary | ICD-10-CM | POA: Diagnosis not present

## 2018-04-29 DIAGNOSIS — E7849 Other hyperlipidemia: Secondary | ICD-10-CM

## 2018-04-29 DIAGNOSIS — Z6835 Body mass index (BMI) 35.0-35.9, adult: Secondary | ICD-10-CM

## 2018-04-29 DIAGNOSIS — E559 Vitamin D deficiency, unspecified: Secondary | ICD-10-CM | POA: Diagnosis not present

## 2018-04-29 DIAGNOSIS — Z0289 Encounter for other administrative examinations: Secondary | ICD-10-CM

## 2018-04-29 DIAGNOSIS — E038 Other specified hypothyroidism: Secondary | ICD-10-CM

## 2018-04-29 DIAGNOSIS — R7989 Other specified abnormal findings of blood chemistry: Secondary | ICD-10-CM

## 2018-04-29 DIAGNOSIS — R945 Abnormal results of liver function studies: Secondary | ICD-10-CM

## 2018-04-29 NOTE — Progress Notes (Addendum)
Office: [phone]  /  Fax: [phone]   Dear Dr. Chilton Greathouseavisankar Avva,    Thank you for referring Colleen Zuniga to our clinic. The following note includes my evaluation and treatment recommendations.  HPI:   Chief Complaint: OBESITY    Colleen DustDonna Lee Depascale has been referred by Dr. Chilton Greathouseavisankar Avva for consultation regarding her obesity and obesity related comorbidity.    Colleen Zuniga (MR# [id number]) is a 58 y.o. female who presents on 04/29/2018 for obesity evaluation and treatment. Current BMI is Body mass index is 34.93 kg/m.  Colleen Zuniga has been struggling with her weight for many years and has been unsuccessful in either losing weight, maintaining weight loss, or reaching her healthy weight goal.     Colleen Zuniga attended our information session and states she is currently in the action stage of change and ready to dedicate time achieving and maintaining a healthier weight. Colleen Zuniga is interested in becoming our patient and working on intensive lifestyle modifications including (but not limited to) diet, exercise and weight loss.    Colleen Zuniga states her family eats meals together she thinks her family will eat healthier with her her desired weight loss is 41 lbs she started gaining weight after XLIF surgery her heaviest weight ever was 198 lbs. she snacks frequently in the evenings she skips meals frequently she is frequently drinking liquids with calories   Fatigue Colleen Zuniga feels her energy is lower than it should be. This has worsened with weight gain and has not worsened recently. Colleen Zuniga denies daytime somnolence and denies waking up still tired. Patient is at risk for obstructive sleep apnea. Patient generally gets 7 or 8 hours of sleep per night, and states they generally have generally restful sleep. Snoring is present. Apneic episodes are not present. Epworth Sleepiness Score is 2.  Dyspnea on exertion Colleen Zuniga notes increasing shortness of breath with exercising and seems to be worsening over  time with weight gain. She notes getting out of breath sooner with activity than she used to. This has not gotten worse recently.  Vitamin D deficiency Colleen Zuniga has a diagnosis of vitamin D deficiency. She is not currently taking vit D alone, but is on a multivitamin. She does not have recent labs.  Diabetes I Colleen Zuniga has a diagnosis of diabetes type I. Colleen Zuniga has an insulin pump and continuous glucose monitoring. Her last A1c was 6.5 and she is a brittle diabetic and frequently has hypoglycemic episodes.  Hyperlipidemia Colleen Zuniga has hyperlipidemia and has been trying to improve her cholesterol levels with intensive lifestyle modification including a low saturated fat diet, exercise and weight loss.   At risk for cardiovascular disease Colleen Zuniga is at a higher than average risk for cardiovascular disease due to hyperlipidemia and obesity. She currently denies any chest pain.  Hypothyroid Colleen Zuniga has a diagnosis of hypothyroidism. She is not on levothyroxine. She admits heat intolerance.   Elevated Liver Function Tests Colleen Zuniga has a diagnosis of elevated liver function tests. She is due for labs today.  Depression Screen Colleen Zuniga Food and Mood (modified PHQ-9) score was 5.  Depression screen PHQ 2/9 04/29/2018  Decreased Interest 1  Down, Depressed, Hopeless 3  PHQ - 2 Score 4  Altered sleeping 0  Tired, decreased energy 0  Change in appetite 1  Feeling bad or failure about yourself  0  Trouble concentrating 0  Moving slowly or fidgety/restless 0  Suicidal thoughts 0  PHQ-9 Score 5  Difficult doing work/chores Not difficult at all   ASSESSMENT AND PLAN:  Other fatigue - Plan: EKG 12-Lead, CBC With Differential, Vitamin B12, Folate, T3, T4, free, TSH  Vitamin D deficiency - Plan: VITAMIN D 25 Hydroxy (Vit-D Deficiency, Fractures)  Type 1 diabetes mellitus without complication (HCC) - Plan: Comprehensive metabolic panel, Hemoglobin A1c, Microalbumin / creatinine urine ratio  Other  hyperlipidemia - Plan: Lipid Panel With LDL/HDL Ratio  Other specified hypothyroidism  Elevated LFTs - Plan: Lipid Panel With LDL/HDL Ratio  Depression screening  At risk for heart disease  Class 2 severe obesity with serious comorbidity and body mass index (BMI) of 35.0 to 35.9 in adult, unspecified obesity type (HCC)  PLAN:  Fatigue Colleen Zuniga was informed that her fatigue may be related to obesity, depression or many other causes. Labs will be ordered, and in the meanwhile Colleen Zuniga has agreed to work on diet, exercise and weight loss to help with fatigue. Proper sleep hygiene was discussed including the need for 7-8 hours of quality sleep each night. A sleep study was not ordered based on symptoms and Epworth sleepiness score. Colleen Zuniga agrees to follow up in 2 weeks.  Dyspnea on exertion Colleen Zuniga's shortness of breath appears to be obesity related and exercise induced. She has agreed to work on weight loss and gradually increase exercise to treat her exercise induced shortness of breath. If Colleen Zuniga follows our instructions and loses weight without improvement of her shortness of breath, we will plan to refer to pulmonology. We will monitor this condition regularly. Colleen Zuniga agrees to this plan.  Vitamin D Deficiency Colleen Zuniga was informed that low vitamin D levels contributes to fatigue and are associated with obesity, breast, and colon cancer. Labs will be ordered today and she agrees to follow up at the agreed upon time.  Diabetes I Colleen Zuniga has been given extensive diabetes education by myself today including ideal fasting and post-prandial blood glucose readings, individual ideal Hgb A1C goals, and hypoglycemia prevention. We discussed the importance of good blood sugar control to decrease the likelihood of diabetic complications such as nephropathy, neuropathy, limb loss, blindness, coronary artery disease, and death. We discussed the importance of intensive lifestyle modification including diet, exercise and  weight loss as the first line treatment for diabetes. Colleen Zuniga was given diabetes education and agreed to start her diet prescription. We will order labs today and she will follow up at the agreed upon time.  Hyperlipidemia Colleen Zuniga was informed of the American Heart Association Guidelines emphasizing intensive lifestyle modifications as the first line treatment for hyperlipidemia. We discussed many lifestyle modifications today in depth, and Colleen Zuniga will continue to work on decreasing saturated fats such as fatty red meat, butter and many fried foods. She will also increase vegetables and lean protein in her diet and continue to work on exercise and weight loss efforts. Labs were ordered today and she agrees to follow up in 2 weeks.  Cardiovascular risk counseling Colleen Zuniga was given extended (15 minutes) coronary artery disease prevention counseling today. She is 58 y.o. female and has risk factors for heart disease including hyperlipidemia and obesity. We discussed intensive lifestyle modifications today with an emphasis on specific weight loss instructions and strategies. Pt was also informed of the importance of increasing exercise and decreasing saturated fats to help prevent heart disease.  Hypothyroid Colleen Zuniga was informed of the importance of good thyroid control to help with weight loss efforts. She was also informed that supertherapeutic thyroid levels are dangerous and will not improve weight loss results. Labs will be obtained today and she will follow up at the agreed  upon time.  Elevated Liver Function Tests Colleen Zuniga will have labs drawn today and will follow up as directed.  Depression Screen Colleen Zuniga had a mildly positive depression screening. Depression is commonly associated with obesity and often results in emotional eating behaviors. We will monitor this closely and work on CBT to help improve the non-hunger eating patterns. Referral to Psychology may be required if no improvement is seen as she  continues in our clinic.  Obesity Colleen Zuniga is currently in the action stage of change and her goal is to continue with weight loss efforts. I recommend Chloemarie begin the structured treatment plan as follows:  She has agreed to follow the Category 3 plan + Category 4 microwave meal choices. Nobie has been instructed to eventually work up to a goal of 150 minutes of combined cardio and strengthening exercise per week for weight loss and overall health benefits. We discussed the following Behavioral Modification Strategies today: increasing lean protein intake, decreasing simple carbohydrates, work on meal planning and easy cooking plans, and dealing with family or coworker sabotage.   She was informed of the importance of frequent follow up visits to maximize her success with intensive lifestyle modifications for her multiple health conditions. She was informed we would discuss her lab results at her next visit unless there is a critical issue that needs to be addressed sooner. Colleen Zuniga agreed to keep her next visit at the agreed upon time to discuss these results.  ALLERGIES: Allergies  Allergen Reactions  . Penicillins Anaphylaxis and Other (See Comments)    CLOSES THROAT PATIENT HAD A PCN REACTION WITH IMMEDIATE RASH, FACIAL/TONGUE/THROAT SWELLING, SOB, OR LIGHTHEADEDNESS WITH HYPOTENSION:  #  #  #  YES  #  #  #   Has patient had a PCN reaction causing severe rash involving mucus membranes or skin necrosis: UNKNOWN  Has patient had a PCN reaction that required hospitalization:No Has patient had a PCN reaction occurring within the last 10 years:No   . Hydrocodone-Acetaminophen     Nausea/vomiting with or without food  . Adhesive [Tape] Rash    Specifically the pink tape caused redness and itching     MEDICATIONS: Current Outpatient Medications on File Prior to Visit  Medication Sig Dispense Refill  . acetaminophen (TYLENOL) 500 MG tablet Take 1,000 mg by mouth every 6 (six) hours as needed for  mild pain.     Marland Kitchen ALPRAZolam (XANAX) 0.5 MG tablet Take 0.25-0.5 mg by mouth at bedtime as needed for sleep or anxiety.  3  . Ascorbic Acid (VITAMIN C WITH ROSE HIPS) 1000 MG tablet Take 1,000 mg by mouth daily after breakfast.    . atorvastatin (LIPITOR) 10 MG tablet Take 10 mg by mouth daily after supper.    Marland Kitchen buPROPion (WELLBUTRIN XL) 300 MG 24 hr tablet Take 300 mg by mouth daily before breakfast.    . Continuous Glucose Monitor DEVI 1 Device by Other route continuous.    Marland Kitchen desloratadine (CLARINEX) 5 MG tablet Take 5 mg by mouth daily before breakfast.    . etodolac (LODINE) 500 MG tablet Take 500 mg by mouth daily after breakfast.    . gabapentin (NEURONTIN) 100 MG capsule Take 100-300 mg by mouth 3 (three) times daily. Take 1 capsule (100 mg) by mouth first thing in the morning, take 1 capsule (100 mg) by mouth at lunch, & take 3 capsules (300 mg) at bedtime    . Insulin Human (INSULIN PUMP) SOLN Inject into the skin continuous. Novolog    .  irbesartan (AVAPRO) 300 MG tablet Take 300 mg by mouth daily after breakfast.    . levothyroxine (SYNTHROID, LEVOTHROID) 137 MCG tablet Take 137 mcg by mouth daily before breakfast.    . Magnesium 500 MG TABS Take 500 mg by mouth daily after breakfast.    . metoCLOPramide (REGLAN) 10 MG tablet Take 10 mg by mouth daily as needed for nausea.    . Multiple Vitamin (MULTIVITAMIN WITH MINERALS) TABS tablet Take 1 tablet by mouth daily after breakfast.    . potassium gluconate 595 (99 K) MG TABS tablet Take 595 mg by mouth daily after breakfast.     No current facility-administered medications on file prior to visit.     PAST MEDICAL HISTORY: Past Medical History:  Diagnosis Date  . Anxiety   . Arthritis   . Back pain   . De Quervain's tenosynovitis, right 01/2012  . Degenerative disc disease, cervical    states neck is stiff, reduced range of motion  . Dental crowns present   . Depression   . Diabetes mellitus    Insulin pump  . GERD  (gastroesophageal reflux disease)   . Heart murmur    states has a functional murmur, and that she has never had any problems  . Hypothyroidism   . Joint pain   . PONV (postoperative nausea and vomiting)     PAST SURGICAL HISTORY: Past Surgical History:  Procedure Laterality Date  . ANTERIOR CERVICAL DISCECTOMY  02/20/2016  . ANTERIOR LAT LUMBAR FUSION N/A 08/18/2016   Procedure: ANTERIOR LATERAL LUMBAR FUSION LUMBAR TWO- LUMBAR THREE, LUMBAR THREE- LUMBAR FOUR, LUMBAR FOUR- LUMBAR FIVE; LUMBAR TWO- LUMBAR FIVE PEDICLE SCREW FIXATION;  Surgeon: Shirlean Kelly, MD;  Location: [facility] OR;  Service: Neurosurgery;  Laterality: N/A;  ANTERIOR LATERAL LUMBAR FUSION LUMBAR 2- LUMBAR 3, LUMBAR 3- LUMBAR 4-, LUMBAR 4- LUMBAR 5; LUMBAR 2- LUMBAR 5 PEDICLE SCREW FIXATION  . CESAREAN SECTION    . DORSAL COMPARTMENT RELEASE  02/08/2002   first dorsal compartment left wrist  . DORSAL COMPARTMENT RELEASE  02/03/2012   Procedure: RELEASE DORSAL COMPARTMENT (DEQUERVAIN);  Surgeon: Nicki Reaper, MD;  Location: [facility];  Service: Orthopedics;  Laterality: Right;  RELEASE DEQUERVAINS RIGHT WRIST  . KNEE ARTHROSCOPY  10/14/2004   right  . SHOULDER ARTHROSCOPY  05/05/2005   left  . TRIGGER FINGER RELEASE  01/07/2011   release A1 pulley left thumb  . TRIGGER FINGER RELEASE     x 5 other fingers    SOCIAL HISTORY: Social History   Tobacco Use  . Smoking status: Former Smoker    Last attempt to quit: 03/11/1991    Years since quitting: 27.1  . Smokeless tobacco: Never Used  Substance Use Topics  . Alcohol use: Yes    Comment: rarely  . Drug use: No    FAMILY HISTORY: Family History  Problem Relation Age of Onset  . Stroke Father   . Diabetes Father   . Heart disease Father   . Kidney disease Father   . Obesity Father     ROS: Review of Systems  Constitutional: Negative for weight loss.  Cardiovascular: Negative for orthopnea.  Gastrointestinal: Positive for nausea.    Musculoskeletal: Positive for back pain, joint pain, myalgias and neck pain.       Positive for neck stiffness.  Skin: Positive for itching.  Endo/Heme/Allergies:       Positive for heat intolerance. Positive for hypoglycemia.  Psychiatric/Behavioral:       Positive  for stress.    PHYSICAL EXAM: Blood pressure 119/70, pulse 80, height 5\' 2"  (1.575 m), weight 191 lb (86.6 kg), SpO2 99 %. Body mass index is 34.93 kg/m. Physical Exam Vitals signs reviewed.  Constitutional:      Appearance: Normal appearance. She is obese.  HENT:     Head: Normocephalic and atraumatic.     Nose: Nose normal.  Eyes:     General: No scleral icterus.    Extraocular Movements: Extraocular movements intact.  Neck:     Musculoskeletal: Normal range of motion and neck supple.     Thyroid: No thyromegaly.     Comments: Negative for thyromegaly. Cardiovascular:     Rate and Rhythm: Normal rate and regular rhythm.  Pulmonary:     Effort: Pulmonary effort is normal. No respiratory distress.  Abdominal:     Palpations: Abdomen is soft.     Tenderness: There is no abdominal tenderness.     Comments: Positive for obesity.  Musculoskeletal:     Comments: ROM normal in all extremities.  Skin:    General: Skin is warm and dry.  Neurological:     Mental Status: She is alert and oriented to person, place, and time.     Coordination: Coordination normal.  Psychiatric:        Mood and Affect: Mood normal.        Behavior: Behavior normal.     RECENT LABS AND TESTS: BMET    Component Value Date/Time   NA 138 08/21/2016 1711   K 4.4 08/21/2016 1711   CL 103 08/21/2016 1711   CO2 28 08/21/2016 1711   GLUCOSE 119 (H) 08/21/2016 1711   BUN 10 08/21/2016 1711   CREATININE 0.71 08/21/2016 1711   CALCIUM 9.2 08/21/2016 1711   GFRNONAA >60 08/21/2016 1711   GFRAA >60 08/21/2016 1711   Lab Results  Component Value Date   HGBA1C 6.1 (H) 08/11/2016   No results found for: INSULIN CBC    Component  Value Date/Time   WBC 5.3 08/21/2016 1711   RBC 3.03 (L) 08/21/2016 1711   HGB 9.5 (L) 08/21/2016 1711   HCT 28.9 (L) 08/21/2016 1711   PLT 170 08/21/2016 1711   MCV 95.4 08/21/2016 1711   MCH 31.4 08/21/2016 1711   MCHC 32.9 08/21/2016 1711   RDW 11.8 08/21/2016 1711   Iron/TIBC/Ferritin/ %Sat No results found for: IRON, TIBC, FERRITIN, IRONPCTSAT Lipid Panel  No results found for: CHOL, TRIG, HDL, CHOLHDL, VLDL, LDLCALC, LDLDIRECT Hepatic Function Panel     Component Value Date/Time   PROT 6.0 (L) 08/21/2016 1711   ALBUMIN 3.3 (L) 08/21/2016 1711   AST 155 (H) 08/21/2016 1711   ALT 76 (H) 08/21/2016 1711   ALKPHOS 94 08/21/2016 1711   BILITOT 1.0 08/21/2016 1711   No results found for: TSH  ECG  shows NSR with a rate of 76 BPM INDIRECT CALORIMETER done today shows a VO2 of 332 and a REE of 2314.  Her calculated basal metabolic rate is 1610 thus her basal metabolic rate is better than expected.   OBESITY BEHAVIORAL INTERVENTION VISIT  Today's visit was # 1   Starting weight: 191 lbs Starting date: 04/29/18 Today's weight : Weight: 191 lb (86.6 kg)  Today's date: 04/29/2018 Total lbs lost to date: 0    04/29/2018  Height 5\' 2"  (1.575 m)  Weight 191 lb (86.6 kg)  BMI (Calculated) 34.93  BLOOD PRESSURE - SYSTOLIC 119  BLOOD PRESSURE - DIASTOLIC 70  Waist  Measurement  37 inches   Body Fat % 46.4 %  Total Body Water (lbs) 78 lbs  RMR 2314    ASK: We discussed the diagnosis of obesity with Colleen Dust today and Wilsie agreed to give [place] permission to discuss obesity behavioral modification therapy today.  ASSESS: Brandie has the diagnosis of obesity and her BMI today is 34.9. Chaquita is in the action stage of change.   ADVISE: Maryiah was educated on the multiple health risks of obesity as well as the benefit of weight loss to improve her health. She was advised of the need for long term treatment and the importance of lifestyle modifications to improve her current  health and to decrease her risk of future health problems.  AGREE: Multiple dietary modification options and treatment options were discussed and Solash agreed to follow the recommendations documented in the above note.  ARRANGE: Meleah was educated on the importance of frequent visits to treat obesity as outlined per CMS and USPSTF guidelines and agreed to schedule her next follow up appointment today.  IKirke Corin, CMA, am acting as transcriptionist for Wilder Glade, MD   I have reviewed the above documentation for accuracy and completeness, and I agree with the above. -Quillian Quince, MD

## 2018-04-30 LAB — MICROALBUMIN / CREATININE URINE RATIO
Creatinine, Urine: 13.1 mg/dL
Microalbumin, Urine: <3 ug/mL

## 2018-04-30 LAB — CBC WITH DIFFERENTIAL
Basophils Absolute: 0 10*3/uL (ref 0.0–0.2)
Basos: 1 %
EOS (ABSOLUTE): 0 10*3/uL (ref 0.0–0.4)
EOS: 0 %
HEMATOCRIT: 34.7 % (ref 34.0–46.6)
HEMOGLOBIN: 11.5 g/dL (ref 11.1–15.9)
IMMATURE GRANS (ABS): 0 10*3/uL (ref 0.0–0.1)
Immature Granulocytes: 0 %
LYMPHS ABS: 1.9 10*3/uL (ref 0.7–3.1)
LYMPHS: 43 %
MCH: 30.8 pg (ref 26.6–33.0)
MCHC: 33.1 g/dL (ref 31.5–35.7)
MCV: 93 fL (ref 79–97)
MONOCYTES: 10 %
Monocytes Absolute: 0.5 10*3/uL (ref 0.1–0.9)
Neutrophils Absolute: 2.1 10*3/uL (ref 1.4–7.0)
Neutrophils: 46 %
RBC: 3.73 x10E6/uL — AB (ref 3.77–5.28)
RDW: 11.6 % — AB (ref 11.7–15.4)
WBC: 4.5 10*3/uL (ref 3.4–10.8)

## 2018-04-30 LAB — VITAMIN B12: VITAMIN B 12: 330 pg/mL (ref 232–1245)

## 2018-04-30 LAB — LIPID PANEL WITH LDL/HDL RATIO
Cholesterol, Total: 156 mg/dL (ref 100–199)
HDL: 74 mg/dL (ref >39)
LDL Calculated: 67 mg/dL (ref 0–99)
LDL/HDL RATIO: 0.9 ratio (ref 0.0–3.2)
Triglycerides: 76 mg/dL (ref 0–149)
VLDL CHOLESTEROL CAL: 15 mg/dL (ref 5–40)

## 2018-04-30 LAB — COMPREHENSIVE METABOLIC PANEL
ALT: 27 IU/L (ref 0–32)
AST: 29 IU/L (ref 0–40)
Albumin/Globulin Ratio: 2.2 (ref 1.2–2.2)
Albumin: 4.4 g/dL (ref 3.8–4.9)
Alkaline Phosphatase: 111 IU/L (ref 39–117)
BILIRUBIN TOTAL: 0.4 mg/dL (ref 0.0–1.2)
BUN/Creatinine Ratio: 24 — ABNORMAL HIGH (ref 9–23)
BUN: 18 mg/dL (ref 6–24)
CALCIUM: 9.5 mg/dL (ref 8.7–10.2)
CHLORIDE: 102 mmol/L (ref 96–106)
CO2: 23 mmol/L (ref 20–29)
Creatinine, Ser: 0.74 mg/dL (ref 0.57–1.00)
GFR calc non Af Amer: 89 mL/min/{1.73_m2} (ref >59)
GFR, EST AFRICAN AMERICAN: 103 mL/min/{1.73_m2} (ref >59)
GLUCOSE: 94 mg/dL (ref 65–99)
Globulin, Total: 2 g/dL (ref 1.5–4.5)
Potassium: 5.1 mmol/L (ref 3.5–5.2)
Sodium: 139 mmol/L (ref 134–144)
TOTAL PROTEIN: 6.4 g/dL (ref 6.0–8.5)

## 2018-04-30 LAB — T3: T3, Total: 97 ng/dL (ref 71–180)

## 2018-04-30 LAB — HEMOGLOBIN A1C
ESTIMATED AVERAGE GLUCOSE: 131 mg/dL
HEMOGLOBIN A1C: 6.2 % — AB (ref 4.8–5.6)

## 2018-04-30 LAB — VITAMIN D 25 HYDROXY (VIT D DEFICIENCY, FRACTURES): VIT D 25 HYDROXY: 41.3 ng/mL (ref 30.0–100.0)

## 2018-04-30 LAB — FOLATE: Folate: 18.2 ng/mL (ref >3.0)

## 2018-04-30 LAB — TSH: TSH: 0.567 u[IU]/mL (ref 0.450–4.500)

## 2018-04-30 LAB — T4, FREE: FREE T4: 1.35 ng/dL (ref 0.82–1.77)

## 2018-05-05 ENCOUNTER — Telehealth (INDEPENDENT_AMBULATORY_CARE_PROVIDER_SITE_OTHER): Payer: Self-pay | Admitting: Family Medicine

## 2018-05-05 NOTE — Telephone Encounter (Signed)
The patient called and wants call back regarding her labs.

## 2018-05-06 ENCOUNTER — Ambulatory Visit (INDEPENDENT_AMBULATORY_CARE_PROVIDER_SITE_OTHER): Payer: Self-pay | Admitting: Bariatrics

## 2018-05-12 ENCOUNTER — Encounter (INDEPENDENT_AMBULATORY_CARE_PROVIDER_SITE_OTHER): Payer: Self-pay | Admitting: Family Medicine

## 2018-05-13 ENCOUNTER — Ambulatory Visit (INDEPENDENT_AMBULATORY_CARE_PROVIDER_SITE_OTHER): Payer: PRIVATE HEALTH INSURANCE | Admitting: Family Medicine

## 2018-05-13 ENCOUNTER — Encounter (INDEPENDENT_AMBULATORY_CARE_PROVIDER_SITE_OTHER): Payer: Self-pay | Admitting: Family Medicine

## 2018-05-13 VITALS — BP 133/68 | HR 73 | Ht 62.0 in | Wt 188.0 lb

## 2018-05-13 DIAGNOSIS — E559 Vitamin D deficiency, unspecified: Secondary | ICD-10-CM | POA: Diagnosis not present

## 2018-05-13 DIAGNOSIS — Z6834 Body mass index (BMI) 34.0-34.9, adult: Secondary | ICD-10-CM

## 2018-05-13 DIAGNOSIS — E669 Obesity, unspecified: Secondary | ICD-10-CM | POA: Diagnosis not present

## 2018-05-13 DIAGNOSIS — E109 Type 1 diabetes mellitus without complications: Secondary | ICD-10-CM

## 2018-05-14 ENCOUNTER — Encounter (INDEPENDENT_AMBULATORY_CARE_PROVIDER_SITE_OTHER): Payer: Self-pay | Admitting: Family Medicine

## 2018-05-15 NOTE — Progress Notes (Signed)
Office: [phone]  /  Fax: [phone]   HPI:   Chief Complaint: OBESITY Colleen Zuniga is here to discuss her progress with her obesity treatment plan. She is on the Category 3 plan with Category 4 plan microwave meal choices and is following her eating plan approximately 100 % of the time. She states she is exercising 0 minutes 0 times per week. Colleen Zuniga did very well with weight loss on her Category 3 plan. Her hunger was controlled and she liked her food options.  Her weight is 188 lb (85.3 kg) today and has had a weight loss of 3 pounds over a period of 2 weeks since her last visit. She has lost 3 lbs since starting treatment with [place].  Diabetes I Colleen Zuniga has a diagnosis of diabetes type I. Colleen Zuniga has been on insulin pump and CGM and she is doing very well with BGs control. She had her carb ratio changed to 1:8 from 1:9 by her pharm D yesterday in the morning and she is doing well. She denies hypoglycemia. Recent A1c was 6.2. She has been working on intensive lifestyle modifications including diet, exercise, and weight loss to help control her blood glucose levels.  Vitamin D Deficiency Colleen Zuniga has a diagnosis of vitamin D deficiency. She is on OTC Ca+ + Vit D, and level is not quite at goal but close. She denies nausea, vomiting or muscle weakness.  ASSESSMENT AND PLAN:  Type 1 diabetes mellitus without complication (HCC)  Vitamin D deficiency  Class 1 obesity with serious comorbidity and body mass index (BMI) of 34.0 to 34.9 in adult, unspecified obesity type  PLAN:  Diabetes I Colleen Zuniga has been given extensive diabetes education by myself today including ideal fasting and post-prandial blood glucose readings, individual ideal Hgb A1c goals and hypoglycemia prevention. We discussed the importance of good blood sugar control to decrease the likelihood of diabetic complications such as nephropathy, neuropathy, limb loss, blindness, coronary artery disease, and death. We discussed the importance of  intensive lifestyle modification including diet, exercise and weight loss as the first line treatment for diabetes. Colleen Zuniga agrees to continue her diet prescription as is and will continue to monitor. Colleen Zuniga agrees to follow up with our clinic in 2 weeks.  Vitamin D Deficiency Colleen Zuniga was informed that low vitamin D levels contributes to fatigue and are associated with obesity, breast, and colon cancer. Colleen Zuniga agrees to increase Ca+ + Vit D to BID OTC. She will follow up for routine testing of vitamin D, at least 2-3 times per year. She was informed of the risk of over-replacement of vitamin D and agrees to not increase her dose unless she discusses this with [place] first. We will recheck labs in 3 months. Colleen Zuniga agrees to follow up with our clinic in 2 weeks.  I spent > than 50% of the 25 minute visit on counseling as documented in the note.  Obesity Colleen Zuniga is currently in the action stage of change. As such, her goal is to continue with weight loss efforts She has agreed to follow the Category 3 plan with Category 4 plan microwave meal choices Colleen Zuniga has been instructed to work up to a goal of 150 minutes of combined cardio and strengthening exercise per week for weight loss and overall health benefits. We discussed the following Behavioral Modification Strategies today: increasing lean protein intake, decreasing simple carbohydrates  and work on meal planning and easy cooking plans, and planning for success   Colleen Zuniga has agreed to follow up with our  clinic in 2 weeks. She was informed of the importance of frequent follow up visits to maximize her success with intensive lifestyle modifications for her multiple health conditions.  ALLERGIES: Allergies  Allergen Reactions  . Penicillins Anaphylaxis and Other (See Comments)    CLOSES THROAT PATIENT HAD A PCN REACTION WITH IMMEDIATE RASH, FACIAL/TONGUE/THROAT SWELLING, SOB, OR LIGHTHEADEDNESS WITH HYPOTENSION:  #  #  #  YES  #  #  #   Has patient had a PCN  reaction causing severe rash involving mucus membranes or skin necrosis: UNKNOWN  Has patient had a PCN reaction that required hospitalization:No Has patient had a PCN reaction occurring within the last 10 years:No   . Hydrocodone-Acetaminophen     Nausea/vomiting with or without food  . Adhesive [Tape] Rash    Specifically the pink tape caused redness and itching     MEDICATIONS: Current Outpatient Medications on File Prior to Visit  Medication Sig Dispense Refill  . acetaminophen (TYLENOL) 500 MG tablet Take 1,000 mg by mouth every 6 (six) hours as needed for mild pain.     Marland Kitchen ALPRAZolam (XANAX) 0.5 MG tablet Take 0.25-0.5 mg by mouth at bedtime as needed for sleep or anxiety.  3  . Ascorbic Acid (VITAMIN C WITH ROSE HIPS) 1000 MG tablet Take 1,000 mg by mouth daily after breakfast.    . atorvastatin (LIPITOR) 10 MG tablet Take 10 mg by mouth daily after supper.    Marland Kitchen buPROPion (WELLBUTRIN XL) 300 MG 24 hr tablet Take 300 mg by mouth daily before breakfast.    . Continuous Glucose Monitor DEVI 1 Device by Other route continuous.    Marland Kitchen desloratadine (CLARINEX) 5 MG tablet Take 5 mg by mouth daily before breakfast.    . etodolac (LODINE) 500 MG tablet Take 500 mg by mouth daily after breakfast.    . gabapentin (NEURONTIN) 100 MG capsule Take 100-300 mg by mouth 3 (three) times daily. Take 1 capsule (100 mg) by mouth first thing in the morning, take 1 capsule (100 mg) by mouth at lunch, & take 3 capsules (300 mg) at bedtime    . Insulin Human (INSULIN PUMP) SOLN Inject into the skin continuous. Novolog    . irbesartan (AVAPRO) 300 MG tablet Take 300 mg by mouth daily after breakfast.    . levothyroxine (SYNTHROID, LEVOTHROID) 137 MCG tablet Take 137 mcg by mouth daily before breakfast.    . Magnesium 500 MG TABS Take 500 mg by mouth daily after breakfast.    . metoCLOPramide (REGLAN) 10 MG tablet Take 10 mg by mouth daily as needed for nausea.    . Multiple Vitamin (MULTIVITAMIN WITH MINERALS)  TABS tablet Take 1 tablet by mouth daily after breakfast.    . potassium gluconate 595 (99 K) MG TABS tablet Take 595 mg by mouth daily after breakfast.     No current facility-administered medications on file prior to visit.     PAST MEDICAL HISTORY: Past Medical History:  Diagnosis Date  . Anxiety   . Arthritis   . Back pain   . De Quervain's tenosynovitis, right 01/2012  . Degenerative disc disease, cervical    states neck is stiff, reduced range of motion  . Dental crowns present   . Depression   . Diabetes mellitus    Insulin pump  . GERD (gastroesophageal reflux disease)   . Heart murmur    states has a functional murmur, and that she has never had any problems  . Hypothyroidism   .  Joint pain   . PONV (postoperative nausea and vomiting)     PAST SURGICAL HISTORY: Past Surgical History:  Procedure Laterality Date  . ANTERIOR CERVICAL DISCECTOMY  02/20/2016  . ANTERIOR LAT LUMBAR FUSION N/A 08/18/2016   Procedure: ANTERIOR LATERAL LUMBAR FUSION LUMBAR TWO- LUMBAR THREE, LUMBAR THREE- LUMBAR FOUR, LUMBAR FOUR- LUMBAR FIVE; LUMBAR TWO- LUMBAR FIVE PEDICLE SCREW FIXATION;  Surgeon: Shirlean Kelly, MD;  Location: [facility] OR;  Service: Neurosurgery;  Laterality: N/A;  ANTERIOR LATERAL LUMBAR FUSION LUMBAR 2- LUMBAR 3, LUMBAR 3- LUMBAR 4-, LUMBAR 4- LUMBAR 5; LUMBAR 2- LUMBAR 5 PEDICLE SCREW FIXATION  . CESAREAN SECTION    . DORSAL COMPARTMENT RELEASE  02/08/2002   first dorsal compartment left wrist  . DORSAL COMPARTMENT RELEASE  02/03/2012   Procedure: RELEASE DORSAL COMPARTMENT (DEQUERVAIN);  Surgeon: Nicki Reaper, MD;  Location: [facility];  Service: Orthopedics;  Laterality: Right;  RELEASE DEQUERVAINS RIGHT WRIST  . KNEE ARTHROSCOPY  10/14/2004   right  . SHOULDER ARTHROSCOPY  05/05/2005   left  . TRIGGER FINGER RELEASE  01/07/2011   release A1 pulley left thumb  . TRIGGER FINGER RELEASE     x 5 other fingers    SOCIAL HISTORY: Social History   Tobacco  Use  . Smoking status: Former Smoker    Last attempt to quit: 03/11/1991    Years since quitting: 27.1  . Smokeless tobacco: Never Used  Substance Use Topics  . Alcohol use: Yes    Comment: rarely  . Drug use: No    FAMILY HISTORY: Family History  Problem Relation Age of Onset  . Stroke Father   . Diabetes Father   . Heart disease Father   . Kidney disease Father   . Obesity Father     ROS: Review of Systems  Constitutional: Positive for weight loss.  Gastrointestinal: Negative for nausea and vomiting.  Musculoskeletal:       Negative muscle weakness  Endo/Heme/Allergies:       Negative hypoglycemia    PHYSICAL EXAM: Blood pressure 133/68, pulse 73, height  (1.575 m), weight 188 lb (85.3 kg), SpO2 99 %. Body mass index is 34.39 kg/m. Physical Exam Vitals signs reviewed.  Constitutional:      Appearance: Normal appearance. She is obese.  Cardiovascular:     Rate and Rhythm: Normal rate.     Pulses: Normal pulses.  Pulmonary:     Effort: Pulmonary effort is normal.     Breath sounds: Normal breath sounds.  Musculoskeletal: Normal range of motion.  Skin:    General: Skin is warm and dry.  Neurological:     Mental Status: She is alert and oriented to person, place, and time.  Psychiatric:        Mood and Affect: Mood normal.        Behavior: Behavior normal.     RECENT LABS AND TESTS: BMET    Component Value Date/Time   NA 139 04/29/2018 1354   K 5.1 04/29/2018 1354   CL 102 04/29/2018 1354   CO2 23 04/29/2018 1354   GLUCOSE 94 04/29/2018 1354   GLUCOSE 119 (H) 08/21/2016 1711   BUN 18 04/29/2018 1354   CREATININE 0.74 04/29/2018 1354   CALCIUM 9.5 04/29/2018 1354   GFRNONAA 89 04/29/2018 1354   GFRAA 103 04/29/2018 1354   Lab Results  Component Value Date   HGBA1C 6.2 (H) 04/29/2018   HGBA1C 6.1 (H) 08/11/2016   No results found for: INSULIN CBC  Component Value Date/Time   WBC 4.5 04/29/2018 1354   WBC 5.3 08/21/2016 1711   RBC  3.73 (L) 04/29/2018 1354   RBC 3.03 (L) 08/21/2016 1711   HGB 11.5 04/29/2018 1354   HCT 34.7 04/29/2018 1354   PLT 170 08/21/2016 1711   MCV 93 04/29/2018 1354   MCH 30.8 04/29/2018 1354   MCH 31.4 08/21/2016 1711   MCHC 33.1 04/29/2018 1354   MCHC 32.9 08/21/2016 1711   RDW 11.6 (L) 04/29/2018 1354   LYMPHSABS 1.9 04/29/2018 1354   EOSABS 0.0 04/29/2018 1354   BASOSABS 0.0 04/29/2018 1354   Iron/TIBC/Ferritin/ %Sat No results found for: IRON, TIBC, FERRITIN, IRONPCTSAT Lipid Panel     Component Value Date/Time   CHOL 156 04/29/2018 1354   TRIG 76 04/29/2018 1354   HDL 74 04/29/2018 1354   LDLCALC 67 04/29/2018 1354   Hepatic Function Panel     Component Value Date/Time   PROT 6.4 04/29/2018 1354   ALBUMIN 4.4 04/29/2018 1354   AST 29 04/29/2018 1354   ALT 27 04/29/2018 1354   ALKPHOS 111 04/29/2018 1354   BILITOT 0.4 04/29/2018 1354      Component Value Date/Time   TSH 0.567 04/29/2018 1354      OBESITY BEHAVIORAL INTERVENTION VISIT  Today's visit was # 2   Starting weight: 191 lbs Starting date: 04/29/2018 Today's weight : 188 lbs  Today's date: 05/13/2018 Total lbs lost to date: 3    05/13/2018  Height 5\' 2"  (1.575 m)  Weight 188 lb (85.3 kg)  BMI (Calculated) 34.38  BLOOD PRESSURE - SYSTOLIC 133  BLOOD PRESSURE - DIASTOLIC 68   Body Fat % 44.7 %  Total Body Water (lbs) 74.4 lbs     ASK: We discussed the diagnosis of obesity with [name] today and Avaleen agreed to give [place] permission to discuss obesity behavioral modification therapy today.  ASSESS: Ardyth has the diagnosis of obesity and her BMI today is 34.38 Litasha is in the action stage of change   ADVISE: Aidel was educated on the multiple health risks of obesity as well as the benefit of weight loss to improve her health. She was advised of the need for long term treatment and the importance of lifestyle modifications to improve her current health and to decrease her risk of future  health problems.  AGREE: Multiple dietary modification options and treatment options were discussed and  Cabella agreed to follow the recommendations documented in the above note.  ARRANGE: Antanisha was educated on the importance of frequent visits to treat obesity as outlined per CMS and USPSTF guidelines and agreed to schedule her next follow up appointment today.  I, Burt Knack, am acting as transcriptionist for Quillian Quince, MD  I have reviewed the above documentation for accuracy and completeness, and I agree with the above. -Quillian Quince, MD

## 2018-06-03 ENCOUNTER — Encounter (INDEPENDENT_AMBULATORY_CARE_PROVIDER_SITE_OTHER): Payer: Self-pay

## 2018-06-03 ENCOUNTER — Ambulatory Visit (INDEPENDENT_AMBULATORY_CARE_PROVIDER_SITE_OTHER): Payer: PRIVATE HEALTH INSURANCE | Admitting: Family Medicine

## 2018-06-07 ENCOUNTER — Other Ambulatory Visit: Payer: Self-pay

## 2018-06-07 ENCOUNTER — Ambulatory Visit (INDEPENDENT_AMBULATORY_CARE_PROVIDER_SITE_OTHER): Payer: PRIVATE HEALTH INSURANCE | Admitting: Family Medicine

## 2018-06-07 ENCOUNTER — Encounter (INDEPENDENT_AMBULATORY_CARE_PROVIDER_SITE_OTHER): Payer: Self-pay | Admitting: Family Medicine

## 2018-06-07 DIAGNOSIS — E109 Type 1 diabetes mellitus without complications: Secondary | ICD-10-CM

## 2018-06-07 DIAGNOSIS — Z6834 Body mass index (BMI) 34.0-34.9, adult: Secondary | ICD-10-CM | POA: Diagnosis not present

## 2018-06-07 DIAGNOSIS — E669 Obesity, unspecified: Secondary | ICD-10-CM | POA: Diagnosis not present

## 2018-06-07 DIAGNOSIS — E559 Vitamin D deficiency, unspecified: Secondary | ICD-10-CM

## 2018-06-08 NOTE — Progress Notes (Signed)
Office: [phone]  /  Fax: [phone] TeleHealth Visit:  Colleen Zuniga has consented to this TeleHealth visit today via telephone call. The patient is located at home, the provider is located at the [facility] office. The participants in this visit include the listed provider and patient.   HPI:   Chief Complaint: OBESITY Colleen Zuniga is here to discuss her progress with her obesity treatment plan. She is on the Category 3 plan with Category 4 microwave meal choices plan and is following her eating plan approximately 100 % of the time. She states she is exercising 0 minutes 0 times per week. Colleen Zuniga has been working from home, and had  To avoid weight gain while being on COVID-19 lockdown. She has been sick with bronchitis and sometimes skipping meals. She has added another 100 calorie snack at night if her blood sugars are below 100. We were unable to weight the patient today for this TeleHealth visit. She feels as if she has maintained her weight since her last visit. She has lost 3 lbs since starting treatment with [place].  Diabetes I Colleen Zuniga has a diagnosis of diabetes type I. [name] is on an insulin pump. Her last A1c was 6.2, but she has had some hypoglycemic episodes in the last 1-2 weeks. She has had bronchitis recently as well. She has been working on intensive lifestyle modifications including diet, exercise, and weight loss to help control her blood glucose levels.  Vitamin D Deficiency Colleen Zuniga has a diagnosis of vitamin D deficiency. She is stable on Vit D OTC, but level is not yet at goal. She denies nausea, vomiting or muscle weakness.  ASSESSMENT AND PLAN:  Type 1 diabetes mellitus without complication (HCC)  Vitamin D deficiency  Class 1 obesity with serious comorbidity and body mass index (BMI) of 34.0 to 34.9 in adult, unspecified obesity type  PLAN:  Diabetes I Colleen Zuniga has been given extensive diabetes education by myself today including ideal fasting and  post-prandial blood glucose readings, individual ideal Hgb A1c goals and hypoglycemia prevention. We discussed the importance of good blood sugar control to decrease the likelihood of diabetic complications such as nephropathy, neuropathy, limb loss, blindness, coronary artery disease, and death. We discussed the importance of intensive lifestyle modification including diet, exercise and weight loss as the first line treatment for diabetes. Colleen Zuniga is to change her new pump this week and she is to discuss adjusting her Basal level with CDE. Angles agrees to follow up with our clinic in 2 weeks.  Vitamin D Deficiency Colleen Zuniga was informed that low vitamin D levels contributes to fatigue and are associated with obesity, breast, and colon cancer. Colleen Zuniga agrees to continue taking Ca+ and OTC Vit D. She will follow up for routine testing of vitamin D, at least 2-3 times per year. She was informed of the risk of over-replacement of vitamin D and agrees to not increase her dose unless she discusses this with [place] first. We will recheck labs in 6 weeks. Colleen Zuniga agrees to follow up with our clinic in 2 weeks.  I spent > than 50% of the 25 minute visit on counseling as documented in the note.  Obesity Colleen Zuniga is currently in the action stage of change. As such, her goal is to continue with weight loss efforts She has agreed to follow the Category 2 plan Colleen Zuniga has been instructed to work up to a goal of 150 minutes of combined cardio and strengthening exercise per week for weight loss and overall  health benefits. We discussed the following Behavioral Modification Strategies today: decreasing simple carbohydrates, work on meal planning and easy cooking plans, emotional eating strategies, ways to avoid boredom eating, ways to avoid night time snacking, keeping healthy foods in the home, and better snacking choices   Colleen Zuniga has agreed to follow up with our clinic in 2 weeks. She was informed of the importance of frequent follow  up visits to maximize her success with intensive lifestyle modifications for her multiple health conditions.  ALLERGIES: Allergies  Allergen Reactions  . Penicillins Anaphylaxis and Other (See Comments)    CLOSES THROAT PATIENT HAD A PCN REACTION WITH IMMEDIATE RASH, FACIAL/TONGUE/THROAT SWELLING, SOB, OR LIGHTHEADEDNESS WITH HYPOTENSION:  #  #  #  YES  #  #  #   Has patient had a PCN reaction causing severe rash involving mucus membranes or skin necrosis: UNKNOWN  Has patient had a PCN reaction that required hospitalization:No Has patient had a PCN reaction occurring within the last 10 years:No   . Hydrocodone-Acetaminophen     Nausea/vomiting with or without food  . Adhesive [Tape] Rash    Specifically the pink tape caused redness and itching     MEDICATIONS: Current Outpatient Medications on File Prior to Visit  Medication Sig Dispense Refill  . acetaminophen (TYLENOL) 500 MG tablet Take 1,000 mg by mouth every 6 (six) hours as needed for mild pain.     Marland Kitchen ALPRAZolam (XANAX) 0.5 MG tablet Take 0.25-0.5 mg by mouth at bedtime as needed for sleep or anxiety.  3  . Ascorbic Acid (VITAMIN C WITH ROSE HIPS) 1000 MG tablet Take 1,000 mg by mouth daily after breakfast.    . atorvastatin (LIPITOR) 10 MG tablet Take 10 mg by mouth daily after supper.    Marland Kitchen buPROPion (WELLBUTRIN XL) 300 MG 24 hr tablet Take 300 mg by mouth daily before breakfast.    . Continuous Glucose Monitor DEVI 1 Device by Other route continuous.    Marland Kitchen desloratadine (CLARINEX) 5 MG tablet Take 5 mg by mouth daily before breakfast.    . etodolac (LODINE) 500 MG tablet Take 500 mg by mouth daily after breakfast.    . gabapentin (NEURONTIN) 100 MG capsule Take 100-300 mg by mouth 3 (three) times daily. Take 1 capsule (100 mg) by mouth first thing in the morning, take 1 capsule (100 mg) by mouth at lunch, & take 3 capsules (300 mg) at bedtime    . Insulin Human (INSULIN PUMP) SOLN Inject into the skin continuous. Novolog    .  irbesartan (AVAPRO) 300 MG tablet Take 300 mg by mouth daily after breakfast.    . levothyroxine (SYNTHROID, LEVOTHROID) 137 MCG tablet Take 137 mcg by mouth daily before breakfast.    . Magnesium 500 MG TABS Take 500 mg by mouth daily after breakfast.    . metoCLOPramide (REGLAN) 10 MG tablet Take 10 mg by mouth daily as needed for nausea.    . Multiple Vitamin (MULTIVITAMIN WITH MINERALS) TABS tablet Take 1 tablet by mouth daily after breakfast.    . potassium gluconate 595 (99 K) MG TABS tablet Take 595 mg by mouth daily after breakfast.     No current facility-administered medications on file prior to visit.     PAST MEDICAL HISTORY: Past Medical History:  Diagnosis Date  . Anxiety   . Arthritis   . Back pain   . De Quervain's tenosynovitis, right 01/2012  . Degenerative disc disease, cervical    states neck is  stiff, reduced range of motion  . Dental crowns present   . Depression   . Diabetes mellitus    Insulin pump  . GERD (gastroesophageal reflux disease)   . Heart murmur    states has a functional murmur, and that she has never had any problems  . Hypothyroidism   . Joint pain   . PONV (postoperative nausea and vomiting)     PAST SURGICAL HISTORY: Past Surgical History:  Procedure Laterality Date  . ANTERIOR CERVICAL DISCECTOMY  02/20/2016  . ANTERIOR LAT LUMBAR FUSION N/A 08/18/2016   Procedure: ANTERIOR LATERAL LUMBAR FUSION LUMBAR TWO- LUMBAR THREE, LUMBAR THREE- LUMBAR FOUR, LUMBAR FOUR- LUMBAR FIVE; LUMBAR TWO- LUMBAR FIVE PEDICLE SCREW FIXATION;  Surgeon: Shirlean Kelly, MD;  Location: [facility] OR;  Service: Neurosurgery;  Laterality: N/A;  ANTERIOR LATERAL LUMBAR FUSION LUMBAR 2- LUMBAR 3, LUMBAR 3- LUMBAR 4-, LUMBAR 4- LUMBAR 5; LUMBAR 2- LUMBAR 5 PEDICLE SCREW FIXATION  . CESAREAN SECTION    . DORSAL COMPARTMENT RELEASE  02/08/2002   first dorsal compartment left wrist  . DORSAL COMPARTMENT RELEASE  02/03/2012   Procedure: RELEASE DORSAL COMPARTMENT (DEQUERVAIN);   Surgeon: Nicki Reaper, MD;  [facility];  Service: Orthopedics;  Laterality: Right;  RELEASE DEQUERVAINS RIGHT WRIST  . KNEE ARTHROSCOPY  10/14/2004   right  . SHOULDER ARTHROSCOPY  05/05/2005   left  . TRIGGER FINGER RELEASE  01/07/2011   release A1 pulley left thumb  . TRIGGER FINGER RELEASE     x 5 other fingers    SOCIAL HISTORY: Social History   Tobacco Use  . Smoking status: Former Smoker    Last attempt to quit: 03/11/1991    Years since quitting: 27.2  . Smokeless tobacco: Never Used  Substance Use Topics  . Alcohol use: Yes    Comment: rarely  . Drug use: No    FAMILY HISTORY: Family History  Problem Relation Age of Onset  . Stroke Father   . Diabetes Father   . Heart disease Father   . Kidney disease Father   . Obesity Father     ROS: Review of Systems  Constitutional: Negative for weight loss.  Gastrointestinal: Negative for nausea and vomiting.  Musculoskeletal:       Negative muscle weakness  Endo/Heme/Allergies:       Positive hypoglycemia    PHYSICAL EXAM: Pt in no acute distress  RECENT LABS AND TESTS: BMET    Component Value Date/Time   NA 139 04/29/2018 1354   K 5.1 04/29/2018 1354   CL 102 04/29/2018 1354   CO2 23 04/29/2018 1354   GLUCOSE 94 04/29/2018 1354   GLUCOSE 119 (H) 08/21/2016 1711   BUN 18 04/29/2018 1354   CREATININE 0.74 04/29/2018 1354   CALCIUM 9.5 04/29/2018 1354   GFRNONAA 89 04/29/2018 1354   GFRAA 103 04/29/2018 1354   Lab Results  Component Value Date   HGBA1C 6.2 (H) 04/29/2018   HGBA1C 6.1 (H) 08/11/2016   No results found for: INSULIN CBC    Component Value Date/Time   WBC 4.5 04/29/2018 1354   WBC 5.3 08/21/2016 1711   RBC 3.73 (L) 04/29/2018 1354   RBC 3.03 (L) 08/21/2016 1711   HGB 11.5 04/29/2018 1354   HCT 34.7 04/29/2018 1354   PLT 170 08/21/2016 1711   MCV 93 04/29/2018 1354   MCH 30.8 04/29/2018 1354   MCH 31.4 08/21/2016 1711   MCHC 33.1 04/29/2018 1354   MCHC 32.9  08/21/2016 1711  RDW 11.6 (L) 04/29/2018 1354   LYMPHSABS 1.9 04/29/2018 1354   EOSABS 0.0 04/29/2018 1354   BASOSABS 0.0 04/29/2018 1354   Iron/TIBC/Ferritin/ %Sat No results found for: IRON, TIBC, FERRITIN, IRONPCTSAT Lipid Panel     Component Value Date/Time   CHOL 156 04/29/2018 1354   TRIG 76 04/29/2018 1354   HDL 74 04/29/2018 1354   LDLCALC 67 04/29/2018 1354   Hepatic Function Panel     Component Value Date/Time   PROT 6.4 04/29/2018 1354   ALBUMIN 4.4 04/29/2018 1354   AST 29 04/29/2018 1354   ALT 27 04/29/2018 1354   ALKPHOS 111 04/29/2018 1354   BILITOT 0.4 04/29/2018 1354      Component Value Date/Time   TSH 0.567 04/29/2018 1354      I, Burt Knack, am acting as transcriptionist for Quillian Quince, MD I have reviewed the above documentation for accuracy and completeness, and I agree with the above. -Quillian Quince, MD

## 2018-06-15 ENCOUNTER — Encounter (INDEPENDENT_AMBULATORY_CARE_PROVIDER_SITE_OTHER): Payer: Self-pay | Admitting: Family Medicine

## 2018-06-15 NOTE — Telephone Encounter (Signed)
Pt last appt was on 06/07/18 her next appt is on 06/22/18. Would you like me to get her in sooner?

## 2018-06-16 NOTE — Telephone Encounter (Signed)
Yes, please schedule her for a visit to discuss her concerns

## 2018-06-16 NOTE — Telephone Encounter (Signed)
Can you please call pt and get her an appt asap.

## 2018-06-17 ENCOUNTER — Encounter (INDEPENDENT_AMBULATORY_CARE_PROVIDER_SITE_OTHER): Payer: Self-pay | Admitting: Family Medicine

## 2018-06-17 ENCOUNTER — Other Ambulatory Visit: Payer: Self-pay

## 2018-06-17 ENCOUNTER — Encounter (INDEPENDENT_AMBULATORY_CARE_PROVIDER_SITE_OTHER): Payer: Self-pay

## 2018-06-17 ENCOUNTER — Ambulatory Visit (INDEPENDENT_AMBULATORY_CARE_PROVIDER_SITE_OTHER): Payer: PRIVATE HEALTH INSURANCE | Admitting: Family Medicine

## 2018-06-17 DIAGNOSIS — Z6834 Body mass index (BMI) 34.0-34.9, adult: Secondary | ICD-10-CM

## 2018-06-17 DIAGNOSIS — E669 Obesity, unspecified: Secondary | ICD-10-CM | POA: Diagnosis not present

## 2018-06-17 DIAGNOSIS — E109 Type 1 diabetes mellitus without complications: Secondary | ICD-10-CM | POA: Diagnosis not present

## 2018-06-18 ENCOUNTER — Encounter (INDEPENDENT_AMBULATORY_CARE_PROVIDER_SITE_OTHER): Payer: Self-pay | Admitting: Family Medicine

## 2018-06-20 NOTE — Telephone Encounter (Signed)
Please advise 

## 2018-06-21 NOTE — Progress Notes (Signed)
Office: [phone]  /  Fax: [phone] TeleHealth Visit:  Colleen Zuniga has verbally consented to this TeleHealth visit today. The patient is located at home, the provider is located at the [facility] office. The participants in this visit include the listed provider and patient. Colleen Zuniga was unable to use realtime audiovisual technology today and the telehealth visit was conducted via telephone.   HPI:   Chief Complaint: OBESITY Colleen Zuniga is here to discuss her progress with her obesity treatment plan. She is on the Category 2 plan and is following her eating plan approximately 100 % of the time. She states she is keeping active. Face time went down so the visit was changed to phone. Colleen Zuniga states she is following our 1500 calorie, protein and vegetable rich diet exactally, but is not losing weight. Her RMR was 2300 calories which doesn't make sense. She notes she is no longer sick and is on the last day of using her inhaler. We were unable to weigh the patient today for this TeleHealth visit. She feels as if she has maintained her weight since her last visit. She has lost 3 lbs since starting treatment with [place].  Diabetes I Colleen Zuniga has a diagnosis of diabetes type I. Colleen Zuniga is now on an improved CGM and is feeling well. She states her average glucose is 127 now, improved with diet. She denies hypoglycemia. Last A1c was 6.2. She has been working on intensive lifestyle modifications including diet, exercise, and weight loss to help control her blood glucose levels.  ASSESSMENT AND PLAN:  Type 1 diabetes mellitus without complication (HCC)  Class 1 obesity with serious comorbidity and body mass index (BMI) of 34.0 to 34.9 in adult, unspecified obesity type  PLAN:  Diabetes I Colleen Zuniga has been given extensive diabetes education by myself today including ideal fasting and post-prandial blood glucose readings, individual ideal Hgb A1c goals and hypoglycemia prevention. We discussed  the importance of good blood sugar control to decrease the likelihood of diabetic complications such as nephropathy, neuropathy, limb loss, blindness, coronary artery disease, and death. We discussed the importance of intensive lifestyle modification including diet, exercise and weight loss as the first line treatment for diabetes. Colleen Zuniga agrees to continue her diabetes medications and diet, and will add exercise soon. Colleen Zuniga agrees to follow up with our clinic in 3 weeks.  I spent > than 50% of the 25 minute visit on counseling as documented in the note.  Obesity Colleen Zuniga states she is currently in the action stage of change. As such, her goal is to continue with weight loss efforts She has agreed to change to the 1200 kcal Category 2 plan Colleen Zuniga has been instructed to work up to a goal of 150 minutes of combined cardio and strengthening exercise per week or start walking 10-15 minutes per day for weight loss and overall health benefits. We discussed the following Behavioral Modification Strategies today: no skipping meals Colleen Zuniga was informed that her RMR may have been off due to increased caffeine or activity, and we will recheck as soon as she can be back in the office.  Colleen Zuniga has agreed to follow up with our clinic in 3 weeks. She was informed of the importance of frequent follow up visits to maximize her success with intensive lifestyle modifications for her multiple health conditions.  ALLERGIES: Allergies  Allergen Reactions  . Penicillins Anaphylaxis and Other (See Comments)    CLOSES THROAT PATIENT HAD A PCN REACTION WITH IMMEDIATE RASH, FACIAL/TONGUE/THROAT SWELLING, SOB,  OR LIGHTHEADEDNESS WITH HYPOTENSION:  #  #  #  YES  #  #  #   Has patient had a PCN reaction causing severe rash involving mucus membranes or skin necrosis: UNKNOWN  Has patient had a PCN reaction that required hospitalization:No Has patient had a PCN reaction occurring within the last 10 years:No   .  Hydrocodone-Acetaminophen     Nausea/vomiting with or without food  . Adhesive [Tape] Rash    Specifically the pink tape caused redness and itching     MEDICATIONS: Current Outpatient Medications on File Prior to Visit  Medication Sig Dispense Refill  . acetaminophen (TYLENOL) 500 MG tablet Take 1,000 mg by mouth every 6 (six) hours as needed for mild pain.     Marland Kitchen ALPRAZolam (XANAX) 0.5 MG tablet Take 0.25-0.5 mg by mouth at bedtime as needed for sleep or anxiety.  3  . Ascorbic Acid (VITAMIN C WITH ROSE HIPS) 1000 MG tablet Take 1,000 mg by mouth daily after breakfast.    . atorvastatin (LIPITOR) 10 MG tablet Take 10 mg by mouth daily after supper.    Marland Kitchen buPROPion (WELLBUTRIN XL) 300 MG 24 hr tablet Take 300 mg by mouth daily before breakfast.    . Continuous Glucose Monitor DEVI 1 Device by Other route continuous.    Marland Kitchen desloratadine (CLARINEX) 5 MG tablet Take 5 mg by mouth daily before breakfast.    . etodolac (LODINE) 500 MG tablet Take 500 mg by mouth daily after breakfast.    . gabapentin (NEURONTIN) 100 MG capsule Take 100-300 mg by mouth 3 (three) times daily. Take 1 capsule (100 mg) by mouth first thing in the morning, take 1 capsule (100 mg) by mouth at lunch, & take 3 capsules (300 mg) at bedtime    . Insulin Human (INSULIN PUMP) SOLN Inject into the skin continuous. Novolog    . irbesartan (AVAPRO) 300 MG tablet Take 300 mg by mouth daily after breakfast.    . levothyroxine (SYNTHROID, LEVOTHROID) 137 MCG tablet Take 137 mcg by mouth daily before breakfast.    . Magnesium 500 MG TABS Take 500 mg by mouth daily after breakfast.    . metoCLOPramide (REGLAN) 10 MG tablet Take 10 mg by mouth daily as needed for nausea.    . Multiple Vitamin (MULTIVITAMIN WITH MINERALS) TABS tablet Take 1 tablet by mouth daily after breakfast.    . potassium gluconate 595 (99 K) MG TABS tablet Take 595 mg by mouth daily after breakfast.     No current facility-administered medications on file prior to  visit.     PAST MEDICAL HISTORY: Past Medical History:  Diagnosis Date  . Anxiety   . Arthritis   . Back pain   . De Quervain's tenosynovitis, right 01/2012  . Degenerative disc disease, cervical    states neck is stiff, reduced range of motion  . Dental crowns present   . Depression   . Diabetes mellitus    Insulin pump  . GERD (gastroesophageal reflux disease)   . Heart murmur    states has a functional murmur, and that she has never had any problems  . Hypothyroidism   . Joint pain   . PONV (postoperative nausea and vomiting)     PAST SURGICAL HISTORY: Past Surgical History:  Procedure Laterality Date  . ANTERIOR CERVICAL DISCECTOMY  02/20/2016  . ANTERIOR LAT LUMBAR FUSION N/A 08/18/2016   Procedure: ANTERIOR LATERAL LUMBAR FUSION LUMBAR TWO- LUMBAR THREE, LUMBAR THREE- LUMBAR FOUR, LUMBAR FOUR-  LUMBAR FIVE; LUMBAR TWO- LUMBAR FIVE PEDICLE SCREW FIXATION;  Surgeon: Shirlean KellyNudelman, Robert, MD;  [facility];  Service: Neurosurgery;  Laterality: N/A;  ANTERIOR LATERAL LUMBAR FUSION LUMBAR 2- LUMBAR 3, LUMBAR 3- LUMBAR 4-, LUMBAR 4- LUMBAR 5; LUMBAR 2- LUMBAR 5 PEDICLE SCREW FIXATION  . CESAREAN SECTION    . DORSAL COMPARTMENT RELEASE  02/08/2002   first dorsal compartment left wrist  . DORSAL COMPARTMENT RELEASE  02/03/2012   Procedure: RELEASE DORSAL COMPARTMENT (DEQUERVAIN);  Surgeon: Nicki ReaperGary R Kuzma, MD;  Location: [facility];  Service: Orthopedics;  Laterality: Right;  RELEASE DEQUERVAINS RIGHT WRIST  . KNEE ARTHROSCOPY  10/14/2004   right  . SHOULDER ARTHROSCOPY  05/05/2005   left  . TRIGGER FINGER RELEASE  01/07/2011   release A1 pulley left thumb  . TRIGGER FINGER RELEASE     x 5 other fingers    SOCIAL HISTORY: Social History   Tobacco Use  . Smoking status: Former Smoker    Last attempt to quit: 03/11/1991    Years since quitting: 27.2  . Smokeless tobacco: Never Used  Substance Use Topics  . Alcohol use: Yes    Comment: rarely  . Drug use: No     FAMILY HISTORY: Family History  Problem Relation Age of Onset  . Stroke Father   . Diabetes Father   . Heart disease Father   . Kidney disease Father   . Obesity Father     ROS: Review of Systems  Constitutional: Negative for weight loss.  Endo/Heme/Allergies:       Negative hypoglycemia    PHYSICAL EXAM: Pt in no acute distress  RECENT LABS AND TESTS: BMET    Component Value Date/Time   NA 139 04/29/2018 1354   K 5.1 04/29/2018 1354   CL 102 04/29/2018 1354   CO2 23 04/29/2018 1354   GLUCOSE 94 04/29/2018 1354   GLUCOSE 119 (H) 08/21/2016 1711   BUN 18 04/29/2018 1354   CREATININE 0.74 04/29/2018 1354   CALCIUM 9.5 04/29/2018 1354   GFRNONAA 89 04/29/2018 1354   GFRAA 103 04/29/2018 1354   Lab Results  Component Value Date   HGBA1C 6.2 (H) 04/29/2018   HGBA1C 6.1 (H) 08/11/2016   No results found for: INSULIN CBC    Component Value Date/Time   WBC 4.5 04/29/2018 1354   WBC 5.3 08/21/2016 1711   RBC 3.73 (L) 04/29/2018 1354   RBC 3.03 (L) 08/21/2016 1711   HGB 11.5 04/29/2018 1354   HCT 34.7 04/29/2018 1354   PLT 170 08/21/2016 1711   MCV 93 04/29/2018 1354   MCH 30.8 04/29/2018 1354   MCH 31.4 08/21/2016 1711   MCHC 33.1 04/29/2018 1354   MCHC 32.9 08/21/2016 1711   RDW 11.6 (L) 04/29/2018 1354   LYMPHSABS 1.9 04/29/2018 1354   EOSABS 0.0 04/29/2018 1354   BASOSABS 0.0 04/29/2018 1354   Iron/TIBC/Ferritin/ %Sat No results found for: IRON, TIBC, FERRITIN, IRONPCTSAT Lipid Panel     Component Value Date/Time   CHOL 156 04/29/2018 1354   TRIG 76 04/29/2018 1354   HDL 74 04/29/2018 1354   LDLCALC 67 04/29/2018 1354   Hepatic Function Panel     Component Value Date/Time   PROT 6.4 04/29/2018 1354   ALBUMIN 4.4 04/29/2018 1354   AST 29 04/29/2018 1354   ALT 27 04/29/2018 1354   ALKPHOS 111 04/29/2018 1354   BILITOT 0.4 04/29/2018 1354      Component Value Date/Time   TSH 0.567 04/29/2018 1354  I, Burt Knack, am acting as  transcriptionist for Quillian Quince, MD I have reviewed the above documentation for accuracy and completeness, and I agree with the above. -Quillian Quince, MD

## 2018-06-22 ENCOUNTER — Ambulatory Visit (INDEPENDENT_AMBULATORY_CARE_PROVIDER_SITE_OTHER): Payer: PRIVATE HEALTH INSURANCE | Admitting: Family Medicine

## 2018-07-01 ENCOUNTER — Encounter (INDEPENDENT_AMBULATORY_CARE_PROVIDER_SITE_OTHER): Payer: Self-pay | Admitting: Family Medicine

## 2018-07-02 ENCOUNTER — Encounter (INDEPENDENT_AMBULATORY_CARE_PROVIDER_SITE_OTHER): Payer: Self-pay | Admitting: Family Medicine

## 2018-07-05 NOTE — Telephone Encounter (Signed)
Good morning. I haven't cancelled this appt yet.  Thought maybe you or Dr. Dalbert Garnet might want to speak with her first.  Let me know what you think.

## 2018-07-05 NOTE — Telephone Encounter (Signed)
Please review. Pt sent a separate mychart cancelling appt for 4/29.

## 2018-07-07 ENCOUNTER — Ambulatory Visit (INDEPENDENT_AMBULATORY_CARE_PROVIDER_SITE_OTHER): Payer: Self-pay | Admitting: Family Medicine

## 2018-07-09 HISTORY — PX: BACK SURGERY: SHX140

## 2018-07-13 ENCOUNTER — Ambulatory Visit (INDEPENDENT_AMBULATORY_CARE_PROVIDER_SITE_OTHER): Payer: Self-pay | Admitting: Family Medicine

## 2018-07-14 ENCOUNTER — Other Ambulatory Visit: Payer: Self-pay | Admitting: Neurosurgery

## 2018-07-14 DIAGNOSIS — Z981 Arthrodesis status: Secondary | ICD-10-CM

## 2018-07-19 ENCOUNTER — Other Ambulatory Visit: Payer: Self-pay | Admitting: Neurosurgery

## 2018-07-19 DIAGNOSIS — Z981 Arthrodesis status: Secondary | ICD-10-CM

## 2018-07-21 ENCOUNTER — Other Ambulatory Visit: Payer: Self-pay

## 2018-07-21 ENCOUNTER — Other Ambulatory Visit: Payer: Self-pay | Admitting: Neurosurgery

## 2018-07-21 ENCOUNTER — Ambulatory Visit
Admission: RE | Admit: 2018-07-21 | Discharge: 2018-07-21 | Disposition: A | Discharge status: Home / Self Care | Payer: PRIVATE HEALTH INSURANCE | Source: Ambulatory Visit | Attending: Neurosurgery | Admitting: Neurosurgery

## 2018-07-21 DIAGNOSIS — Z981 Arthrodesis status: Secondary | ICD-10-CM

## 2018-07-27 ENCOUNTER — Other Ambulatory Visit: Payer: Self-pay | Admitting: Neurosurgery

## 2018-07-27 NOTE — Pre-Procedure Instructions (Signed)
Marthena Haner [facility]  07/27/2018      [id number] [name], [place] - [name] [place] [address] Phone: [phone] Fax: [phone]  [facility] [place], [place] - [address] AT Portal to Registered [facility] Sites [address] [place] [address] Phone: [phone] Fax: [phone]    Your procedure is scheduled on 08/03/18.  Report to [facility] Admitting at 530 A.M.  Call this number if you have problems the morning of surgery:  [phone]   Remember:  D    Take these medicines the morning of surgery with A SIP OF WATER ---tylenol,xanax,wellbutrin,flexeril,clarinex,neurontin,synthroid    Do not wear jewelry, make-up or nail polish.  Do not wear lotions, powders, or perfumes, or deodorant.  Do not shave 48 hours prior to surgery.  Men may shave face and neck.  Do not bring valuables to the hospital.  [facility] is not responsible for any belongings or valuables.  Contacts, dentures or bridgework may not be worn into surgery.  Leave your suitcase in the car.  After surgery it may be brought to your room.  For patients admitted to the hospital, discharge time will be determined by your treatment team.  Patients discharged the day of surgery will not be allowed to drive home.   NDo not take any aspirin,anti-inflammatories,vitamins,or herbal supplements 5-7 days prior to surgery. Special instructions:    Please read over the following fact sheets that you were given. MRSA Information [place] - Preparing for Surgery  Before surgery, you can play an important role.  Because skin is not sterile, your skin needs to be as free of germs as possible.  You can reduce the number of germs on you skin by washing with CHG (chlorahexidine gluconate) soap before surgery.  CHG is an antiseptic cleaner which kills germs and bonds with the skin to continue killing germs even after washing.  Oral Hygiene is  also important in reducing the risk of infection.  Remember to brush your teeth with your regular toothpaste the morning of surgery.  Please DO NOT use if you have an allergy to CHG or antibacterial soaps.  If your skin becomes reddened/irritated stop using the CHG and inform your nurse when you arrive at Short Stay.  Do not shave (including legs and underarms) for at least 48 hours prior to the first CHG shower.  You may shave your face.  Please follow these instructions carefully:   1.  Shower with CHG Soap the night before surgery and the morning of Surgery.  2.  If you choose to wash your hair, wash your hair first as usual with your normal shampoo.  3.  After you shampoo, rinse your hair and body thoroughly to remove the shampoo. 4.  Use CHG as you would any other liquid soap.  You can apply chg directly to the skin and wash gently with a      scrungie or washcloth.           5.  Apply the CHG Soap to your body ONLY FROM THE NECK DOWN.   Do not use on open wounds or open sores. Avoid contact with your eyes, ears, mouth and genitals (private parts).  Wash genitals (private parts) with your normal soap.  6.  Wash thoroughly, paying special attention to the area where your surgery will be performed.  7.  Thoroughly rinse your body with warm water from the neck down.  8.  DO NOT shower/wash with your normal soap after using and rinsing off the CHG Soap.  9.  Pat yourself dry with a clean towel.            10.  Wear clean pajamas.            11.  Place clean sheets on your bed the night of your first shower and do not sleep with pets.  Day of Surgery  Do not apply any lotions/deoderants the morning of surgery.   Please wear clean clothes to the hospital/surgery center. Remember to brush your teeth with toothpaste.     How to Manage Your Diabetes Before and After Surgery  Why is it important to control my blood sugar before and after surgery? . Improving blood sugar levels before and  after surgery helps healing and can limit problems. . A way of improving blood sugar control is eating a healthy diet by: o  Eating less sugar and carbohydrates o  Increasing activity/exercise o  Talking with your doctor about reaching your blood sugar goals . High blood sugars (greater than 180 mg/dL) can raise your risk of infections and slow your recovery, so you will need to focus on controlling your diabetes during the weeks before surgery. . Make sure that the doctor who takes care of your diabetes knows about your planned surgery including the date and location.  How do I manage my blood sugar before surgery? . Check your blood sugar at least 4 times a day, starting 2 days before surgery, to make sure that the level is not too high or low. o Check your blood sugar the morning of your surgery when you wake up and every 2 hours until you get to the Short Stay unit. . If your blood sugar is less than 70 mg/dL, you will need to treat for low blood sugar: o Do not take insulin. o Treat a low blood sugar (less than 70 mg/dL) with  cup of clear juice (cranberry or apple), 4 glucose tablets, OR glucose gel. Recheck blood sugar in 15 minutes after treatment (to make sure it is greater than 70 mg/dL). If your blood sugar is not greater than 70 mg/dL on recheck, call  o  for further instructions. . Report your blood sugar to the short stay nurse when you get to Short Stay.  . If you are admitted to the hospital after surgery: o Your blood sugar will be checked by the staff and you will probably be given insulin after surgery (instead of oral diabetes medicines) to make sure you have good blood sugar levels. o The goal for blood sugar control after surgery is 80-180 mg/dL.  REDUCE ALL BASEL RATES BY 20% at [place]   OR DISCUSS WITH YOUR DOCTOR            WHAT DO I DO ABOUT MY DIABETES MEDICATION?   Marland Kitchen. Do not take oral diabetes medicines (pills) the morning of surgery. . The day of surgery,  do not take other diabetes injectables, including Byetta (exenatide), Bydureon (exenatide ER), Victoza (liraglutide), or Trulicity (dulaglutide).  . If your CBG is greater than 220 mg/dL, you may take  of your sliding scale (correction) dose of insulin.  Other Instructions:          Patient Signature:  Date:   Nurse Signature:  Date:   Reviewed and Endorsed by [facility] Patient Education Committee, August 2015

## 2018-07-28 ENCOUNTER — Encounter (HOSPITAL_COMMUNITY): Payer: Self-pay

## 2018-07-28 ENCOUNTER — Encounter (HOSPITAL_COMMUNITY)
Admission: RE | Admit: 2018-07-28 | Discharge: 2018-07-28 | Disposition: A | Discharge status: Home / Self Care | Payer: PRIVATE HEALTH INSURANCE | Source: Ambulatory Visit | Attending: Neurosurgery | Admitting: Neurosurgery

## 2018-07-28 ENCOUNTER — Other Ambulatory Visit: Payer: Self-pay

## 2018-07-28 DIAGNOSIS — Z1159 Encounter for screening for other viral diseases: Secondary | ICD-10-CM | POA: Diagnosis not present

## 2018-07-28 DIAGNOSIS — Z01812 Encounter for preprocedural laboratory examination: Secondary | ICD-10-CM | POA: Diagnosis not present

## 2018-07-28 LAB — CBC
HCT: 37.8 % (ref 36.0–46.0)
Hemoglobin: 12.3 g/dL (ref 12.0–15.0)
MCH: 31.6 pg (ref 26.0–34.0)
MCHC: 32.5 g/dL (ref 30.0–36.0)
MCV: 97.2 fL (ref 80.0–100.0)
Platelets: 251 10*3/uL (ref 150–400)
RBC: 3.89 MIL/uL (ref 3.87–5.11)
RDW: 12 % (ref 11.5–15.5)
WBC: 6.3 10*3/uL (ref 4.0–10.5)
nRBC: 0 % (ref 0.0–0.2)

## 2018-07-28 LAB — BASIC METABOLIC PANEL WITH GFR
Anion gap: 9 (ref 5–15)
BUN: 21 mg/dL — ABNORMAL HIGH (ref 6–20)
CO2: 24 mmol/L (ref 22–32)
Calcium: 10.1 mg/dL (ref 8.9–10.3)
Chloride: 105 mmol/L (ref 98–111)
Creatinine, Ser: 0.96 mg/dL (ref 0.44–1.00)
GFR calc Af Amer: >60 mL/min
GFR calc non Af Amer: >60 mL/min
Glucose, Bld: 135 mg/dL — ABNORMAL HIGH (ref 70–99)
Potassium: 5 mmol/L (ref 3.5–5.1)
Sodium: 138 mmol/L (ref 135–145)

## 2018-07-28 LAB — GLUCOSE, CAPILLARY: Glucose-Capillary: 159 mg/dL — ABNORMAL HIGH (ref 70–99)

## 2018-07-28 LAB — HEMOGLOBIN A1C
Hgb A1c MFr Bld: 6.4 % — ABNORMAL HIGH (ref 4.8–5.6)
Mean Plasma Glucose: 136.98 mg/dL

## 2018-07-28 LAB — TYPE AND SCREEN
ABO/RH(D): B POS
Antibody Screen: NEGATIVE

## 2018-07-28 NOTE — Progress Notes (Signed)
PCP - Ravisankar Avva Cardiologist - denies  Chest x-ray - done @ PCP in March 2020 EKG - 04/2018  DM - Type 1 Fasting Blood Sugar - 100-120s Pt on insulin pump - still checks sugars 6x/day Pt instructed to reduce pump settings by 20%. Follow up with anesthesia DOS for further instructions on keeping insulin pump on.  Pt stated she stopped taking lodine for surgery.  Anesthesia review: N/A  Patient denies shortness of breath, fever, cough and chest pain at PAT appointment   Patient verbalized understanding of instructions that were given to them at the PAT appointment. Patient was also instructed that they will need to review over the PAT instructions again at home before surgery.

## 2018-07-28 NOTE — Progress Notes (Signed)
PCR - test ruled incomplete after PAT appointment  Need to reswab DOS & Betadine

## 2018-07-28 NOTE — Pre-Procedure Instructions (Signed)
Colleen Zuniga [facility]  07/28/2018      [id number] [place],  - [address] [place] [place] [address] Phone: [phone] Fax: [phone]  [facility] - [place], [place] - [address] Portal to Registered [facility] Sites [address] [place] [address] Phone: [phone] Fax: [phone]    Your procedure is scheduled on 08/03/18.  Report to [facility] Entrance "A" at 5:30 A.M.  Call this number if you have problems the morning of surgery:  [phone]   Remember:    Take these medicines the morning of surgery with A SIP OF WATER  Tylenol, Xanax, Wellbutrin, Flexeril, Clarinex, Neurontin, Synthroid  7 days prior to surgery STOP taking any Aspirin (unless otherwise instructed by your surgeon), Aleve, Naproxen, Ibuprofen, Motrin, Advil, Goody's, BC's, all herbal medications, fish oil, and all vitamins.   WHAT DO I DO ABOUT MY DIABETES MEDICATION?   Marland Kitchen Do not take oral diabetes medicines (pills) the morning of surgery.  . The day of surgery, do not take other diabetes injectables, including Byetta (exenatide), Bydureon (exenatide ER), Victoza (liraglutide), or Trulicity (dulaglutide).  . If your CBG is greater than 220 mg/dL, you may take  of your sliding scale (correction) dose of insulin.  Marland Kitchen REDUCE ALL BASEL RATES BY 20% at MIDNIGHT OR DISCUSS WITH YOUR DOCTOR   How to Manage Your Diabetes Before and After Surgery  Why is it important to control my blood sugar before and after surgery? . Improving blood sugar levels before and after surgery helps healing and can limit problems. . A way of improving blood sugar control is eating a healthy diet by: o  Eating less sugar and carbohydrates o  Increasing activity/exercise o  Talking with your doctor about reaching your blood sugar goals . High blood sugars (greater than 180 mg/dL) can raise your risk of infections and slow your recovery, so you will need to focus  on controlling your diabetes during the weeks before surgery. . Make sure that the doctor who takes care of your diabetes knows about your planned surgery including the date and location.  How do I manage my blood sugar before surgery? . Check your blood sugar at least 4 times a day, starting 2 days before surgery, to make sure that the level is not too high or low. o Check your blood sugar the morning of your surgery when you wake up and every 2 hours until you get to the Short Stay unit. . If your blood sugar is less than 70 mg/dL, you will need to treat for low blood sugar: o Do not take insulin. o Treat a low blood sugar (less than 70 mg/dL) with  cup of clear juice (cranberry or apple), 4 glucose tablets, OR glucose gel. o Recheck blood sugar in 15 minutes after treatment (to make sure it is greater than 70 mg/dL). If your blood sugar is not greater than 70 mg/dL on recheck, call [phone] for further instructions. . Report your blood sugar to the short stay nurse when you get to Short Stay.  . If you are admitted to the hospital after surgery: o Your blood sugar will be checked by the staff and you will probably be given insulin after surgery (instead of oral diabetes medicines) to make sure you have good blood sugar levels. o The goal for blood sugar control after surgery is 80-180 mg/dL.       Do not wear jewelry, make-up or  nail polish.  Do not wear lotions, powders, or perfumes, or deodorant.  Do not shave 48 hours prior to surgery.  Men may shave face and neck.  Do not bring valuables to the hospital.  [facility] is not responsible for any belongings or valuables.    [facility]- Preparing For Surgery  Before surgery, you can play an important role. Because skin is not sterile, your skin needs to be as free of germs as possible. You can reduce the number of germs on your skin by washing with CHG (chlorahexidine gluconate) Soap before surgery.  CHG is an antiseptic cleaner  which kills germs and bonds with the skin to continue killing germs even after washing.    Oral Hygiene is also important to reduce your risk of infection.  Remember - BRUSH YOUR TEETH THE MORNING OF SURGERY WITH YOUR REGULAR TOOTHPASTE  Please do not use if you have an allergy to CHG or antibacterial soaps. If your skin becomes reddened/irritated stop using the CHG.  Do not shave (including legs and underarms) for at least 48 hours prior to first CHG shower. It is OK to shave your face.  Please follow these instructions carefully.   1. Shower the NIGHT BEFORE SURGERY and the MORNING OF SURGERY with CHG.   2. If you chose to wash your hair, wash your hair first as usual with your normal shampoo.  3. After you shampoo, rinse your hair and body thoroughly to remove the shampoo.  4. Use CHG as you would any other liquid soap. You can apply CHG directly to the skin and wash gently with a scrungie or a clean washcloth.   5. Apply the CHG Soap to your body ONLY FROM THE NECK DOWN.  Do not use on open wounds or open sores. Avoid contact with your eyes, ears, mouth and genitals (private parts). Wash Face and genitals (private parts)  with your normal soap.  6. Wash thoroughly, paying special attention to the area where your surgery will be performed.  7. Thoroughly rinse your body with warm water from the neck down.  8. DO NOT shower/wash with your normal soap after using and rinsing off the CHG Soap.  9. Pat yourself dry with a CLEAN TOWEL.  10. Wear CLEAN PAJAMAS to bed the night before surgery, wear comfortable clothes the morning of surgery  11. Place CLEAN SHEETS on your bed the night of your first shower and DO NOT SLEEP WITH PETS.   Day of Surgery:  Do not apply any deodorants/lotions.  Please wear clean clothes to the hospital/surgery center.   Remember to brush your teeth WITH YOUR REGULAR TOOTHPASTE.   Contacts, dentures or bridgework may not be worn into surgery.  Leave your  suitcase in the car.  After surgery it may be brought to your room.  For patients admitted to the hospital, discharge time will be determined by your treatment team.  Patients discharged the day of surgery will not be allowed to drive home.

## 2018-07-30 ENCOUNTER — Other Ambulatory Visit (HOSPITAL_COMMUNITY)
Admission: RE | Admit: 2018-07-30 | Discharge: 2018-07-30 | Disposition: A | Discharge status: Home / Self Care | Payer: PRIVATE HEALTH INSURANCE | Source: Ambulatory Visit | Attending: Neurosurgery | Admitting: Neurosurgery

## 2018-07-30 DIAGNOSIS — Z01812 Encounter for preprocedural laboratory examination: Secondary | ICD-10-CM | POA: Diagnosis not present

## 2018-07-31 LAB — NOVEL CORONAVIRUS, NAA (HOSP ORDER, SEND-OUT TO REF LAB; TAT 18-24 HRS): SARS-CoV-2, NAA: NOT DETECTED

## 2018-08-02 NOTE — Anesthesia Preprocedure Evaluation (Addendum)
Anesthesia Evaluation  Patient identified by MRN, date of birth, ID band Patient awake    Reviewed: Allergy & Precautions, NPO status , Patient's Chart, lab work & pertinent test results  History of Anesthesia Complications (+) PONV and history of anesthetic complications  Airway Mallampati: II  TM Distance: >3 FB Neck ROM: Full    Dental  (+) Dental Advisory Given, Teeth Intact   Pulmonary former smoker,    breath sounds clear to auscultation       Cardiovascular Exercise Tolerance: Good negative cardio ROS   Rhythm:Regular Rate:Normal     Neuro/Psych PSYCHIATRIC DISORDERS Anxiety Depression negative neurological ROS     GI/Hepatic Neg liver ROS, GERD  Controlled,  Endo/Other  diabetes (Insulin pump), Insulin DependentHypothyroidism  Obesity Insulin pump set to 80% of normal basal rate   Renal/GU negative Renal ROS     Musculoskeletal  (+) Arthritis ,   Abdominal   Peds  Hematology negative hematology ROS (+)   Anesthesia Other Findings  Anaphylaxis to PCN   Reproductive/Obstetrics                           Anesthesia Physical Anesthesia Plan  ASA: III  Anesthesia Plan: General   Post-op Pain Management:    Induction: Intravenous  PONV Risk Score and Plan: 4 or greater and Ondansetron, Scopolamine patch - Pre-op, Midazolam, Treatment may vary due to age or medical condition and Propofol infusion  Airway Management Planned: Oral ETT  Additional Equipment: None  Intra-op Plan:   Post-operative Plan: Extubation in OR  Informed Consent: I have reviewed the patients History and Physical, chart, labs and discussed the procedure including the risks, benefits and alternatives for the proposed anesthesia with the patient or authorized representative who has indicated his/her understanding and acceptance.     Dental advisory given  Plan Discussed with: CRNA and  Anesthesiologist  Anesthesia Plan Comments:        Anesthesia Quick Evaluation

## 2018-08-03 ENCOUNTER — Inpatient Hospital Stay (HOSPITAL_COMMUNITY): Payer: PRIVATE HEALTH INSURANCE

## 2018-08-03 ENCOUNTER — Inpatient Hospital Stay (HOSPITAL_COMMUNITY): Payer: PRIVATE HEALTH INSURANCE | Admitting: Anesthesiology

## 2018-08-03 ENCOUNTER — Encounter (HOSPITAL_COMMUNITY)
Admission: RE | Disposition: A | Discharge status: Home / Self Care | Payer: Self-pay | Source: Home / Self Care | Attending: Neurosurgery

## 2018-08-03 ENCOUNTER — Inpatient Hospital Stay (HOSPITAL_COMMUNITY)
Admission: RE | Admit: 2018-08-03 | Discharge: 2018-08-04 | DRG: 454 | Disposition: A | Discharge status: Home / Self Care | Payer: PRIVATE HEALTH INSURANCE | Attending: Neurosurgery | Admitting: Neurosurgery

## 2018-08-03 ENCOUNTER — Inpatient Hospital Stay (HOSPITAL_COMMUNITY): Payer: PRIVATE HEALTH INSURANCE | Admitting: Physician Assistant

## 2018-08-03 ENCOUNTER — Other Ambulatory Visit: Payer: Self-pay

## 2018-08-03 ENCOUNTER — Encounter (HOSPITAL_COMMUNITY): Payer: Self-pay | Admitting: *Deleted

## 2018-08-03 DIAGNOSIS — M5106 Intervertebral disc disorders with myelopathy, lumbar region: Secondary | ICD-10-CM | POA: Diagnosis present

## 2018-08-03 DIAGNOSIS — M5126 Other intervertebral disc displacement, lumbar region: Secondary | ICD-10-CM | POA: Diagnosis present

## 2018-08-03 DIAGNOSIS — Z87891 Personal history of nicotine dependence: Secondary | ICD-10-CM

## 2018-08-03 DIAGNOSIS — Z87892 Personal history of anaphylaxis: Secondary | ICD-10-CM | POA: Diagnosis not present

## 2018-08-03 DIAGNOSIS — M5117 Intervertebral disc disorders with radiculopathy, lumbosacral region: Principal | ICD-10-CM | POA: Diagnosis present

## 2018-08-03 DIAGNOSIS — Z91048 Other nonmedicinal substance allergy status: Secondary | ICD-10-CM

## 2018-08-03 DIAGNOSIS — Z880 Allergy status to penicillin: Secondary | ICD-10-CM

## 2018-08-03 DIAGNOSIS — E119 Type 2 diabetes mellitus without complications: Secondary | ICD-10-CM | POA: Diagnosis present

## 2018-08-03 DIAGNOSIS — F329 Major depressive disorder, single episode, unspecified: Secondary | ICD-10-CM | POA: Diagnosis present

## 2018-08-03 DIAGNOSIS — Z885 Allergy status to narcotic agent status: Secondary | ICD-10-CM | POA: Diagnosis not present

## 2018-08-03 DIAGNOSIS — Z794 Long term (current) use of insulin: Secondary | ICD-10-CM

## 2018-08-03 DIAGNOSIS — M4726 Other spondylosis with radiculopathy, lumbar region: Secondary | ICD-10-CM | POA: Diagnosis present

## 2018-08-03 DIAGNOSIS — Z79890 Hormone replacement therapy (postmenopausal): Secondary | ICD-10-CM

## 2018-08-03 DIAGNOSIS — Z833 Family history of diabetes mellitus: Secondary | ICD-10-CM

## 2018-08-03 DIAGNOSIS — Z79899 Other long term (current) drug therapy: Secondary | ICD-10-CM

## 2018-08-03 DIAGNOSIS — E039 Hypothyroidism, unspecified: Secondary | ICD-10-CM | POA: Diagnosis present

## 2018-08-03 DIAGNOSIS — Z9641 Presence of insulin pump (external) (internal): Secondary | ICD-10-CM | POA: Diagnosis present

## 2018-08-03 DIAGNOSIS — M1990 Unspecified osteoarthritis, unspecified site: Secondary | ICD-10-CM | POA: Diagnosis present

## 2018-08-03 DIAGNOSIS — K219 Gastro-esophageal reflux disease without esophagitis: Secondary | ICD-10-CM | POA: Diagnosis present

## 2018-08-03 DIAGNOSIS — M960 Pseudarthrosis after fusion or arthrodesis: Secondary | ICD-10-CM | POA: Diagnosis present

## 2018-08-03 DIAGNOSIS — Z419 Encounter for procedure for purposes other than remedying health state, unspecified: Secondary | ICD-10-CM

## 2018-08-03 LAB — SURGICAL PCR SCREEN
MRSA, PCR: NEGATIVE
Staphylococcus aureus: NEGATIVE

## 2018-08-03 LAB — GLUCOSE, CAPILLARY
Glucose-Capillary: 140 mg/dL — ABNORMAL HIGH (ref 70–99)
Glucose-Capillary: 203 mg/dL — ABNORMAL HIGH (ref 70–99)
Glucose-Capillary: 194 mg/dL — ABNORMAL HIGH (ref 70–99)

## 2018-08-03 SURGERY — POSTERIOR LUMBAR FUSION 1 LEVEL
Anesthesia: General | Site: Back | Laterality: Bilateral

## 2018-08-03 MED ORDER — ACETAMINOPHEN 10 MG/ML IV SOLN
INTRAVENOUS | Status: DC | PRN
  Administered 2018-08-03: 1000 mg via INTRAVENOUS

## 2018-08-03 MED ORDER — BUPIVACAINE HCL (PF) 0.5 % IJ SOLN
INTRAMUSCULAR | Status: DC | PRN
  Administered 2018-08-03: 20 mL

## 2018-08-03 MED ORDER — ALPRAZOLAM 0.25 MG PO TABS
0.2500 mg | ORAL_TABLET | Freq: Every evening | ORAL | Status: DC | PRN
  Administered 2018-08-03: 22:00:00 0.5 mg via ORAL
  Filled 2018-08-03: qty 2

## 2018-08-03 MED ORDER — FLEET ENEMA 7-19 GM/118ML RE ENEM: 1.0000 | ENEMA | Freq: Once | RECTAL | Status: DC | PRN

## 2018-08-03 MED ORDER — MIDAZOLAM HCL 2 MG/2ML IJ SOLN
INTRAMUSCULAR | Status: AC
  Filled 2018-08-03: qty 2

## 2018-08-03 MED ORDER — CHLORHEXIDINE GLUCONATE CLOTH 2 % EX PADS: 6.0000 | MEDICATED_PAD | Freq: Once | CUTANEOUS | Status: DC

## 2018-08-03 MED ORDER — ONDANSETRON HCL 4 MG/2ML IJ SOLN
INTRAMUSCULAR | Status: DC | PRN
  Administered 2018-08-03: 4 mg via INTRAVENOUS

## 2018-08-03 MED ORDER — PHENOL 1.4 % MT LIQD: 1.0000 | OROMUCOSAL | Status: DC | PRN

## 2018-08-03 MED ORDER — KETOROLAC TROMETHAMINE 30 MG/ML IJ SOLN
30.0000 mg | Freq: Once | INTRAMUSCULAR | Status: AC
  Administered 2018-08-03: 30 mg via INTRAVENOUS

## 2018-08-03 MED ORDER — KETOROLAC TROMETHAMINE 30 MG/ML IJ SOLN
INTRAMUSCULAR | Status: AC
  Filled 2018-08-03: qty 1

## 2018-08-03 MED ORDER — 0.9 % SODIUM CHLORIDE (POUR BTL) OPTIME
TOPICAL | Status: DC | PRN
  Administered 2018-08-03: 1000 mL

## 2018-08-03 MED ORDER — ACETAMINOPHEN 10 MG/ML IV SOLN
INTRAVENOUS | Status: AC
  Filled 2018-08-03: qty 100

## 2018-08-03 MED ORDER — PROPOFOL 10 MG/ML IV BOLUS
INTRAVENOUS | Status: DC | PRN
  Administered 2018-08-03: 150 mg via INTRAVENOUS

## 2018-08-03 MED ORDER — BUPIVACAINE HCL (PF) 0.5 % IJ SOLN
INTRAMUSCULAR | Status: AC
  Filled 2018-08-03: qty 30

## 2018-08-03 MED ORDER — INSULIN PUMP
SUBCUTANEOUS | Status: DC
  Filled 2018-08-03: qty 1

## 2018-08-03 MED ORDER — THROMBIN 20000 UNITS EX SOLR
CUTANEOUS | Status: AC
  Filled 2018-08-03: qty 20000

## 2018-08-03 MED ORDER — DEXAMETHASONE SODIUM PHOSPHATE 10 MG/ML IJ SOLN
INTRAMUSCULAR | Status: DC | PRN
  Administered 2018-08-03: 5 mg via INTRAVENOUS

## 2018-08-03 MED ORDER — PHENYLEPHRINE 40 MCG/ML (10ML) SYRINGE FOR IV PUSH (FOR BLOOD PRESSURE SUPPORT)
PREFILLED_SYRINGE | INTRAVENOUS | Status: AC
  Filled 2018-08-03: qty 10

## 2018-08-03 MED ORDER — HYDROXYZINE HCL 50 MG/ML IM SOLN
50.0000 mg | INTRAMUSCULAR | Status: DC | PRN
  Filled 2018-08-03: qty 1

## 2018-08-03 MED ORDER — SUCCINYLCHOLINE CHLORIDE 200 MG/10ML IV SOSY
PREFILLED_SYRINGE | INTRAVENOUS | Status: AC
  Filled 2018-08-03: qty 10

## 2018-08-03 MED ORDER — MAGNESIUM HYDROXIDE 400 MG/5ML PO SUSP: 30.0000 mL | Freq: Every day | ORAL | Status: DC | PRN

## 2018-08-03 MED ORDER — ONDANSETRON HCL 4 MG/2ML IJ SOLN
INTRAMUSCULAR | Status: AC
  Filled 2018-08-03: qty 2

## 2018-08-03 MED ORDER — SODIUM CHLORIDE 0.9 % IV SOLN: 0.5000 [IU]/h | INTRAVENOUS | Status: DC

## 2018-08-03 MED ORDER — LACTATED RINGERS IV SOLN
INTRAVENOUS | Status: DC
  Administered 2018-08-03: 06:00:00 1000 mL via INTRAVENOUS
  Administered 2018-08-03: 12:00:00 via INTRAVENOUS

## 2018-08-03 MED ORDER — FENTANYL CITRATE (PF) 250 MCG/5ML IJ SOLN
INTRAMUSCULAR | Status: AC
  Filled 2018-08-03: qty 5

## 2018-08-03 MED ORDER — VANCOMYCIN HCL IN DEXTROSE 1-5 GM/200ML-% IV SOLN
INTRAVENOUS | Status: AC
  Administered 2018-08-03: 06:00:00 1000 mg via INTRAVENOUS
  Filled 2018-08-03: qty 200

## 2018-08-03 MED ORDER — SODIUM CHLORIDE 0.9 % IV SOLN
INTRAVENOUS | Status: DC | PRN
  Administered 2018-08-03: 09:00:00

## 2018-08-03 MED ORDER — THROMBIN 20000 UNITS EX SOLR
CUTANEOUS | Status: DC | PRN
  Administered 2018-08-03: 09:00:00 via TOPICAL

## 2018-08-03 MED ORDER — LIDOCAINE-EPINEPHRINE 1 %-1:100000 IJ SOLN
INTRAMUSCULAR | Status: AC
  Filled 2018-08-03: qty 1

## 2018-08-03 MED ORDER — DEXAMETHASONE SODIUM PHOSPHATE 10 MG/ML IJ SOLN
INTRAMUSCULAR | Status: AC
  Filled 2018-08-03: qty 1

## 2018-08-03 MED ORDER — THROMBIN 5000 UNITS EX SOLR
CUTANEOUS | Status: AC
  Filled 2018-08-03: qty 5000

## 2018-08-03 MED ORDER — OXYCODONE HCL 5 MG PO TABS
5.0000 mg | ORAL_TABLET | ORAL | Status: DC | PRN
  Filled 2018-08-03: qty 2

## 2018-08-03 MED ORDER — IRBESARTAN 300 MG PO TABS
300.0000 mg | ORAL_TABLET | Freq: Every day | ORAL | Status: DC
  Administered 2018-08-04: 09:00:00 300 mg via ORAL
  Filled 2018-08-03: qty 1

## 2018-08-03 MED ORDER — LIDOCAINE 2% (20 MG/ML) 5 ML SYRINGE
INTRAMUSCULAR | Status: AC
  Filled 2018-08-03: qty 5

## 2018-08-03 MED ORDER — ROCURONIUM BROMIDE 10 MG/ML (PF) SYRINGE
PREFILLED_SYRINGE | INTRAVENOUS | Status: AC
  Filled 2018-08-03: qty 10

## 2018-08-03 MED ORDER — ACETAMINOPHEN 650 MG RE SUPP: 650.0000 mg | RECTAL | Status: DC | PRN

## 2018-08-03 MED ORDER — LIDOCAINE 2% (20 MG/ML) 5 ML SYRINGE
INTRAMUSCULAR | Status: DC | PRN
  Administered 2018-08-03: 60 mg via INTRAVENOUS

## 2018-08-03 MED ORDER — SODIUM CHLORIDE 0.9 % IV SOLN: 250.0000 mL | INTRAVENOUS | Status: DC

## 2018-08-03 MED ORDER — KETOROLAC TROMETHAMINE 30 MG/ML IJ SOLN
30.0000 mg | Freq: Four times a day (QID) | INTRAMUSCULAR | Status: DC
  Administered 2018-08-03 – 2018-08-04 (×3): 30 mg via INTRAVENOUS
  Filled 2018-08-03 (×3): qty 1

## 2018-08-03 MED ORDER — SODIUM CHLORIDE 0.9 % IV SOLN: INTRAVENOUS | Status: DC

## 2018-08-03 MED ORDER — INSULIN PUMP
Freq: Three times a day (TID) | SUBCUTANEOUS | Status: DC
  Administered 2018-08-03: 17:00:00 via SUBCUTANEOUS
  Filled 2018-08-03: qty 1

## 2018-08-03 MED ORDER — SODIUM CHLORIDE 0.9% FLUSH
3.0000 mL | Freq: Two times a day (BID) | INTRAVENOUS | Status: DC
  Administered 2018-08-04: 3 mL via INTRAVENOUS

## 2018-08-03 MED ORDER — SODIUM CHLORIDE 0.9 % IV SOLN
INTRAVENOUS | Status: DC | PRN
  Administered 2018-08-03: 35 ug/min via INTRAVENOUS

## 2018-08-03 MED ORDER — POTASSIUM GLUCONATE 595 (99 K) MG PO TABS: 595.0000 mg | ORAL_TABLET | Freq: Every day | ORAL | Status: DC

## 2018-08-03 MED ORDER — EPHEDRINE SULFATE-NACL 50-0.9 MG/10ML-% IV SOSY
PREFILLED_SYRINGE | INTRAVENOUS | Status: DC | PRN
  Administered 2018-08-03: 5 mg via INTRAVENOUS
  Administered 2018-08-03: 10 mg via INTRAVENOUS
  Administered 2018-08-03: 15 mg via INTRAVENOUS

## 2018-08-03 MED ORDER — SCOPOLAMINE 1 MG/3DAYS TD PT72
MEDICATED_PATCH | TRANSDERMAL | Status: AC
  Filled 2018-08-03: qty 1

## 2018-08-03 MED ORDER — LEVOTHYROXINE SODIUM 137 MCG PO TABS
137.0000 ug | ORAL_TABLET | Freq: Every day | ORAL | Status: DC
  Filled 2018-08-03: qty 1

## 2018-08-03 MED ORDER — MIDAZOLAM HCL 5 MG/5ML IJ SOLN
INTRAMUSCULAR | Status: DC | PRN
  Administered 2018-08-03: 2 mg via INTRAVENOUS

## 2018-08-03 MED ORDER — BUPROPION HCL ER (XL) 300 MG PO TB24
300.0000 mg | ORAL_TABLET | Freq: Every day | ORAL | Status: DC
  Administered 2018-08-04: 09:00:00 300 mg via ORAL
  Filled 2018-08-03: qty 1

## 2018-08-03 MED ORDER — ONDANSETRON HCL 4 MG PO TABS: 4.0000 mg | ORAL_TABLET | Freq: Four times a day (QID) | ORAL | Status: DC | PRN

## 2018-08-03 MED ORDER — MAGNESIUM OXIDE 400 (241.3 MG) MG PO TABS
400.0000 mg | ORAL_TABLET | Freq: Every day | ORAL | Status: DC
  Administered 2018-08-04: 400 mg via ORAL
  Filled 2018-08-03: qty 1

## 2018-08-03 MED ORDER — LIDOCAINE-EPINEPHRINE 1 %-1:100000 IJ SOLN
INTRAMUSCULAR | Status: DC | PRN
  Administered 2018-08-03: 20 mL

## 2018-08-03 MED ORDER — SUCCINYLCHOLINE CHLORIDE 20 MG/ML IJ SOLN
INTRAMUSCULAR | Status: DC | PRN
  Administered 2018-08-03: 120 mg via INTRAVENOUS

## 2018-08-03 MED ORDER — EPHEDRINE 5 MG/ML INJ
INTRAVENOUS | Status: AC
  Filled 2018-08-03: qty 10

## 2018-08-03 MED ORDER — SODIUM CHLORIDE 0.9% FLUSH: 3.0000 mL | INTRAVENOUS | Status: DC | PRN

## 2018-08-03 MED ORDER — CYCLOBENZAPRINE HCL 10 MG PO TABS
ORAL_TABLET | ORAL | Status: AC
  Filled 2018-08-03: qty 1

## 2018-08-03 MED ORDER — OXYCODONE-ACETAMINOPHEN 5-325 MG PO TABS
1.0000 | ORAL_TABLET | ORAL | Status: DC | PRN
  Administered 2018-08-03 – 2018-08-04 (×3): 1 via ORAL
  Filled 2018-08-03 (×4): qty 1

## 2018-08-03 MED ORDER — ACETAMINOPHEN 325 MG PO TABS
650.0000 mg | ORAL_TABLET | ORAL | Status: DC | PRN
  Administered 2018-08-04: 650 mg via ORAL
  Filled 2018-08-03: qty 2

## 2018-08-03 MED ORDER — MORPHINE SULFATE (PF) 4 MG/ML IV SOLN: 4.0000 mg | INTRAVENOUS | Status: DC | PRN

## 2018-08-03 MED ORDER — SUGAMMADEX SODIUM 200 MG/2ML IV SOLN
INTRAVENOUS | Status: DC | PRN
  Administered 2018-08-03 (×2): 100 mg via INTRAVENOUS

## 2018-08-03 MED ORDER — ADULT MULTIVITAMIN W/MINERALS CH
1.0000 | ORAL_TABLET | Freq: Every day | ORAL | Status: DC
  Administered 2018-08-04: 1 via ORAL
  Filled 2018-08-03: qty 1

## 2018-08-03 MED ORDER — ATORVASTATIN CALCIUM 10 MG PO TABS
10.0000 mg | ORAL_TABLET | Freq: Every day | ORAL | Status: DC
  Administered 2018-08-03: 17:00:00 10 mg via ORAL
  Filled 2018-08-03: qty 1

## 2018-08-03 MED ORDER — VANCOMYCIN HCL IN DEXTROSE 1-5 GM/200ML-% IV SOLN
1000.0000 mg | INTRAVENOUS | Status: AC
  Administered 2018-08-03: 1000 mg via INTRAVENOUS

## 2018-08-03 MED ORDER — OXYCODONE HCL 5 MG PO TABS
ORAL_TABLET | ORAL | Status: AC
  Filled 2018-08-03: qty 2

## 2018-08-03 MED ORDER — GENTAMICIN IN SALINE 1.6-0.9 MG/ML-% IV SOLN
80.0000 mg | INTRAVENOUS | Status: AC
  Administered 2018-08-03: 80 mg via INTRAVENOUS
  Filled 2018-08-03: qty 50

## 2018-08-03 MED ORDER — OXYCODONE HCL 5 MG PO TABS: 5.0000 mg | ORAL_TABLET | Freq: Once | ORAL | Status: DC | PRN

## 2018-08-03 MED ORDER — SCOPOLAMINE 1 MG/3DAYS TD PT72
1.0000 | MEDICATED_PATCH | TRANSDERMAL | Status: DC
  Administered 2018-08-03: 1.5 mg via TRANSDERMAL

## 2018-08-03 MED ORDER — HYDROXYZINE HCL 50 MG PO TABS
50.0000 mg | ORAL_TABLET | ORAL | Status: DC | PRN
  Filled 2018-08-03: qty 1

## 2018-08-03 MED ORDER — LORATADINE 10 MG PO TABS
10.0000 mg | ORAL_TABLET | Freq: Every day | ORAL | Status: DC
  Administered 2018-08-03: 22:00:00 10 mg via ORAL
  Filled 2018-08-03: qty 1

## 2018-08-03 MED ORDER — MENTHOL 3 MG MT LOZG
1.0000 | LOZENGE | OROMUCOSAL | Status: DC | PRN
  Administered 2018-08-03: 3 mg via ORAL
  Filled 2018-08-03 (×2): qty 9

## 2018-08-03 MED ORDER — GABAPENTIN 300 MG PO CAPS
300.0000 mg | ORAL_CAPSULE | Freq: Three times a day (TID) | ORAL | Status: DC
  Administered 2018-08-03 (×2): 300 mg via ORAL
  Filled 2018-08-03 (×2): qty 1

## 2018-08-03 MED ORDER — OXYCODONE HCL 5 MG/5ML PO SOLN: 5.0000 mg | Freq: Once | ORAL | Status: DC | PRN

## 2018-08-03 MED ORDER — ONDANSETRON HCL 4 MG/2ML IJ SOLN
4.0000 mg | Freq: Four times a day (QID) | INTRAMUSCULAR | Status: DC | PRN
  Administered 2018-08-04: 4 mg via INTRAVENOUS
  Filled 2018-08-03: qty 2

## 2018-08-03 MED ORDER — ROCURONIUM BROMIDE 10 MG/ML (PF) SYRINGE
PREFILLED_SYRINGE | INTRAVENOUS | Status: AC
  Filled 2018-08-03: qty 20

## 2018-08-03 MED ORDER — BISACODYL 10 MG RE SUPP: 10.0000 mg | Freq: Every day | RECTAL | Status: DC | PRN

## 2018-08-03 MED ORDER — PROMETHAZINE HCL 25 MG/ML IJ SOLN: 6.2500 mg | INTRAMUSCULAR | Status: DC | PRN

## 2018-08-03 MED ORDER — PROPOFOL 10 MG/ML IV BOLUS
INTRAVENOUS | Status: AC
  Filled 2018-08-03: qty 20

## 2018-08-03 MED ORDER — PROPOFOL 1000 MG/100ML IV EMUL
INTRAVENOUS | Status: AC
  Filled 2018-08-03: qty 100

## 2018-08-03 MED ORDER — FENTANYL CITRATE (PF) 100 MCG/2ML IJ SOLN: 25.0000 ug | INTRAMUSCULAR | Status: DC | PRN

## 2018-08-03 MED ORDER — FENTANYL CITRATE (PF) 100 MCG/2ML IJ SOLN
INTRAMUSCULAR | Status: DC | PRN
  Administered 2018-08-03: 100 ug via INTRAVENOUS

## 2018-08-03 MED ORDER — BACITRACIN-NEOMYCIN-POLYMYXIN OINTMENT TUBE
TOPICAL_OINTMENT | CUTANEOUS | Status: DC | PRN
  Filled 2018-08-03: qty 14

## 2018-08-03 MED ORDER — ALUM & MAG HYDROXIDE-SIMETH 200-200-20 MG/5ML PO SUSP: 30.0000 mL | Freq: Four times a day (QID) | ORAL | Status: DC | PRN

## 2018-08-03 MED ORDER — FENTANYL CITRATE (PF) 100 MCG/2ML IJ SOLN
INTRAMUSCULAR | Status: AC
  Filled 2018-08-03: qty 2

## 2018-08-03 MED ORDER — PHENYLEPHRINE 40 MCG/ML (10ML) SYRINGE FOR IV PUSH (FOR BLOOD PRESSURE SUPPORT)
PREFILLED_SYRINGE | INTRAVENOUS | Status: DC | PRN
  Administered 2018-08-03 (×3): 120 ug via INTRAVENOUS

## 2018-08-03 MED ORDER — THROMBIN 5000 UNITS EX SOLR
OROMUCOSAL | Status: DC | PRN
  Administered 2018-08-03: 09:00:00 via TOPICAL

## 2018-08-03 MED ORDER — INSULIN REGULAR(HUMAN) IN NACL 100-0.9 UT/100ML-% IV SOLN
INTRAVENOUS | Status: DC
  Administered 2018-08-03: 09:00:00 1 [IU]/h via INTRAVENOUS
  Filled 2018-08-03: qty 100

## 2018-08-03 MED ORDER — ROCURONIUM BROMIDE 50 MG/5ML IV SOSY
PREFILLED_SYRINGE | INTRAVENOUS | Status: DC | PRN
  Administered 2018-08-03: 20 mg via INTRAVENOUS
  Administered 2018-08-03: 50 mg via INTRAVENOUS
  Administered 2018-08-03: 30 mg via INTRAVENOUS
  Administered 2018-08-03: 20 mg via INTRAVENOUS

## 2018-08-03 MED ORDER — CYCLOBENZAPRINE HCL 5 MG PO TABS
5.0000 mg | ORAL_TABLET | Freq: Three times a day (TID) | ORAL | Status: DC | PRN
  Administered 2018-08-03: 10 mg via ORAL
  Filled 2018-08-03 (×2): qty 2

## 2018-08-03 MED ORDER — PROPOFOL 500 MG/50ML IV EMUL
INTRAVENOUS | Status: DC | PRN
  Administered 2018-08-03: 25 ug/kg/min via INTRAVENOUS

## 2018-08-03 SURGICAL SUPPLY — 73 items
ADH SKN CLS APL DERMABOND .7 (GAUZE/BANDAGES/DRESSINGS) ×2
APL SKNCLS STERI-STRIP NONHPOA (GAUZE/BANDAGES/DRESSINGS) ×1
BAG DECANTER FOR FLEXI CONT (MISCELLANEOUS) ×2 IMPLANT
BENZOIN TINCTURE PRP APPL 2/3 (GAUZE/BANDAGES/DRESSINGS) ×2 IMPLANT
BUR ACRON 5.0MM COATED (BURR) ×2 IMPLANT
BUR MATCHSTICK NEURO 3.0 LAGG (BURR) ×2 IMPLANT
CAGE COROENT TI 12X9X25 8D (Cage) ×1 IMPLANT
CANISTER SUCT 3000ML PPV (MISCELLANEOUS) ×2 IMPLANT
CARTRIDGE OIL MAESTRO DRILL (MISCELLANEOUS) ×1 IMPLANT
CONNECTOR RELINE 20MM OPEN OFF (Connector) ×2 IMPLANT
CONT SPEC 4OZ CLIKSEAL STRL BL (MISCELLANEOUS) ×2 IMPLANT
COVER BACK TABLE 60X90IN (DRAPES) ×2 IMPLANT
COVER WAND RF STERILE (DRAPES) ×1 IMPLANT
DERMABOND ADVANCED (GAUZE/BANDAGES/DRESSINGS) ×2
DERMABOND ADVANCED .7 DNX12 (GAUZE/BANDAGES/DRESSINGS) ×1 IMPLANT
DIFFUSER DRILL AIR PNEUMATIC (MISCELLANEOUS) ×2 IMPLANT
DRAPE C-ARM 42X72 X-RAY (DRAPES) ×4 IMPLANT
DRAPE C-ARMOR (DRAPES) ×1 IMPLANT
DRAPE HALF SHEET 40X57 (DRAPES) ×1 IMPLANT
DRAPE LAPAROTOMY 100X72X124 (DRAPES) ×2 IMPLANT
DRAPE POUCH INSTRU U-SHP 10X18 (DRAPES) ×2 IMPLANT
ELECT REM PT RETURN 9FT ADLT (ELECTROSURGICAL) ×2
ELECTRODE REM PT RTRN 9FT ADLT (ELECTROSURGICAL) ×1 IMPLANT
GAUZE 4X4 16PLY RFD (DISPOSABLE) IMPLANT
GAUZE SPONGE 4X4 12PLY STRL (GAUZE/BANDAGES/DRESSINGS) ×2 IMPLANT
GLOVE BIOGEL PI IND STRL 7.0 (GLOVE) IMPLANT
GLOVE BIOGEL PI IND STRL 7.5 (GLOVE) IMPLANT
GLOVE BIOGEL PI IND STRL 8 (GLOVE) ×2 IMPLANT
GLOVE BIOGEL PI INDICATOR 7.0 (GLOVE) ×3
GLOVE BIOGEL PI INDICATOR 7.5 (GLOVE) ×4
GLOVE BIOGEL PI INDICATOR 8 (GLOVE) ×2
GLOVE ECLIPSE 7.5 STRL STRAW (GLOVE) ×4 IMPLANT
GLOVE SS N UNI LF 6.5 STRL (GLOVE) ×3 IMPLANT
GLOVE SS N UNI LF 7.0 STRL (GLOVE) ×4 IMPLANT
GOWN STRL REUS W/ TWL LRG LVL3 (GOWN DISPOSABLE) IMPLANT
GOWN STRL REUS W/ TWL XL LVL3 (GOWN DISPOSABLE) ×2 IMPLANT
GOWN STRL REUS W/TWL 2XL LVL3 (GOWN DISPOSABLE) IMPLANT
GOWN STRL REUS W/TWL LRG LVL3 (GOWN DISPOSABLE) ×4
GOWN STRL REUS W/TWL XL LVL3 (GOWN DISPOSABLE) ×6
HEMOSTAT POWDER KIT SURGIFOAM (HEMOSTASIS) ×1 IMPLANT
KIT BASIN OR (CUSTOM PROCEDURE TRAY) ×2 IMPLANT
KIT INFUSE SMALL (Orthopedic Implant) ×1 IMPLANT
KIT TURNOVER KIT B (KITS) ×2 IMPLANT
NDL ASP BONE MRW 8GX15 (NEEDLE) IMPLANT
NDL SPNL 18GX3.5 QUINCKE PK (NEEDLE) ×1 IMPLANT
NDL SPNL 22GX3.5 QUINCKE BK (NEEDLE) ×1 IMPLANT
NEEDLE ASP BONE MRW 8GX15 (NEEDLE) ×2 IMPLANT
NEEDLE SPNL 18GX3.5 QUINCKE PK (NEEDLE) ×4 IMPLANT
NEEDLE SPNL 22GX3.5 QUINCKE BK (NEEDLE) ×6 IMPLANT
NS IRRIG 1000ML POUR BTL (IV SOLUTION) ×2 IMPLANT
OIL CARTRIDGE MAESTRO DRILL (MISCELLANEOUS) ×2
PACK LAMINECTOMY NEURO (CUSTOM PROCEDURE TRAY) ×2 IMPLANT
PAD ARMBOARD 7.5X6 YLW CONV (MISCELLANEOUS) ×6 IMPLANT
PATTIES SURGICAL .5 X.5 (GAUZE/BANDAGES/DRESSINGS) IMPLANT
PATTIES SURGICAL .5 X1 (DISPOSABLE) ×1 IMPLANT
PATTIES SURGICAL 1X1 (DISPOSABLE) IMPLANT
ROD RELIN-O LORD 5.5X65MM (Rod) ×1 IMPLANT
ROD RELINE 0-0 CON M 5.0/6.0MM (Rod) ×2 IMPLANT
ROD RELINE-O LORD 5.5X50MM (Rod) ×1 IMPLANT
SCREW LOCK RELINE 5.5 TULIP (Screw) ×8 IMPLANT
SCREW RELINE 6.5X35 POLYAXIAL (Screw) ×2 IMPLANT
SPONGE LAP 4X18 RFD (DISPOSABLE) ×1 IMPLANT
SPONGE NEURO XRAY DETECT 1X3 (DISPOSABLE) IMPLANT
SPONGE SURGIFOAM ABS GEL 100 (HEMOSTASIS) ×2 IMPLANT
STRIP BIOACTIVE VITOSS 25X100X (Neuro Prosthesis/Implant) ×2 IMPLANT
SUT VIC AB 1 CT1 18XBRD ANBCTR (SUTURE) ×2 IMPLANT
SUT VIC AB 1 CT1 8-18 (SUTURE) ×4
SUT VIC AB 2-0 CP2 18 (SUTURE) ×4 IMPLANT
TAPE CLOTH SURG 4X10 WHT LF (GAUZE/BANDAGES/DRESSINGS) ×1 IMPLANT
TOWEL GREEN STERILE (TOWEL DISPOSABLE) ×2 IMPLANT
TOWEL GREEN STERILE FF (TOWEL DISPOSABLE) ×2 IMPLANT
TRAY FOLEY MTR SLVR 16FR STAT (SET/KITS/TRAYS/PACK) ×2 IMPLANT
WATER STERILE IRR 1000ML POUR (IV SOLUTION) ×2 IMPLANT

## 2018-08-03 NOTE — Progress Notes (Signed)
Per pt's equipment cbg 205 / she adjusted her pump to give bolus of insulin

## 2018-08-03 NOTE — Progress Notes (Signed)
Inpatient Diabetes Program Recommendations  AACE/ADA: New Consensus Statement on Inpatient Glycemic Control (2015)  Target Ranges:  Prepandial:   less than 140 mg/dL      Peak postprandial:   less than 180 mg/dL (1-2 hours)      Critically ill patients:  140 - 180 mg/dL   Lab Results  Component Value Date   GLUCAP 140 (H) 08/03/2018   HGBA1C 6.4 (H) 07/28/2018    Review of Glycemic Control Results for Colleen Zuniga, Colleen Zuniga (MRN [id number]) as of 08/03/2018 16:26  Ref. Range 08/03/2018 06:01  Glucose-Capillary Latest Ref Range: 70 - 99 mg/dL 902 (H)   Diabetes history: Type 1 DM Outpatient Diabetes medications: insulin pump- Novolog Current orders for Inpatient glycemic control: Insulin pump Decadron 5 mg x 1  Inpatient Diabetes Program Recommendations:     Patient was on IV insulin during surgery and had Insulin pump reapplied at 80% basal rate. Since application, patient has given 1 bolus for CBG of 205 mg/dL. Noted patient had Decadron, thus explaining increases to CBGs.   Spoke with patient regarding diabetes and home regimen for diabetes management.  Patient uses a [employer]-Slim and [name], CDE helps Dr Vassie Loll to make pump adjustment. Explained that CBGs would be collected using hospital meters. Patient agreeable. Educated impact of steroids to glucose levels and thus may require more insulin that usual immediately following surgery.  Current insulin pump settings are as follows:  Basal insulin  12A 0.95 units/hour 0400A 0.65 units/hour 0600A 0.9 units/hour 0830A 0.4 units/hour 1100A 1.00 units/hour 4P 0.4 units/hour 7P 1.00 units/hour 9P 0.90 units/hour  Total daily basal insulin: 19.25 units/24 hours  Carb Coverage 0000 1:9 1 unit for every 9 grams of carbohydrates 1100 1:8 1 unit for every 8 grams of carbohydrates 1900 1:9 1 unit for every 9 grams of carbohydrates  Insulin Sensitivity 1:48 1 unit drops blood glucose 48 mg/dl  Target Glucose Goals 11D-55M 110  mg/dl   In talking with the patient he states that his blood glucose normally runs very good and his last A1C was 6.4%.  Patient verbalized understanding of information discussed and states that he does not have any further questions related to diabetes at this time.  NURSING: Once insulin pump order set is ordered please print off the Patient insulin pump contract and flow sheet. The insulin pump contract should be signed by the patient and then placed in the chart. The patient insulin pump flow sheet will be completed by the patient at the bedside and the RN caring for the patient will use the patient's flow sheet to document in the [facility]. RN will need to complete the Nursing Insulin Pump Flowsheet at least once a shift. Patient will need to keep extra insulin pump supplies at the bedside at all times.   Thanks, Lujean Rave, MSN, RNC-OB Diabetes Coordinator [phone] (8a-5p)

## 2018-08-03 NOTE — Anesthesia Postprocedure Evaluation (Signed)
Anesthesia Post Note  Patient: Archisha Eskola  Procedure(s) Performed: Left Lumbar five-Sacral One Transforaminal Lumbar Interbody Fusion, Lumbar Five-Sacral One Posterior Lateral Arthrodesis Lumbar Four-Five Posterior Lateral Arthrodesis (Bilateral Back)     Patient location during evaluation: PACU Anesthesia Type: General Level of consciousness: awake and alert Pain management: pain level controlled Vital Signs Assessment: post-procedure vital signs reviewed and stable Respiratory status: spontaneous breathing, nonlabored ventilation and respiratory function stable Cardiovascular status: blood pressure returned to baseline and stable Postop Assessment: no apparent nausea or vomiting Anesthetic complications: no    Last Vitals:  Vitals:   08/03/18 1330 08/03/18 1419  BP: 115/76 (!) 109/52  Pulse: 89 90  Resp: 16 16  Temp:  36.6 C  SpO2: 99% 100%    Last Pain:  Vitals:   08/03/18 1419  TempSrc: Oral  PainSc:                  Beryle Lathe

## 2018-08-03 NOTE — Progress Notes (Signed)
Vitals:   08/03/18 0603 08/03/18 1245 08/03/18 1330 08/03/18 1419  BP: (!) 126/50 (!) 114/46 115/76 (!) 109/52  Pulse: 79  89 90  Resp: 18 12 16 16   Temp: 98.2 F (36.8 C) 97.6 F (36.4 C)  97.8 F (36.6 C)  TempSrc: Oral   Oral  SpO2: 98%  99% 100%  Weight: 84.2 kg     Height: 5\' 2"  (1.575 m)       Patient sitting up, eating dinner.  Comfortable.  Has ambulated in the halls.  Foley DC'd, nursing staff monitoring voiding function.  Using insulin pump and CGM.  Current blood sugar about 245, and she is making appropriate adjustments.  Patient reports complete relief of disabling radicular pain.  Very pleased with outcome.  Plan: Encouraged to ambulate at least 1 or 2 more times this evening and again more in the morning.  Continue to progress through postoperative recovery.  Hewitt Shorts, MD 08/03/2018, 5:53 PM

## 2018-08-03 NOTE — H&P (Signed)
Subjective: Patient is a 58 y.o. right-handed white female who is admitted for treatment of a large left L5-S1 extraforaminal disc herniation with disabling left lumbar radiculopathy.  Her history is notable for having undergone a 3 level L2-3, L3-4, and L4-5 XLIF with percutaneous pedicle screw fixation in June 2018.  She was doing well up until 2 months ago when a few days after having done a power walk she developed progressively worsening left lumbar radiculopathy with pain in the lateral left buttock thigh and leg extending into the lateral 3 toes of her left foot as well as into the left groin.  She developed numbness and tingling into the left foot and lateral 3 toes the left foot.  Recently pain is become increasingly disabling with pain extending around the left pelvic girdle including buttock, lateral hip, and groin, as well as extending down through the lateral left thigh and leg and into the left foot.  Her motor examination is steadily worsened with interval development of weakness of the left EHL.  Work-up included x-rays, MRI scan, and CT scan.  CT showed the most evidence of fusion developing at the L3-4 level, with less evidence of fusion at L4-5 level, and the least evidence of fusion at the L2-3 level.  MRI scan shows a new very large left L5-S1 foraminal and extraforaminal disc herniation with severe left L5 nerve root compression corresponding to her disabling left lumbar radiculopathy.  She was treated with physical therapy without relief or improvement.  She is admitted now for surgical decompression and stabilization.  Discectomy require complete left L5-S1 facetectomy which will create instability and therefore require arthrodesis.  We plan on performing a left L5-S1 transverse posterior lumbar interbody arthrodesis but may have to ultimately perform a bilateral L5-S1 posterior lumbar interbody arthrodesis.  This would be done with interbody implants and bone graft.  We will also  perform a bilateral L5-S1 posterior lateral centesis requiring placement of bilateral pedicle screws at S1 that will be tied into her existing posterior instrumentation.   Patient Active Problem List   Diagnosis Date Noted  . Lumbar stenosis 08/18/2016  . Arthritis   . Hypokalemia   . Chest pain    Past Medical History:  Diagnosis Date  . Anxiety   . Arthritis   . Back pain   . De Quervain's tenosynovitis, right 01/2012  . Degenerative disc disease, cervical    states neck is stiff, reduced range of motion  . Dental crowns present   . Depression   . Diabetes mellitus    Insulin pump  . GERD (gastroesophageal reflux disease)   . Heart murmur    states has a functional murmur, and that she has never had any problems  . Hypothyroidism   . Joint pain   . PONV (postoperative nausea and vomiting)     Past Surgical History:  Procedure Laterality Date  . ANTERIOR CERVICAL DISCECTOMY  02/20/2016  . ANTERIOR LAT LUMBAR FUSION N/A 08/18/2016   Procedure: ANTERIOR LATERAL LUMBAR FUSION LUMBAR TWO- LUMBAR THREE, LUMBAR THREE- LUMBAR FOUR, LUMBAR FOUR- LUMBAR FIVE; LUMBAR TWO- LUMBAR FIVE PEDICLE SCREW FIXATION;  Surgeon: Shirlean Kelly, MD;  Location: [facility] OR;  Service: Neurosurgery;  Laterality: N/A;  ANTERIOR LATERAL LUMBAR FUSION LUMBAR 2- LUMBAR 3, LUMBAR 3- LUMBAR 4-, LUMBAR 4- LUMBAR 5; LUMBAR 2- LUMBAR 5 PEDICLE SCREW FIXATION  . Cataracts Bilateral   . CESAREAN SECTION    . DORSAL COMPARTMENT RELEASE  02/08/2002   first dorsal compartment left wrist  .  DORSAL COMPARTMENT RELEASE  02/03/2012   Procedure: RELEASE DORSAL COMPARTMENT (DEQUERVAIN);  Surgeon: Nicki Reaper, MD;  Location: [facility];  Service: Orthopedics;  Laterality: Right;  RELEASE DEQUERVAINS RIGHT WRIST  . KNEE ARTHROSCOPY  10/14/2004   right  . SHOULDER ARTHROSCOPY  05/05/2005   left  . TRIGGER FINGER RELEASE  01/07/2011   release A1 pulley left thumb  . TRIGGER FINGER RELEASE     x 5 other fingers     Medications Prior to Admission  Medication Sig Dispense Refill Last Dose  . acetaminophen (TYLENOL) 500 MG tablet Take 1,000 mg by mouth 3 (three) times daily.    08/03/2018 at 0500  . ALPRAZolam (XANAX) 0.5 MG tablet Take 0.25-0.5 mg by mouth at bedtime as needed for sleep or anxiety.  3 08/03/2018 at 0500  . Ascorbic Acid (VITAMIN C WITH ROSE HIPS) 500 MG tablet Take 500 mg by mouth daily after breakfast.    Past Week at Unknown time  . atorvastatin (LIPITOR) 10 MG tablet Take 10 mg by mouth daily after supper.   08/02/2018 at Unknown time  . buPROPion (WELLBUTRIN XL) 300 MG 24 hr tablet Take 300 mg by mouth daily before breakfast.   08/03/2018 at 0500  . CALCIUM-VITAMIN D PO Take 1 tablet by mouth 2 (two) times a day.   Past Week at Unknown time  . cyclobenzaprine (FLEXERIL) 5 MG tablet Take 5 mg by mouth 3 (three) times daily as needed.   08/03/2018 at 0500  . desloratadine (CLARINEX) 5 MG tablet Take 5 mg by mouth daily before breakfast.   08/03/2018 at 0500  . etodolac (LODINE) 500 MG tablet Take 500 mg by mouth daily after breakfast.   Past Week at Unknown time  . gabapentin (NEURONTIN) 300 MG capsule Take 300 mg by mouth 3 (three) times daily.   08/03/2018 at 0500  . insulin aspart (NOVOLOG) 100 UNIT/ML injection Continuous with insulin pump   08/03/2018 at Unknown time  . Insulin Human (INSULIN PUMP) SOLN Inject into the skin continuous. Novolog   08/03/2018 at Unknown time  . irbesartan (AVAPRO) 300 MG tablet Take 300 mg by mouth daily after breakfast.   08/02/2018 at Unknown time  . levothyroxine (SYNTHROID, LEVOTHROID) 137 MCG tablet Take 137 mcg by mouth daily before breakfast.   08/03/2018 at 0500  . Magnesium 500 MG TABS Take 500 mg by mouth daily after breakfast.   Past Week at Unknown time  . Multiple Vitamin (MULTIVITAMIN WITH MINERALS) TABS tablet Take 1 tablet by mouth daily after breakfast.   Past Week at Unknown time  . potassium gluconate 595 (99 K) MG TABS tablet Take 595 mg by  mouth daily after breakfast.   Past Week at Unknown time  . Propylene Glycol (SYSTANE COMPLETE) 0.6 % SOLN Place 2 drops into both eyes 3 (three) times daily as needed (dry eyes).   08/02/2018 at Unknown time  . Continuous Glucose Monitor DEVI 1 Device by Other route continuous.   Taking  . loratadine (CLARITIN) 10 MG tablet Take 10 mg by mouth at bedtime.   Unknown at Unknown time  . metoCLOPramide (REGLAN) 10 MG tablet Take 10 mg by mouth daily as needed for nausea.   More than a month at Unknown time   Allergies  Allergen Reactions  . Penicillins Anaphylaxis and Other (See Comments)    CLOSES THROAT PATIENT HAD A PCN REACTION WITH IMMEDIATE RASH, FACIAL/TONGUE/THROAT SWELLING, SOB, OR LIGHTHEADEDNESS WITH HYPOTENSION:  #  #  #  YES  #  #  #   Has patient had a PCN reaction causing severe rash involving mucus membranes or skin necrosis: UNKNOWN  Has patient had a PCN reaction that required hospitalization:No Has patient had a PCN reaction occurring within the last 10 years:No   . Hydrocodone-Acetaminophen     Nausea/vomiting with or without food  . Adhesive [Tape] Rash    Itching    Social History   Tobacco Use  . Smoking status: Former Smoker    Last attempt to quit: 03/11/1991    Years since quitting: 27.4  . Smokeless tobacco: Never Used  Substance Use Topics  . Alcohol use: Yes    Comment: rarely    Family History  Problem Relation Age of Onset  . Stroke Father   . Diabetes Father   . Heart disease Father   . Kidney disease Father   . Obesity Father      Review of Systems Pertinent items noted in HPI and remainder of comprehensive ROS otherwise negative.  Objective: Vital signs in last 24 hours: Temp:  [98.2 F (36.8 C)] 98.2 F (36.8 C) (05/26 0603) Pulse Rate:  [79] 79 (05/26 0603) Resp:  [18] 18 (05/26 0603) BP: (126)/(50) 126/50 (05/26 0603) SpO2:  [98 %] 98 % (05/26 0603) Weight:  [84.2 kg] 84.2 kg (05/26 0603)  EXAM: Patient is a well-developed  well-nourished white female in no acute distress.   Lungs are clear to auscultation , the patient has symmetrical respiratory excursion. Heart has a regular rate and rhythm normal S1 and S2 no murmur.   Abdomen is soft nontender nondistended bowel sounds are present. Extremity examination shows no clubbing cyanosis or edema. Motor examination shows 5/5 strength in the iliopsoas, quadriceps, dorsiflexor, and plantar flexor bilaterally as well as the right EHL, however the left EHL is 4-/5.  Sensation is intact to pinprick to the distal lower extremities.  Reflexes are absent at the quadriceps and gastrocnemius bilaterally.  Toes are downgoing bilaterally.  Her gait and stance favor the left lower extremity.  Data Review:CBC    Component Value Date/Time   WBC 6.3 07/28/2018 0920   RBC 3.89 07/28/2018 0920   HGB 12.3 07/28/2018 0920   HGB 11.5 04/29/2018 1354   HCT 37.8 07/28/2018 0920   HCT 34.7 04/29/2018 1354   PLT 251 07/28/2018 0920   MCV 97.2 07/28/2018 0920   MCV 93 04/29/2018 1354   MCH 31.6 07/28/2018 0920   MCHC 32.5 07/28/2018 0920   RDW 12.0 07/28/2018 0920   RDW 11.6 (L) 04/29/2018 1354   LYMPHSABS 1.9 04/29/2018 1354   EOSABS 0.0 04/29/2018 1354   BASOSABS 0.0 04/29/2018 1354                          BMET    Component Value Date/Time   NA 138 07/28/2018 0920   NA 139 04/29/2018 1354   K 5.0 07/28/2018 0920   CL 105 07/28/2018 0920   CO2 24 07/28/2018 0920   GLUCOSE 135 (H) 07/28/2018 0920   BUN 21 (H) 07/28/2018 0920   BUN 18 04/29/2018 1354   CREATININE 0.96 07/28/2018 0920   CALCIUM 10.1 07/28/2018 0920   GFRNONAA >60 07/28/2018 0920   GFRAA >60 07/28/2018 0920     Assessment/Plan: She with disabling left lumbar radiculopathy secondary to a left L5-S1 foraminal/extraforaminal disc herniation.  She is status post previous L2-L5 arthrodesis and will require both decompression and stabilization at L5-S1  as described above.  I've discussed with the patient the  nature of his condition, the nature the surgical procedure, the typical length of surgery, hospital stay, and overall recuperation, the limitations postoperatively, and risks of surgery. I discussed risks including risks of infection, bleeding, possibly need for transfusion, the risk of nerve root dysfunction with pain, weakness, numbness, or paresthesias, the risk of dural tear and CSF leakage and possible need for further surgery, the risk of failure of the arthrodesis and possibly for further surgery, the risk of anesthetic complications including myocardial infarction, stroke, pneumonia, and death. We discussed the need for postoperative immobilization in a lumbar brace. Understanding all this the patient does wish to proceed with surgery and is admitted for such.   Hewitt Shorts, MD 08/03/2018 7:28 AM

## 2018-08-03 NOTE — Op Note (Signed)
08/03/2018  12:44 PM  PATIENT:  Colleen Zuniga  58 y.o. female  PRE-OPERATIVE DIAGNOSIS: Left L5-S1 foraminal/extraforaminal HNP; left lumbar radiculopathy; status post lumbar fusion; lumbar pseudoarthrosis; lumbar spondylosis; lumbar degenerative disc disease  POST-OPERATIVE DIAGNOSIS:  Left L5-S1 foraminal/extraforaminal HNP; left lumbar radiculopathy; status post lumbar fusion; lumbar pseudoarthrosis; lumbar spondylosis; lumbar degenerative disc disease  PROCEDURE:  Procedure(s): Left L5-S1 decompression including laminectomy, complete facetectomy, foraminotomies for the exiting left L5 and S1 nerve roots, and microdiscectomy; left L5-S1 transforaminal lumbar interbody arthrodesis with a titanium Nuvasive Coroent TLIF interbody implant, Vitoss BA with bone marrow aspirate, and infuse; revision of L4-5 arthrodesis with posterior lateral arthrodesis with Vitoss BA with bone marrow aspirate and infuse; bilateral L5-S1 posterior lateral arthrodesis with Nuvasive Reline S1 pedicle screws secured to the existing Reline posterior instrumentation with connectors and rods, Vitoss BA with bone marrow aspirate, and infuse  SURGEON: Shirlean Kellyobert , MD  ASSISTANTS: Lisbeth RenshawNeelesh Nundkumar, MD  ANESTHESIA:   general  EBL:  Total I/O In: 1000 [I.V.:1000] Out: 1000 [Urine:850; Blood:150]  BLOOD ADMINISTERED:none  CELL SAVER GIVEN: Cell Saver technician felt that there was insufficient blood loss to process the collected blood  COUNT:  Correct per nursing staff  DICTATION: Patient is brought to the operating room placed under general endotracheal anesthesia. The patient was turned to prone position the lumbar region was prepped with Betadine soap and solution and draped in a sterile fashion. The midline was infiltrated with local anesthesia with epinephrine. A localizing x-ray was taken and then a midline incision was made carried down through the subcutaneous tissue, bipolar cautery and electrocautery  were used to maintain hemostasis. Dissection was carried down to the lumbar fascia. The fascia was incised bilaterally and the paraspinal muscles were dissected with a spinous process and lamina in a subperiosteal fashion.  The existing posterior instrumentation including the L4 and L5 screws on the left and the L5 screw on the right along with the existing rod rostral to these was exposed, and cleaned of soft tissue scarring.  The L5-S1 interlaminar space with identified, and another x-ray was taken for localization and the L5-S1 level was confirmed. Dissection was then carried out laterally over the existing posterior instrumentation and the L5-S1 facet complexes.  The transverse processes of L4 and L5 and the ala of S1 were exposed and decorticated.  We then proceeded with the left L5-S1 decompression.  Left L5 and S1 laminotomies were performed using the high-speed drill and Kerrison punches. Dissection was carried out laterally including left L5-S1 facetectomy and foraminotomies with decompression of the stenotic compression of the exiting left L5 and S1 nerve roots. Once the decompression stenotic compression of the thecal sac and exiting nerve roots was completed we proceeded with the posterior lumbar interbody arthrodesis. The annulus was incised on the left side and the disc space entered. A thorough discectomy was performed using pituitary rongeurs and curettes.  Particular attention was paid to the left L5-S1 neuroforamen and extraforaminal space, ensuring that good decompression of the left L5 nerve root within the lateral foramen and extraforaminal space was achieved.  Once the discectomy was completed we began to prepare the endplate surfaces removing the cartilaginous endplates surface. We then measured the height of the intervertebral disc space. We selected a 12 x 9 x 25 x 8 interbody implant.  The C-arm fluoroscope was then draped and brought in the field and we identified the pedicle entry  points bilaterally at the S1 level.  We first placed the right S1  pedicle screw.  The right S1 pedicle was probed and we aspirated bone marrow aspirate from the vertebral body,  This was injected over two 10 cc strips of Vitoss BA. The pedicle was examined with a ball probe and good bony surfaces were found.  The right S1 pedicle was then tapped with a 6.0 mm tap, again examined with the ball probe, good threading was found. We then placed a 6.5 x 35 millimeter screw.  We then packed the interbody implant with Vitoss BA with bone marrow aspirate and infuse.  Carefully retracting the thecal sac and nerve root medially, we gently tamped the TLIF implant into the interbody space.  Using specialized tamps, the implant was rotated to a horizontal position, and then gently tamped anteriorly under C-arm fluoroscopic guidance.  Final positioning looked good.  We then packed the posterior interbody space with Vitoss BA with bone marrow aspirate and infuse.  We then placed the left S1 pedicle screw.  The left S1 pedicle was probed, examined with a ball probe, good bony surfaces were found.  The left S1 pedicle was then tapped with a 6.0 mm tap, again examined with the ball probe, good threading was found.  We then placed a 6.5 x 35 mm screw.  We then packed the lateral gutter from the L4 transverse process, over the L5 transverse process and the S1 ala bilaterally with Vitoss BA with bone marrow aspirate and infuse. We then selected pre-lordosed rods.  We selected under rod connectors to secure the new rod to the existing rods bilaterally (on the left side above the existing L4 screw and on the right side above the existing L5 screw we also used post connectors secured to the S1 screws that have been placed.  We used a 5.5 x 65 mm rod on the left and a 5.5 x 50 mm rod on the right.  Locking caps were used, and once all 8 caps were in place, final tightening was performed with counter torques.  The wound had been  irrigated multiple times during the procedure with saline solution and bacitracin solution, good hemostasis was established with a combination of bipolar cautery, Gelfoam with thrombin, and Surgifoam. Once good hemostasis was confirmed we proceeded with closure paraspinal muscles deep fascia and Scarpa's fascia were closed with interrupted undyed 1 Vicryl sutures the subcutaneous and subcuticular closed with interrupted inverted 2-0 undyed Vicryl sutures the skin edges were approximated with Dermabond.  A dressing of sterile gauze and Hypafix was applied.  Following surgery the patient was turned back to the supine position to be reversed and the anesthetic extubated and transferred to the recovery room for further care.  PLAN OF CARE: Admit to inpatient   PATIENT DISPOSITION:  PACU - hemodynamically stable.   Delay start of Pharmacological VTE agent (>24hrs) due to surgical blood loss or risk of bleeding:  yes

## 2018-08-03 NOTE — Progress Notes (Signed)
Pt arrived to unit via stretcher. Pt assisted onto bed. VSS. A&OX4. Pt educated about call-bell. Bed lowest position with call-bell in reach. Will continue to monitor.

## 2018-08-03 NOTE — Progress Notes (Signed)
Per crna: insulin pump/monitor set at 80% of basal rate in preop / cbg is now 166 per device

## 2018-08-03 NOTE — Anesthesia Procedure Notes (Signed)
Procedure Name: Intubation Date/Time: 08/03/2018 7:46 AM Performed by: Trinna Post., CRNA Pre-anesthesia Checklist: Patient identified, Emergency Drugs available, Suction available, Patient being monitored and Timeout performed Patient Re-evaluated:Patient Re-evaluated prior to induction Oxygen Delivery Method: Circle system utilized Preoxygenation: Pre-oxygenation with 100% oxygen Induction Type: IV induction, Rapid sequence and Cricoid Pressure applied Laryngoscope Size: Mac and 3 Grade View: Grade I Tube type: Oral Tube size: 7.0 mm Number of attempts: 1 Airway Equipment and Method: Stylet Placement Confirmation: ETT inserted through vocal cords under direct vision,  positive ETCO2 and breath sounds checked- equal and bilateral Secured at: 23 cm Tube secured with: Tape Dental Injury: Teeth and Oropharynx as per pre-operative assessment

## 2018-08-03 NOTE — Transfer of Care (Signed)
Immediate Anesthesia Transfer of Care Note  Patient: Colleen Zuniga  Procedure(s) Performed: Left Lumbar five-Sacral One Transforaminal Lumbar Interbody Fusion, Lumbar Five-Sacral One Posterior Lateral Arthrodesis Lumbar Four-Five Posterior Lateral Arthrodesis (Bilateral Back)  Patient Location: PACU  Anesthesia Type:General  Level of Consciousness: awake, alert , oriented and drowsy  Airway & Oxygen Therapy: Patient Spontanous Breathing and Patient connected to nasal cannula oxygen  Post-op Assessment: Report given to RN and Post -op Vital signs reviewed and stable  Post vital signs: Reviewed and stable  Last Vitals:  Vitals Value Taken Time  BP    Temp    Pulse 77 08/03/2018 12:46 PM  Resp 9 08/03/2018 12:46 PM  SpO2 99 % 08/03/2018 12:46 PM  Vitals shown include unvalidated device data.  Last Pain:  Vitals:   08/03/18 0603  TempSrc: Oral  PainSc: 9       Patients Stated Pain Goal: 2 (08/03/18 0603)  Complications: No apparent anesthesia complications

## 2018-08-04 LAB — GLUCOSE, CAPILLARY: Glucose-Capillary: 143 mg/dL — ABNORMAL HIGH (ref 70–99)

## 2018-08-04 MED ORDER — OXYCODONE-ACETAMINOPHEN 5-325 MG PO TABS: 1.0000 | ORAL_TABLET | ORAL | 0 refills | Status: DC | PRN

## 2018-08-04 NOTE — Discharge Summary (Signed)
Physician Discharge Summary  Patient ID: Colleen Zuniga MRN: [id number] DOB/AGE: 1960/09/29 58 y.o.  Admit date: 08/03/2018 Discharge date: 08/04/2018  Admission Diagnoses:  Left L5-S1 foraminal/extraforaminal HNP; left lumbar radiculopathy; status post lumbar fusion; lumbar pseudoarthrosis; lumbar spondylosis; lumbar degenerative disc disease  Discharge Diagnoses:  Left L5-S1 foraminal/extraforaminal HNP; left lumbar radiculopathy; status post lumbar fusion; lumbar pseudoarthrosis; lumbar spondylosis; lumbar degenerative disc disease Active Problems:   Lumbar disc herniation   Discharged Condition: good  Hospital Course: Patient was admitted, underwent a left L5-S1 lumbar decompression including discectomy, left L5-S1 TLIF, bilateral L4-5 posterior lateral arthrodesis and bilateral L4-5 posterior lateral arthrodesis.  She is done well following surgery.  She has had excellent relief of her radicular pain.  She is up and ambulating actively in the halls.  Her dressing was removed on the day of discharge and her incision is healing nicely.  There is no erythema, ecchymosis, swelling, or drainage.  The wound was left open to air.  She is being discharged home with instructions regarding wound care and activities.  She is scheduled for follow-up with me in the office in about 3 weeks with x-rays.  Discharge Exam: Blood pressure (!) 111/47, pulse 80, temperature 98 F (36.7 C), temperature source Oral, resp. rate 16, height 5\' 2"  (1.575 m), weight 84.2 kg, SpO2 99 %.  Disposition: Discharge disposition: 01-Home or Self Care       Discharge Instructions    Discharge wound care:   Complete by:  As directed    Leave the wound open to air. Shower daily with the wound uncovered. Water and soapy water should run over the incision area. Do not wash directly on the incision for 2 weeks. Remove the glue after 2 weeks.   Driving Restrictions   Complete by:  As directed    No driving for 2  weeks. May ride in the car locally now. May begin to drive locally in 2 weeks.   Other Restrictions   Complete by:  As directed    Walk gradually increasing distances out in the fresh air at least twice a day. Walking additional 6 times inside the house, gradually increasing distances, daily. No bending, lifting, or twisting. Perform activities between shoulder and waist height (that is at counter height when standing or table height when sitting).     Allergies as of 08/04/2018      Reactions   Penicillins Anaphylaxis, Other (See Comments)   CLOSES THROAT PATIENT HAD A PCN REACTION WITH IMMEDIATE RASH, FACIAL/TONGUE/THROAT SWELLING, SOB, OR LIGHTHEADEDNESS WITH HYPOTENSION:  #  #  #  YES  #  #  #   Has patient had a PCN reaction causing severe rash involving mucus membranes or skin necrosis: UNKNOWN  Has patient had a PCN reaction that required hospitalization:No Has patient had a PCN reaction occurring within the last 10 years:No   Hydrocodone-acetaminophen    Nausea/vomiting with or without food   Adhesive [tape] Rash   Itching      Medication List    TAKE these medications   acetaminophen 500 MG tablet Commonly known as:  TYLENOL Take 1,000 mg by mouth 3 (three) times daily.   ALPRAZolam 0.5 MG tablet Commonly known as:  XANAX Take 0.25-0.5 mg by mouth at bedtime as needed for sleep or anxiety.   atorvastatin 10 MG tablet Commonly known as:  LIPITOR Take 10 mg by mouth daily after supper.   buPROPion 300 MG 24 hr tablet Commonly known as:  WELLBUTRIN XL  Take 300 mg by mouth daily before breakfast.   CALCIUM-VITAMIN D PO Take 1 tablet by mouth 2 (two) times a day.   Continuous Glucose Monitor Devi 1 Device by Other route continuous.   cyclobenzaprine 5 MG tablet Commonly known as:  FLEXERIL Take 5 mg by mouth 3 (three) times daily as needed.   desloratadine 5 MG tablet Commonly known as:  CLARINEX Take 5 mg by mouth daily before breakfast.   etodolac 500 MG  tablet Commonly known as:  LODINE Take 500 mg by mouth daily after breakfast.   gabapentin 300 MG capsule Commonly known as:  NEURONTIN Take 300 mg by mouth 3 (three) times daily.   insulin aspart 100 UNIT/ML injection Commonly known as:  novoLOG Continuous with insulin pump   insulin pump Soln Inject into the skin continuous. Novolog   irbesartan 300 MG tablet Commonly known as:  AVAPRO Take 300 mg by mouth daily after breakfast.   levothyroxine 137 MCG tablet Commonly known as:  SYNTHROID Take 137 mcg by mouth daily before breakfast.   loratadine 10 MG tablet Commonly known as:  CLARITIN Take 10 mg by mouth at bedtime.   Magnesium 500 MG Tabs Take 500 mg by mouth daily after breakfast.   metoCLOPramide 10 MG tablet Commonly known as:  REGLAN Take 10 mg by mouth daily as needed for nausea.   multivitamin with minerals Tabs tablet Take 1 tablet by mouth daily after breakfast.   oxyCODONE-acetaminophen 5-325 MG tablet Commonly known as:  PERCOCET/ROXICET Take 1-2 tablets by mouth every 4 (four) hours as needed (pain).   potassium gluconate 595 (99 K) MG Tabs tablet Take 595 mg by mouth daily after breakfast.   Systane Complete 0.6 % Soln Generic drug:  Propylene Glycol Place 2 drops into both eyes 3 (three) times daily as needed (dry eyes).   vitamin C with rose hips 500 MG tablet Take 500 mg by mouth daily after breakfast.            Discharge Care Instructions  (From admission, onward)         Start     Ordered   08/04/18 0000  Discharge wound care:    Comments:  Leave the wound open to air. Shower daily with the wound uncovered. Water and soapy water should run over the incision area. Do not wash directly on the incision for 2 weeks. Remove the glue after 2 weeks.   08/04/18 1002           Signed: Hewitt ShortsNUDELMAN,ROBERT W 08/04/2018, 10:02 AM

## 2018-08-04 NOTE — Progress Notes (Signed)
Patient ambulated in hallway x2 with Dr.Nudelman and myself. Her pain is controled. She has called her husband to come pick her up. All discharge instructions were given by D. Nudelman, I also reviewed them with the patient. All questions answered and we are just awaiting her ride to arrive.

## 2018-08-04 NOTE — Progress Notes (Signed)
Pt ambulated in hallway with Cora Collum RN and is to be discharged. Doing incentive spirometry well without further need for education,  and denies pain requiring any medication intervention, Insulin pump present and patient managing this. Appetite good this am. Denies numbness, weakness, or any loss of sensation. States that pain present prior to surgery is not there any more. Gabriel Cirri RN

## 2018-08-06 MED FILL — Heparin Sodium (Porcine) Inj 1000 Unit/ML: INTRAMUSCULAR | Qty: 30 | Status: AC

## 2018-08-06 MED FILL — Sodium Chloride IV Soln 0.9%: INTRAVENOUS | Qty: 1000 | Status: AC

## 2018-12-16 ENCOUNTER — Encounter: Payer: Self-pay | Admitting: Gastroenterology

## 2018-12-22 ENCOUNTER — Other Ambulatory Visit: Payer: Self-pay | Admitting: Internal Medicine

## 2018-12-22 DIAGNOSIS — I739 Peripheral vascular disease, unspecified: Secondary | ICD-10-CM

## 2019-01-03 ENCOUNTER — Ambulatory Visit
Admission: RE | Admit: 2019-01-03 | Discharge: 2019-01-03 | Disposition: A | Discharge status: Home / Self Care | Payer: PRIVATE HEALTH INSURANCE | Source: Ambulatory Visit | Attending: Internal Medicine | Admitting: Internal Medicine

## 2019-01-03 DIAGNOSIS — I739 Peripheral vascular disease, unspecified: Secondary | ICD-10-CM

## 2019-01-17 ENCOUNTER — Telehealth: Payer: Self-pay | Admitting: *Deleted

## 2019-01-17 ENCOUNTER — Ambulatory Visit (INDEPENDENT_AMBULATORY_CARE_PROVIDER_SITE_OTHER): Payer: PRIVATE HEALTH INSURANCE | Admitting: Gastroenterology

## 2019-01-17 ENCOUNTER — Encounter: Payer: Self-pay | Admitting: Gastroenterology

## 2019-01-17 VITALS — BP 102/60 | Temp 96.6°F | Ht 62.0 in | Wt 173.0 lb

## 2019-01-17 DIAGNOSIS — Z1159 Encounter for screening for other viral diseases: Secondary | ICD-10-CM | POA: Diagnosis not present

## 2019-01-17 DIAGNOSIS — D649 Anemia, unspecified: Secondary | ICD-10-CM | POA: Diagnosis not present

## 2019-01-17 DIAGNOSIS — R7989 Other specified abnormal findings of blood chemistry: Secondary | ICD-10-CM | POA: Diagnosis not present

## 2019-01-17 MED ORDER — NA SULFATE-K SULFATE-MG SULF 17.5-3.13-1.6 GM/177ML PO SOLN: 1.0000 | Freq: Once | ORAL | 0 refills | Status: AC

## 2019-01-17 NOTE — Telephone Encounter (Signed)
Colleen Zuniga Jan 01, 1961 [id number]   Dear Dr. Dagmar Hait    Dr. Ardis Hughs has scheduled the above patient for a(n) colonoscopy on 02/02/19.  Our records show that he/she is on insulin therapy via an insulin pump.  Our colonoscopy prep protocol requires that:  the patient must be on a clear liquid diet the entire day prior to the procedure date as well as the morning of the procedure  the patient must be NPO for 3 to 4 hours prior to the procedure   the patient must consume a PEG 3350 solution to prepare for the procedure.  Please advise [place] of any adjustments that need to be made to the patient's insulin pump therapy prior to the above procedure date.    Please route or fax back this completed form to me at [phone] .  If you have any questions, please call me at [phone].  Thank you for your help with this matter.  Sincerely,  Caryl Asp, CMA Owens Loffler, MD    Physician Recommendation:  ________________________________________________  ________________________________________________________________________  ________________________________________________________________________  ________________________________________________________________________

## 2019-01-17 NOTE — Patient Instructions (Addendum)
If you are age 60 or older, your body mass index should be between 23-30. Your Body mass index is 31.64 kg/m. If this is out of the aforementioned range listed, please consider follow up with your Primary Care Provider.  If you are age 77 or younger, your body mass index should be between 19-25. Your Body mass index is 31.64 kg/m. If this is out of the aformentioned range listed, please consider follow up with your Primary Care Provider.    Your provider has requested that you go for lab work.    You have been scheduled for an abdominal ultrasound at [facility] (1st floor of hospital) on Friday 01/21/2019 at 11am. Please arrive 15 minutes prior to your appointment for registration. Make certain not to have anything to eat or drink 6 hours prior to your appointment. Should you need to reschedule your appointment, please contact radiology at [phone]. This test typically takes about 30 minutes to perform.  You have been scheduled for a colonoscopy. Please follow written instructions given to you at your visit today.  Please pick up your prep supplies at the pharmacy within the next 1-3 days. If you use inhalers (even only as needed), please bring them with you on the day of your procedure.

## 2019-01-17 NOTE — Progress Notes (Signed)
HPI: This is a very pleasant 58 year old woman who was referred to me by Prince Solian, MD  to evaluate normocytic anemia, elevated liver tests.    Chief complaint is normocytic anemia, elevated liver tests  She believes that her liver numbers, enzymes have been intermittently elevated for about 3 years.  She is not sure why this has been going on.  She has no significant abdominal pains.  She has no overt GI bleeding.  She does take Lodine 500 mg once daily.  Blood counts and liver tests as below from about 1 month ago.  Colon cancer does not run in her family  She has been trying to lose weight with weight watchers but has been unsuccessful.   Old Data Reviewed: Colonoscopy December 2010 for routine risk colon cancer screening found diverticulosis, small external hemorrhoids but was otherwise normal.  She was recommended to have repeat colon cancer screening with colonoscopy at 10-year interval  Blood work October 2020 CBC shows a hemoglobin of 10.9, MCV 90, platelets 235, ferritin 26, iron and TIBC were normal.  Alk phos 151, AST 43, ALT 36 otherwise LFTs were completely normal    Review of systems: Pertinent positive and negative review of systems were noted in the above HPI section. All other review negative.   Past Medical History:  Diagnosis Date  . Anxiety   . Arthritis   . Back pain   . De Quervain's tenosynovitis, right 01/2012  . Degenerative disc disease, cervical    states neck is stiff, reduced range of motion  . Dental crowns present   . Depression   . Diabetes mellitus    Insulin pump  . GERD (gastroesophageal reflux disease)   . Heart murmur    states has a functional murmur, and that she has never had any problems  . Hypothyroidism   . Joint pain   . PONV (postoperative nausea and vomiting)     Past Surgical History:  Procedure Laterality Date  . ANTERIOR CERVICAL DISCECTOMY  02/20/2016  . ANTERIOR LAT LUMBAR FUSION N/A 08/18/2016   Procedure:  ANTERIOR LATERAL LUMBAR FUSION LUMBAR TWO- LUMBAR THREE, LUMBAR THREE- LUMBAR FOUR, LUMBAR FOUR- LUMBAR FIVE; LUMBAR TWO- LUMBAR FIVE PEDICLE SCREW FIXATION;  Surgeon: Jovita Gamma, MD;  Location: [facility];  Service: Neurosurgery;  Laterality: N/A;  ANTERIOR LATERAL LUMBAR FUSION LUMBAR 2- LUMBAR 3, LUMBAR 3- LUMBAR 4-, LUMBAR 4- LUMBAR 5; LUMBAR 2- LUMBAR 5 PEDICLE SCREW FIXATION  . Cataracts Bilateral   . CESAREAN SECTION    . DORSAL COMPARTMENT RELEASE  02/08/2002   first dorsal compartment left wrist  . DORSAL COMPARTMENT RELEASE  02/03/2012   Procedure: RELEASE DORSAL COMPARTMENT (DEQUERVAIN);  Surgeon: Wynonia Sours, MD;  Location: [facility];  Service: Orthopedics;  Laterality: Right;  RELEASE DEQUERVAINS RIGHT WRIST  . KNEE ARTHROSCOPY  10/14/2004   right  . SHOULDER ARTHROSCOPY  05/05/2005   left  . TRIGGER FINGER RELEASE  01/07/2011   release A1 pulley left thumb  . TRIGGER FINGER RELEASE     x 5 other fingers    Current Outpatient Medications  Medication Sig Dispense Refill  . acetaminophen (TYLENOL) 500 MG tablet Take 325 mg by mouth every morning.     Marland Kitchen ALPRAZolam (XANAX) 0.5 MG tablet Take 0.25-0.5 mg by mouth at bedtime as needed for sleep or anxiety.  3  . Ascorbic Acid (VITAMIN C WITH ROSE HIPS) 500 MG tablet Take 500 mg by mouth daily after breakfast.     .  atorvastatin (LIPITOR) 10 MG tablet Take 10 mg by mouth daily after supper.    Marland Kitchen buPROPion (WELLBUTRIN XL) 300 MG 24 hr tablet Take 300 mg by mouth daily before breakfast.    . CALCIUM-VITAMIN D PO Take 1 tablet by mouth 2 (two) times a day.    . Continuous Glucose Monitor DEVI 1 Device by Other route continuous.    Marland Kitchen desloratadine (CLARINEX) 5 MG tablet Take 5 mg by mouth daily before breakfast.    . etodolac (LODINE) 500 MG tablet Take 500 mg by mouth daily after breakfast.    . gabapentin (NEURONTIN) 300 MG capsule Take 300 mg by mouth 3 (three) times daily.    . insulin aspart (NOVOLOG) 100 UNIT/ML  injection Continuous with insulin pump    . Insulin Human (INSULIN PUMP) SOLN Inject into the skin continuous. Novolog    . irbesartan (AVAPRO) 300 MG tablet Take 300 mg by mouth daily after breakfast.    . levothyroxine (SYNTHROID, LEVOTHROID) 137 MCG tablet Take 137 mcg by mouth daily before breakfast.    . Magnesium 500 MG TABS Take 500 mg by mouth daily after breakfast.    . metoCLOPramide (REGLAN) 10 MG tablet Take 10 mg by mouth daily as needed for nausea.    . Multiple Vitamin (MULTIVITAMIN WITH MINERALS) TABS tablet Take 1 tablet by mouth daily after breakfast.    . potassium gluconate 595 (99 K) MG TABS tablet Take 595 mg by mouth daily after breakfast.    . Propylene Glycol (SYSTANE COMPLETE) 0.6 % SOLN Place 2 drops into both eyes 3 (three) times daily as needed (dry eyes).     No current facility-administered medications for this visit.     Allergies as of 01/17/2019 - Review Complete 01/17/2019  Allergen Reaction Noted  . Penicillins Anaphylaxis and Other (See Comments) 02/14/2009  . Hydrocodone-acetaminophen  06/16/2016  . Adhesive [tape] Rash 01/30/2012    Family History  Problem Relation Age of Onset  . Stroke Father   . Diabetes Father   . Heart disease Father   . Kidney disease Father   . Obesity Father     Social History   Socioeconomic History  . Marital status: Married    Spouse name: Ashawna Hanback  . Number of children: Not on file  . Years of education: Not on file  . Highest education level: Not on file  Occupational History  . 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 Financial resource strain: Not on file  . Food insecurity    Worry: Not on file    Inability: Not on file  . Transportation needs    Medical: Not on file    Non-medical: Not on file  Tobacco Use  . Smoking status: Former Smoker    Quit date: 03/11/1991    Years since quitting: 27.8  . Smokeless tobacco: Never Used  Substance and Sexual Activity  . Alcohol use: Yes    Comment:  rarely  . Drug use: No  . Sexual activity: Not on file  Lifestyle  . Physical activity    Days per week: Not on file    Minutes per session: Not on file  . Stress: Not on file  Relationships  . Social Herbalist on phone: Not on file    Gets together: Not on file    Attends religious service: Not on file    Active member of club or organization: Not on file    Attends meetings of  clubs or organizations: Not on file    Relationship status: Not on file  . Intimate partner violence    Fear of current or ex partner: Not on file    Emotionally abused: Not on file    Physically abused: Not on file    Forced sexual activity: Not on file  Other Topics Concern  . Not on file  Social History Narrative  . Not on file     Physical Exam: BP 102/60   Temp (!) 96.6 F (35.9 C)   Ht _0  (1.575 m)   Wt 173 lb (78.5 kg)   LMP  (LMP Unknown)   BMI 31.64 kg/m  Constitutional: generally well-appearing Psychiatric: alert and oriented x3 Eyes: extraocular movements intact Mouth: oral pharynx moist, no lesions Neck: supple no lymphadenopathy Cardiovascular: heart regular rate and rhythm Lungs: clear to auscultation bilaterally Abdomen: soft, nontender, nondistended, no obvious ascites, no peritoneal signs, normal bowel sounds Extremities: no lower extremity edema bilaterally Skin: no lesions on visible extremities   Assessment and plan: 58 y.o. female with elevated liver tests, normocytic anemia  First she is not iron deficient.  Her anemia is likely multifactorial, chronic disease related, perhaps related to her type 1 diabetes for the past 50 years.  Her last colon cancer screening was last colonoscopy almost exactly 10 years ago I recommended we get that up-to-date with a repeat colonoscopy at her soonest convenience.  Second she has slightly elevated liver tests, alkaline phosphatase, AST and ALT.her BMI is about 32 and so certainly fatty liver disease is high on the  list of possibilities here.  I recommended battery of blood tests as well as abdominal ultrasound to check for other potential causes such as autoimmune hepatitis, viral hepatitis, biliary issues.  See blood tests in her AVS for full details.  Please see the "Patient Instructions" section for addition details about the plan.   Owens Loffler, MD [facility] 01/17/2019, 2:49 PM  Cc: Prince Solian, MD

## 2019-01-20 ENCOUNTER — Ambulatory Visit (HOSPITAL_COMMUNITY)
Admission: RE | Admit: 2019-01-20 | Discharge: 2019-01-20 | Disposition: A | Discharge status: Home / Self Care | Payer: PRIVATE HEALTH INSURANCE | Source: Ambulatory Visit | Attending: Gastroenterology | Admitting: Gastroenterology

## 2019-01-20 ENCOUNTER — Other Ambulatory Visit: Payer: Self-pay

## 2019-01-20 DIAGNOSIS — R7989 Other specified abnormal findings of blood chemistry: Secondary | ICD-10-CM | POA: Insufficient documentation

## 2019-01-21 ENCOUNTER — Ambulatory Visit (HOSPITAL_COMMUNITY): Payer: PRIVATE HEALTH INSURANCE

## 2019-01-23 LAB — CBC WITH DIFFERENTIAL/PLATELET
Basophils Absolute: 0 10*3/uL (ref 0.0–0.2)
Basos: 1 %
EOS (ABSOLUTE): 0.2 10*3/uL (ref 0.0–0.4)
Eos: 4 %
Hematocrit: 33.1 % — ABNORMAL LOW (ref 34.0–46.6)
Hemoglobin: 10.7 g/dL — ABNORMAL LOW (ref 11.1–15.9)
Immature Grans (Abs): 0 10*3/uL (ref 0.0–0.1)
Immature Granulocytes: 0 %
Lymphocytes Absolute: 1.6 10*3/uL (ref 0.7–3.1)
Lymphs: 38 %
MCH: 29.8 pg (ref 26.6–33.0)
MCHC: 32.3 g/dL (ref 31.5–35.7)
MCV: 92 fL (ref 79–97)
Monocytes Absolute: 0.5 10*3/uL (ref 0.1–0.9)
Monocytes: 11 %
Neutrophils Absolute: 1.9 10*3/uL (ref 1.4–7.0)
Neutrophils: 46 %
Platelets: 217 10*3/uL (ref 150–450)
RBC: 3.59 x10E6/uL — ABNORMAL LOW (ref 3.77–5.28)
RDW: 12.6 % (ref 11.7–15.4)
WBC: 4.2 10*3/uL (ref 3.4–10.8)

## 2019-01-23 LAB — ANTI-SMOOTH MUSCLE AB BY IFA: Anti-Smooth Muscle Ab by IFA: <1:20 {titer}

## 2019-01-23 LAB — TISSUE TRANSGLUTAMINASE ABS,IGG,IGA
Tissue Transglut Ab: <2 U/mL (ref 0–5)
Transglutaminase IgA: <2 U/mL (ref 0–3)

## 2019-01-23 LAB — HEPATITIS B SURFACE ANTIGEN: Hepatitis B Surface Ag: NEGATIVE

## 2019-01-23 LAB — MITOCHONDRIAL ANTIBODIES: Mitochondrial Ab: <20 Units (ref 0.0–20.0)

## 2019-01-23 LAB — IGA: IgA/Immunoglobulin A, Serum: 194 mg/dL (ref 87–352)

## 2019-01-23 LAB — ALPHA-1-ANTITRYPSIN: A-1 Antitrypsin: 154 mg/dL (ref 101–187)

## 2019-01-23 LAB — HEPATITIS B SURFACE ANTIBODY,QUALITATIVE: Hep B Surface Ab, Qual: NONREACTIVE

## 2019-01-23 LAB — COMPREHENSIVE METABOLIC PANEL
ALT: 22 IU/L (ref 0–32)
AST: 26 IU/L (ref 0–40)
Albumin/Globulin Ratio: 2 (ref 1.2–2.2)
Albumin: 4.3 g/dL (ref 3.8–4.9)
Alkaline Phosphatase: 135 IU/L — ABNORMAL HIGH (ref 39–117)
BUN/Creatinine Ratio: 24 — ABNORMAL HIGH (ref 9–23)
BUN: 19 mg/dL (ref 6–24)
Bilirubin Total: 0.3 mg/dL (ref 0.0–1.2)
CO2: 23 mmol/L (ref 20–29)
Calcium: 9.7 mg/dL (ref 8.7–10.2)
Chloride: 105 mmol/L (ref 96–106)
Creatinine, Ser: 0.79 mg/dL (ref 0.57–1.00)
GFR calc Af Amer: 95 mL/min/{1.73_m2} (ref >59)
GFR calc non Af Amer: 82 mL/min/{1.73_m2} (ref >59)
Globulin, Total: 2.1 g/dL (ref 1.5–4.5)
Glucose: 119 mg/dL — ABNORMAL HIGH (ref 65–99)
Potassium: 4.8 mmol/L (ref 3.5–5.2)
Sodium: 143 mmol/L (ref 134–144)
Total Protein: 6.4 g/dL (ref 6.0–8.5)

## 2019-01-23 LAB — HEPATITIS A ANTIBODY, TOTAL: hep A Total Ab: NEGATIVE

## 2019-01-23 LAB — IRON AND TIBC
Iron Saturation: 16 % (ref 15–55)
Iron: 37 ug/dL (ref 27–159)
Total Iron Binding Capacity: 233 ug/dL — ABNORMAL LOW (ref 250–450)
UIBC: 196 ug/dL (ref 131–425)

## 2019-01-23 LAB — CERULOPLASMIN: Ceruloplasmin: 30.2 mg/dL (ref 19.0–39.0)

## 2019-01-23 LAB — HEPATITIS C ANTIBODY: Hep C Virus Ab: <0.1 s/co ratio (ref 0.0–0.9)

## 2019-01-23 LAB — ANA: Anti Nuclear Antibody (ANA): POSITIVE — AB

## 2019-01-23 LAB — PROTIME-INR
INR: 1 (ref 0.9–1.2)
Prothrombin Time: 10.6 s (ref 9.1–12.0)

## 2019-01-23 LAB — FERRITIN: Ferritin: 21 ng/mL (ref 15–150)

## 2019-01-24 ENCOUNTER — Other Ambulatory Visit: Payer: Self-pay | Admitting: Internal Medicine

## 2019-01-24 DIAGNOSIS — R748 Abnormal levels of other serum enzymes: Secondary | ICD-10-CM

## 2019-01-25 ENCOUNTER — Other Ambulatory Visit: Payer: Self-pay | Admitting: Gastroenterology

## 2019-01-25 ENCOUNTER — Other Ambulatory Visit (HOSPITAL_COMMUNITY): Payer: Self-pay

## 2019-01-25 DIAGNOSIS — R768 Other specified abnormal immunological findings in serum: Secondary | ICD-10-CM

## 2019-01-27 NOTE — Telephone Encounter (Signed)
Left message for Dr. Dagmar Hait nurse to call the office to confirm they received my fax regarding her insulin pump.

## 2019-01-28 NOTE — Telephone Encounter (Signed)
Received clearance back from patients PCP and patient has a sensor augmented insulin pump that will auto adjust. Confirm with upstair that patient will be able to wear during her procedure. Informed patient of above. Patient voiced understanding.

## 2019-01-31 ENCOUNTER — Ambulatory Visit (INDEPENDENT_AMBULATORY_CARE_PROVIDER_SITE_OTHER): Payer: PRIVATE HEALTH INSURANCE

## 2019-01-31 ENCOUNTER — Other Ambulatory Visit: Payer: Self-pay | Admitting: Gastroenterology

## 2019-01-31 DIAGNOSIS — Z1159 Encounter for screening for other viral diseases: Secondary | ICD-10-CM

## 2019-01-31 LAB — ANA: ANA Titer 1: NEGATIVE

## 2019-02-01 ENCOUNTER — Other Ambulatory Visit: Payer: Self-pay

## 2019-02-01 DIAGNOSIS — R7989 Other specified abnormal findings of blood chemistry: Secondary | ICD-10-CM

## 2019-02-01 LAB — SARS CORONAVIRUS 2 (TAT 6-24 HRS): SARS Coronavirus 2: NEGATIVE

## 2019-02-02 ENCOUNTER — Other Ambulatory Visit: Payer: Self-pay

## 2019-02-02 ENCOUNTER — Encounter: Payer: Self-pay | Admitting: Gastroenterology

## 2019-02-02 ENCOUNTER — Ambulatory Visit (AMBULATORY_SURGERY_CENTER): Payer: PRIVATE HEALTH INSURANCE | Admitting: Gastroenterology

## 2019-02-02 VITALS — BP 112/62 | HR 71 | Temp 98.3°F | Resp 16 | Ht 62.0 in | Wt 173.0 lb

## 2019-02-02 DIAGNOSIS — D509 Iron deficiency anemia, unspecified: Secondary | ICD-10-CM | POA: Diagnosis not present

## 2019-02-02 DIAGNOSIS — R7989 Other specified abnormal findings of blood chemistry: Secondary | ICD-10-CM | POA: Diagnosis not present

## 2019-02-02 MED ORDER — SODIUM CHLORIDE 0.9 % IV SOLN: 500.0000 mL | Freq: Once | INTRAVENOUS | Status: DC

## 2019-02-02 NOTE — Progress Notes (Signed)
Temp taken by [facility] VS taken by [place] 

## 2019-02-02 NOTE — Op Note (Signed)
[facility] Patient Name: Colleen Zuniga Procedure Date: 02/02/2019 7:50 AM MRN: [id number] Endoscopist: Milus Banister , MD Age: 58 Referring MD:  Date of Birth: [birth year] Gender: Female Account #: [id number] Procedure:                Colonoscopy Indications:              Screening for colorectal malignant neoplasm, also                            normocytic anemia (not iron deficient) Medicines:                Monitored Anesthesia Care Procedure:                Pre-Anesthesia Assessment:                           - Prior to the procedure, a History and Physical                            was performed, and patient medications and                            allergies were reviewed. The patient's tolerance of                            previous anesthesia was also reviewed. The risks                            and benefits of the procedure and the sedation                            options and risks were discussed with the patient.                            All questions were answered, and informed consent                            was obtained. Prior Anticoagulants: The patient has                            taken no previous anticoagulant or antiplatelet                            agents. ASA Grade Assessment: II - A patient with                            mild systemic disease. After reviewing the risks                            and benefits, the patient was deemed in                            satisfactory condition to undergo the procedure.  After obtaining informed consent, the colonoscope                            was passed under direct vision. Throughout the                            procedure, the patient's blood pressure, pulse, and                            oxygen saturations were monitored continuously. The                            Colonoscope was introduced through the anus and                            advanced to the the  cecum, identified by                            appendiceal orifice and ileocecal valve. The                            colonoscopy was performed without difficulty. The                            patient tolerated the procedure well. The quality                            of the bowel preparation was good. The ileocecal                            valve, appendiceal orifice, and rectum were                            photographed. Scope In: 8:34:26 AM Scope Out: 8:46:30 AM Scope Withdrawal Time: 0 hours 7 minutes 0 seconds  Total Procedure Duration: 0 hours 12 minutes 4 seconds  Findings:                 Small external hemorrhoids.                           The entire examined colon appeared normal on direct                            and retroflexion views. Complications:            No immediate complications. Estimated Blood Loss:     Estimated blood loss: none. Impression:               - Small external hemorrhoids                           - The entire examined colon is normal on direct and                            retroflexion views.                           -  No polyps or cancers. Recommendation:           - Patient has a contact number available for                            emergencies. The signs and symptoms of potential                            delayed complications were discussed with the                            patient. Return to normal activities tomorrow.                            Written discharge instructions were provided to the                            patient.                           - Resume previous diet.                           - Continue present medications.                           - Repeat colonoscopy in 10 years for screening.                            There is no need for colon cancer screening by any                            method (including stool testing) prior to then. Rachael Feeaniel P Jacobs, MD 02/02/2019 8:50:02 AM This report has been  signed electronically.

## 2019-02-02 NOTE — Patient Instructions (Signed)
You will need another colonoscopy in 10 years.  Thank-you for choosing [place] for your healthcare needs today.  YOU HAD AN ENDOSCOPIC PROCEDURE TODAY AT [facility]:   Refer to the procedure report that was given to you for any specific questions about what was found during the examination.  If the procedure report does not answer your questions, please call your gastroenterologist to clarify.  If you requested that your care partner not be given the details of your procedure findings, then the procedure report has been included in a sealed envelope for you to review at your convenience later.  YOU SHOULD EXPECT: Some feelings of bloating in the abdomen. Passage of more gas than usual.  Walking can help get rid of the air that was put into your GI tract during the procedure and reduce the bloating. If you had a lower endoscopy (such as a colonoscopy or flexible sigmoidoscopy) you may notice spotting of blood in your stool or on the toilet paper. If you underwent a bowel prep for your procedure, you may not have a normal bowel movement for a few days.  Please Note:  You might notice some irritation and congestion in your nose or some drainage.  This is from the oxygen used during your procedure.  There is no need for concern and it should clear up in a day or so.  SYMPTOMS TO REPORT IMMEDIATELY:   Following lower endoscopy (colonoscopy or flexible sigmoidoscopy):  Excessive amounts of blood in the stool  Significant tenderness or worsening of abdominal pains  Swelling of the abdomen that is new, acute  Fever of 100F or higher   For urgent or emergent issues, a gastroenterologist can be reached at any hour by calling [phone].   DIET:  We do recommend a small meal at first, but then you may proceed to your regular diet.  Drink plenty of fluids but you should avoid alcoholic beverages for 24 hours.  ACTIVITY:  You should plan to take it easy for the rest of today and you  should NOT DRIVE or use heavy machinery until tomorrow (because of the sedation medicines used during the test).    FOLLOW UP: Our staff will call the number listed on your records 48-72 hours following your procedure to check on you and address any questions or concerns that you may have regarding the information given to you following your procedure. If we do not reach you, we will leave a message.  We will attempt to reach you two times.  During this call, we will ask if you have developed any symptoms of COVID 19. If you develop any symptoms (ie: fever, flu-like symptoms, shortness of breath, cough etc.) before then, please call [phone].  If you test positive for Covid 19 in the 2 weeks post procedure, please call and report this information to [place].     SIGNATURES/CONFIDENTIALITY: You and/or your care partner have signed paperwork which will be entered into your electronic medical record.  These signatures attest to the fact that that the information above on your After Visit Summary has been reviewed and is understood.  Full responsibility of the confidentiality of this discharge information lies with you and/or your care-partner.

## 2019-02-02 NOTE — Progress Notes (Signed)
PT taken to PACU. Monitors in place. VSS. Report given to RN. 

## 2019-02-07 ENCOUNTER — Encounter: Payer: Self-pay | Admitting: Gastroenterology

## 2019-02-08 ENCOUNTER — Telehealth: Payer: Self-pay

## 2019-02-08 NOTE — Telephone Encounter (Signed)
  Follow up Call-  Call back number 02/02/2019  Post procedure Call Back phone  # [phone]  Permission to leave phone message Yes  Some recent data might be hidden     Patient questions:  Do you have a fever, pain , or abdominal swelling? No. Pain Score  0 *  Have you tolerated food without any problems? Yes.    Have you been able to return to your normal activities? Yes.    Do you have any questions about your discharge instructions: Diet   No. Medications  No. Follow up visit  No.  Do you have questions or concerns about your Care? No.  Actions: * If pain score is 4 or above: No action needed, pain <4.  Pt states that she is having nausea every day, but is eating and drinking fine and has had BMs every day.  Advised to call if it gets worse, she agreed.  1. Have you developed a fever since your procedure? no  2.   Have you had an respiratory symptoms (SOB or cough) since your procedure? no  3.   Have you tested positive for COVID 19 since your procedure no  4.   Have you had any family members/close contacts diagnosed with the COVID 19 since your procedure?  no   If yes to any of these questions please route to Joylene John, RN and Alphonsa Gin, Therapist, sports.

## 2019-02-09 ENCOUNTER — Telehealth: Payer: Self-pay | Admitting: Gastroenterology

## 2019-02-09 NOTE — Telephone Encounter (Signed)
The pt had colon on 11/25 and the day after the colon she developed nausea.  The nausea begins after eating and is gone within an hour.  She is having 1 bowel movement daily, but she says they "float".  No new medications. Please advise

## 2019-02-09 NOTE — Telephone Encounter (Signed)
Left message on machine to call back  

## 2019-02-09 NOTE — Telephone Encounter (Signed)
Pt reported that she has been experiencing nausea after eating post colonoscopy.

## 2019-02-11 MED ORDER — ONDANSETRON HCL 4 MG PO TABS: 4.0000 mg | ORAL_TABLET | Freq: Every day | ORAL | 3 refills | Status: DC | PRN

## 2019-02-11 NOTE — Telephone Encounter (Signed)
I am not sure why she developed nausea after the colonoscopy.  That is not a very common effect.  Possibly the disruption in her normal gut flora is contributing.  Ask how she is feeling today.  Offer Zofran 4 mg pills 1 pill once daily as needed for nausea dispense 30 with 3 refills.  Also recommend that she start taking a probiotic or yogurt such as activity a once daily for the next 2 weeks.

## 2019-02-11 NOTE — Telephone Encounter (Signed)
Pt states she is feeling some better but still having a little nausea. She will try the yogurt for 2 weeks and try the zofran. Pt thanked [place] for the call back.

## 2019-06-19 IMAGING — CT CT LUMBAR SPINE WITHOUT CONTRAST
3 of 4 series · 12 of 33 positions shown, 14 images · non-contrast
Comparison: MRI lumbar spine from 07/20/2018

CLINICAL DATA: Lumbar radiculopathy. Difficulty walking and sitting
with numbness and tingling down legs and calf pain on the left.

EXAM:
CT LUMBAR SPINE WITHOUT CONTRAST
TECHNIQUE: Multidetector CT imaging of the lumbar spine was performed without
intravenous contrast administration. Multiplanar CT image
reconstructions were also generated.

[Series 3: l-spine 2.00 br40 s3 lspine st · axial · 0.32mm/px · z∈[+1252,+1404]mm · 4 of 114 slices shown, 5 images]
[im 19/114  soft-tissue]
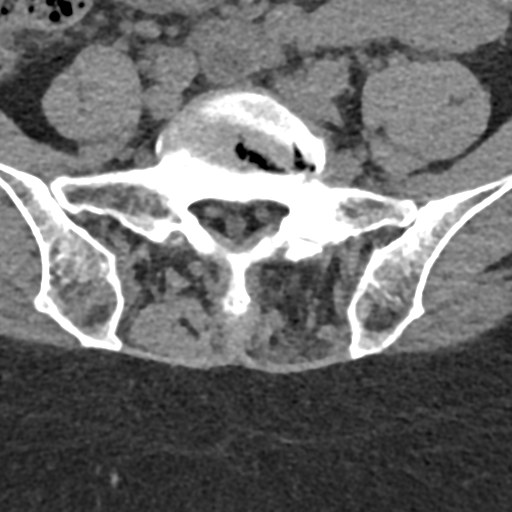
[im 19/114  bone]
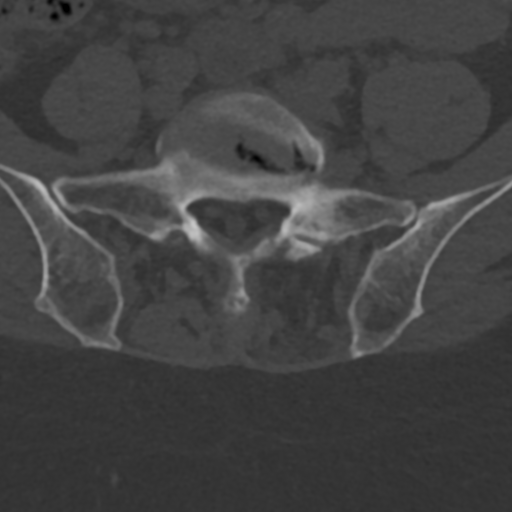
[im 38/114  bone]
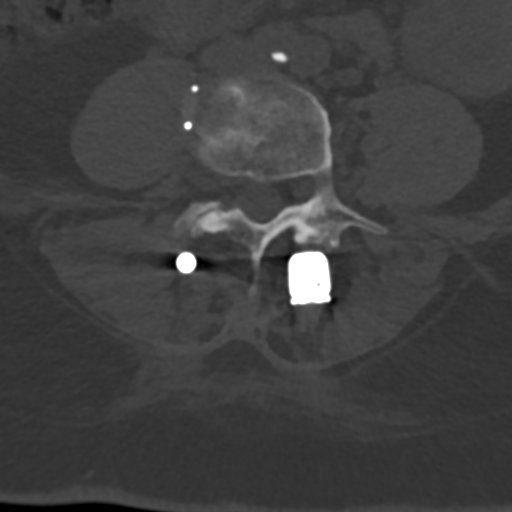
[im 76/114  bone]
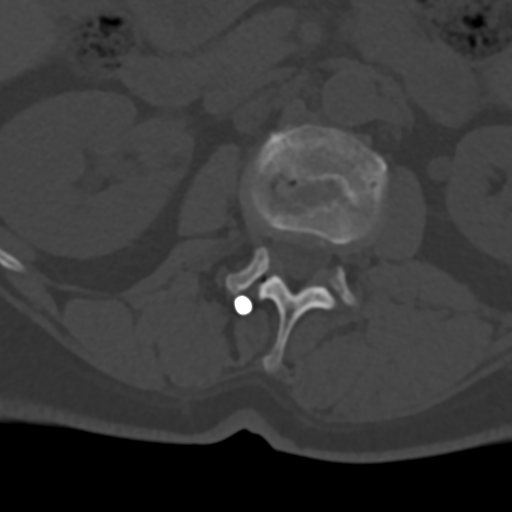
[im 95/114  bone]
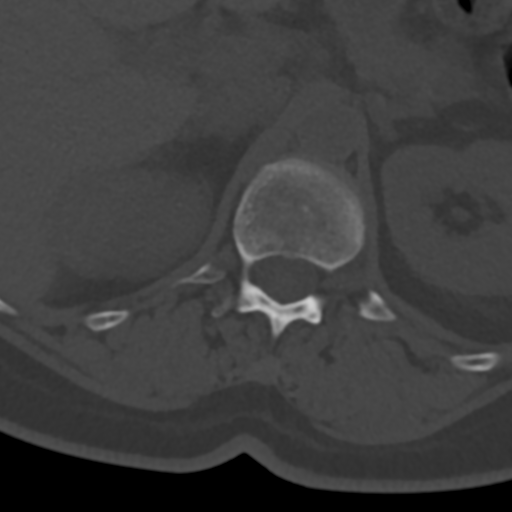

[Series 5: l-spine 2.00 br60 s3 sag sag bone · sagittal · 0.32mm/px · 5 of 71 slices shown, 6 images]
[im 24/71  bone]
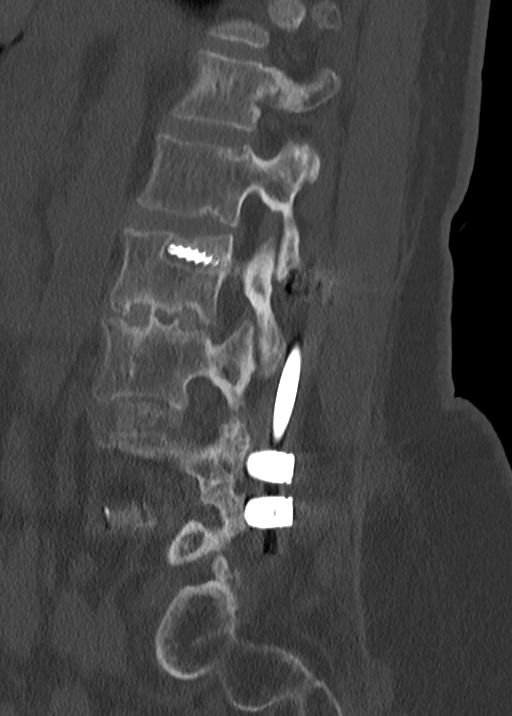
[im 30/71  bone]
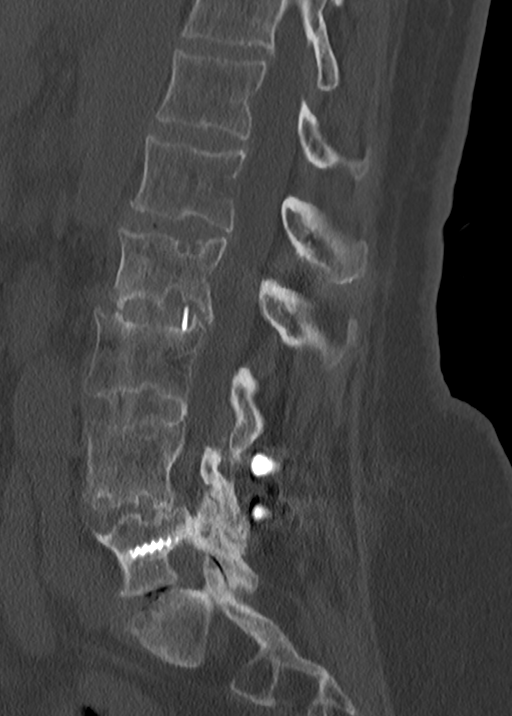
[im 36/71  soft-tissue]
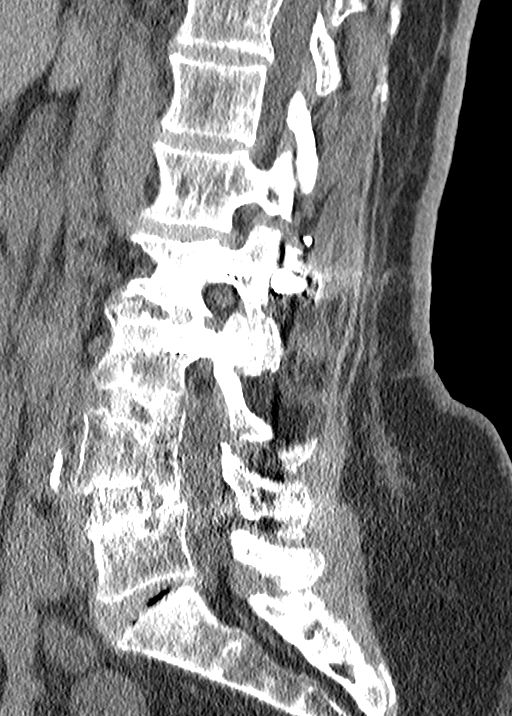
[im 36/71  bone]
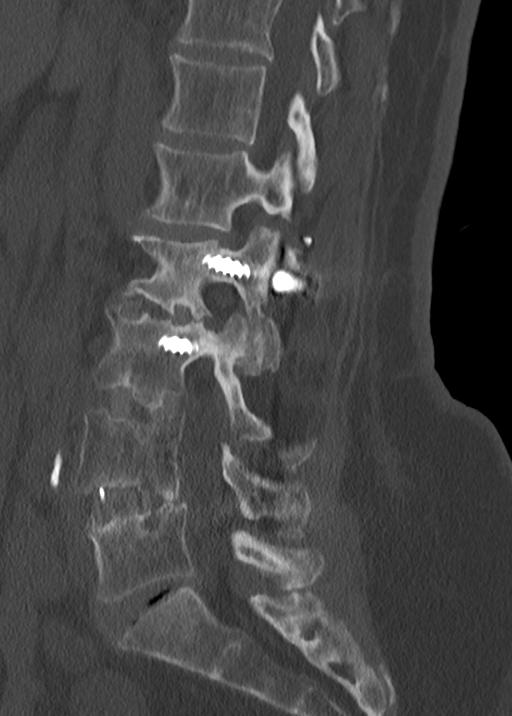
[im 41/71  bone]
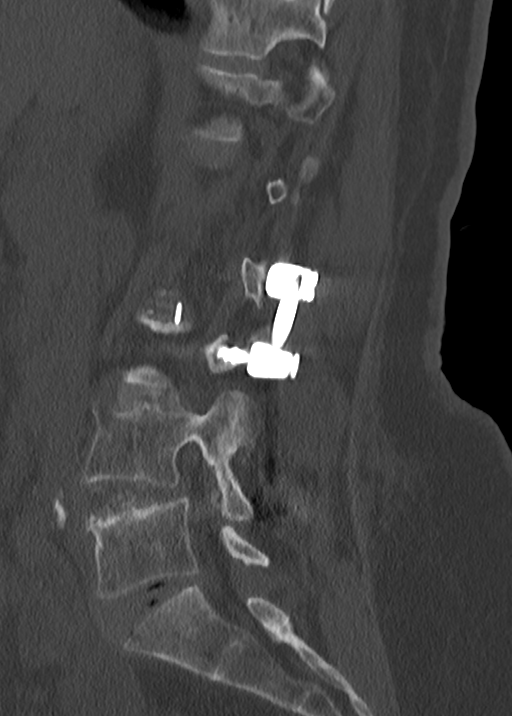
[im 47/71  bone]
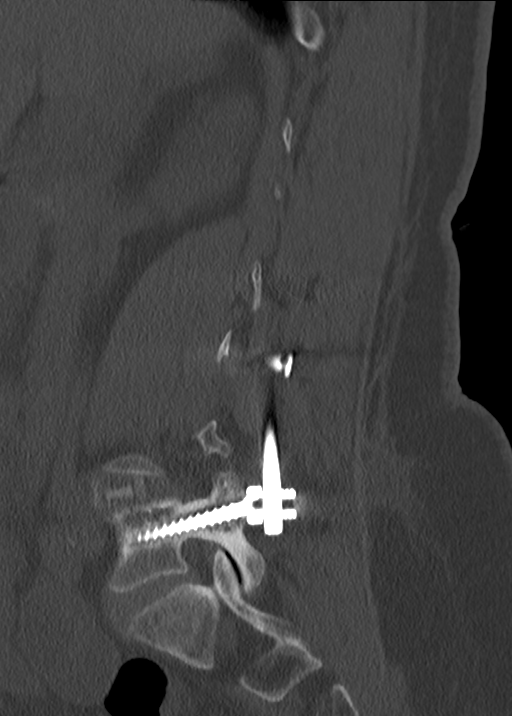

[Series 7: l-spine 2.00 br60 s3 cor cor bone · coronal · 0.28mm/px · 3 of 82 slices shown]
[im 17/82  bone]
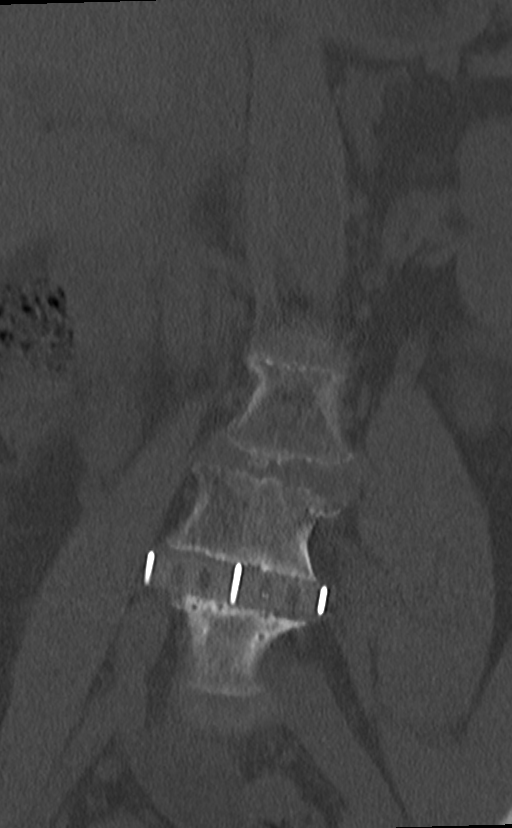
[im 33/82  bone]
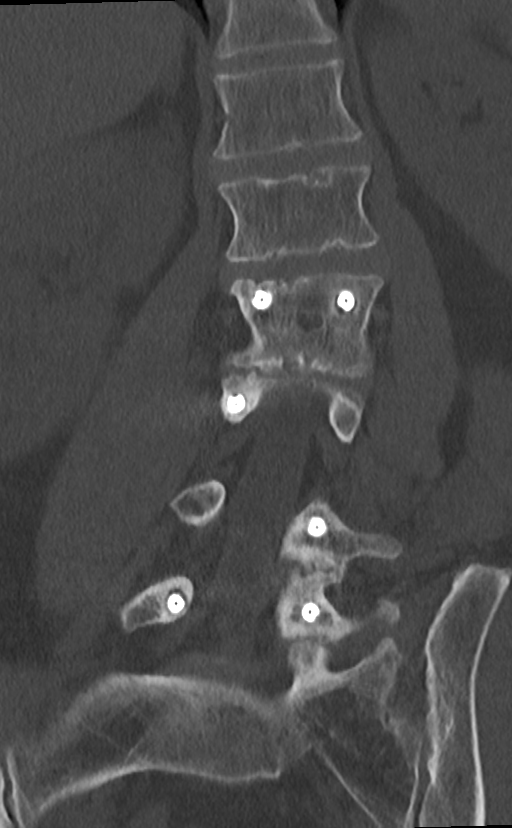
[im 49/82  bone]
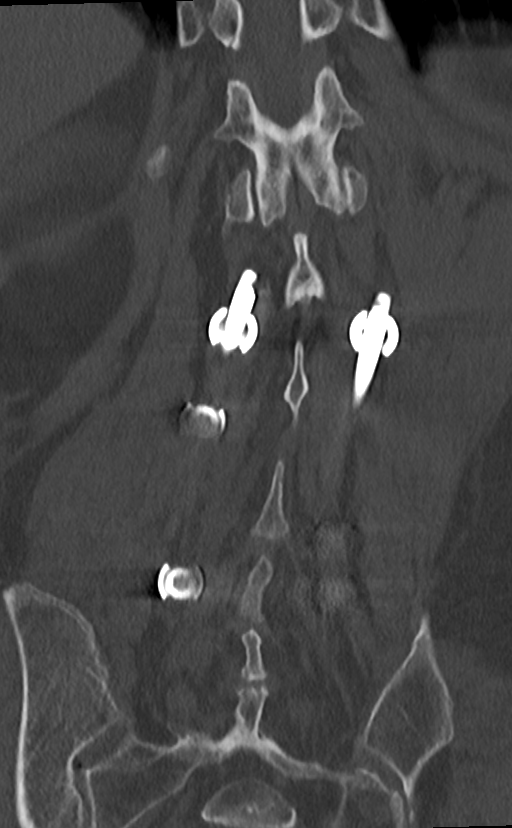

[12 of 33 positions shown; findings below may reference images not displayed]

FINDINGS: Segmentation: The lowest lumbar type non-rib-bearing vertebra is
labeled as L5.

Alignment: Levoconvex lumbar scoliosis with rotary component. 3 mm
degenerative anterolisthesis at L4-5.

Vertebrae: Referring back to yesterday's MRI, it appears that part
of the question on today's CT exam is to clear up whether there is
solid arthrodesis/bony bridging at L2-3 and L4-5.

At L2-3, interbody spacer is in place without a definite significant
degree of bridging bone. Its tip 8 a below as to whether there is a
tiny bit of bridging bone anteriorly on image 43/5 but this is not
thought to be biomechanically significant.

At L4-5, no definite bridging bone is identified.

L3-4 there is a small amount of posterior bridging bone.

Posterolateral rod and pedicle screw fixation in the lumbar spine
with pedicle screws on the right side at L2, L3, and L5; and on the
left side at L2, L4, and L5. The pedicle screws appear well
positioned, but there is lucency around the left L2 and right L5
pedicle screw which could be an indicator of loosening or infection.

Schmorl's nodes along the superior endplate of L2 and L1.

Paraspinal and other soft tissues: Aortoiliac atherosclerotic
vascular disease.

Disc levels: L1-2: Suspected disc bulge, borderline right
subarticular lateral recess stenosis.

L2-3: No impingement.

L3-4: No impingement.

L4-5: No impingement.

L5-S1: Suspected left foraminal stenosis due to left foraminal disc
protrusion.
IMPRESSION: 1. Accommodation of the interbody spacers at L2-3 and L4-5 but
without definite well-defined bony bridging to indicate solid
arthrodesis.
2. There is only minimal bony bridging posteriorly at the L3-4
level.
3. Mild left foraminal stenosis at L5-S1 due to disc protrusion.
Lucency around the left L2 and right L5 pedicle screws which could
be an indicator of loosening or infection.
4.  Aortic Atherosclerosis ([id number]-[id number]).
5. Levoconvex lumbar scoliosis with rotary component.

## 2019-06-21 ENCOUNTER — Other Ambulatory Visit (HOSPITAL_COMMUNITY): Payer: Self-pay | Admitting: Neurosurgery

## 2019-06-21 ENCOUNTER — Other Ambulatory Visit: Payer: Self-pay | Admitting: Neurosurgery

## 2019-06-21 DIAGNOSIS — M5416 Radiculopathy, lumbar region: Secondary | ICD-10-CM

## 2019-06-29 ENCOUNTER — Ambulatory Visit (HOSPITAL_COMMUNITY): Payer: PRIVATE HEALTH INSURANCE

## 2019-06-29 ENCOUNTER — Encounter (HOSPITAL_COMMUNITY): Payer: Self-pay

## 2019-07-01 ENCOUNTER — Ambulatory Visit (HOSPITAL_COMMUNITY)
Admission: RE | Admit: 2019-07-01 | Discharge: 2019-07-01 | Disposition: A | Discharge status: Home / Self Care | Payer: Medicare Other | Source: Ambulatory Visit | Attending: Neurosurgery | Admitting: Neurosurgery

## 2019-07-01 ENCOUNTER — Other Ambulatory Visit: Payer: Self-pay

## 2019-07-01 DIAGNOSIS — M5416 Radiculopathy, lumbar region: Secondary | ICD-10-CM | POA: Diagnosis present

## 2019-07-21 ENCOUNTER — Other Ambulatory Visit: Payer: Self-pay | Admitting: Neurosurgery

## 2019-07-26 ENCOUNTER — Other Ambulatory Visit: Payer: Self-pay | Admitting: Neurosurgery

## 2019-07-27 NOTE — Progress Notes (Signed)
[id number] [name], [place] - [address] [place] [address] Phone: [phone] Fax: [phone]  [facility] - [place], [place] - [address] AT Portal to [facility] [address] [place] [address] Phone: [phone] Fax: [phone]   Your procedure is scheduled on Friday, May 28th.  Report to [facility] Main Entrance "A" at 5:30 A.M., and check in at the Admitting office.  Call this number if you have problems the morning of surgery:  [phone]  Call [phone] if you have any questions prior to your surgery date Monday-Friday 8am-4pm   Remember:  Do not eat or drink after midnight the night before your surgery    Take these medicines the morning of surgery with A SIP OF WATER acetaminophen (TYLENOL)  buPROPion (WELLBUTRIN XL) fluticasone (FLONASE)/nasal spray gabapentin (NEURONTIN)  levocetirizine (XYZAL)  levothyroxine (SYNTHROID, LEVOTHROID)  If needed - metoCLOPramide (REGLAN), Propylene Glycol (SYSTANE COMPLETE)/eye drops,   albuterol (VENTOLIN HFA)/inhaler - bring with you the morning of surgery  As of today, STOP taking etodolac (LODINE), any Aspirin (unless otherwise instructed by your surgeon) and Aspirin containing products, Aleve, Naproxen, Ibuprofen, Motrin, Advil, Goody's, BC's, all herbal medications, fish oil, and all vitamins.          WHAT DO I DO ABOUT MY DIABETES MEDICATION?  Patients with Insulin Pumps . For patients with Insulin Pumps: o Contact your diabetes doctor for specific instructions before surgery. o Decrease basal insulin rates by 20% at midnight the night before surgery. o Note that if your surgery is planned to be longer than 2 hours, your insulin pump will be removed and intravenous (IV) insulin will be started and managed by the nurses and anesthesiologist. You will be able to restart your insulin pump once you are awake and able to manage it. o Make sure  to bring insulin pump supplies to the hospital with you in case your site needs to be changed.  HOW TO MANAGE YOUR DIABETES BEFORE AND AFTER SURGERY  Why is it important to control my blood sugar before and after surgery? . Improving blood sugar levels before and after surgery helps healing and can limit problems. . A way of improving blood sugar control is eating a healthy diet by: o  Eating less sugar and carbohydrates o  Increasing activity/exercise o  Talking with your doctor about reaching your blood sugar goals . High blood sugars (greater than 180 mg/dL) can raise your risk of infections and slow your recovery, so you will need to focus on controlling your diabetes during the weeks before surgery. . Make sure that the doctor who takes care of your diabetes knows about your planned surgery including the date and location.  How do I manage my blood sugar before surgery? . Check your blood sugar at least 4 times a day, starting 2 days before surgery, to make sure that the level is not too high or low. . Check your blood sugar the morning of your surgery when you wake up and every 2 hours until you get to the Short Stay unit. o If your blood sugar is less than 70 mg/dL, you will need to treat for low blood sugar: - Do not take insulin. - Treat a low blood sugar (less than 70 mg/dL) with  cup of clear juice (cranberry or apple), 4 glucose tablets, OR glucose gel. - Recheck blood sugar in 15 minutes after treatment (to make sure it is greater than 70  mg/dL). If your blood sugar is not greater than 70 mg/dL on recheck, call [phone] for further instructions. . Report your blood sugar to the short stay nurse when you get to Short Stay.  . If you are admitted to the hospital after surgery: o Your blood sugar will be checked by the staff and you will probably be given insulin after surgery (instead of oral diabetes medicines) to make sure you have good blood sugar levels. o The goal for  blood sugar control after surgery is 80-180 mg/dL.             Do not wear jewelry, make up, or nail polish            Do not wear lotions, powders, perfumes, or deodorant.            Do not shave 48 hours prior to surgery.                      Do not bring valuables to the hospital.            [facility] is not responsible for any belongings or valuables.  Do NOT Smoke (Tobacco/Vapping) or drink Alcohol 24 hours prior to your procedure If you use a CPAP at night, you may bring all equipment for your overnight stay.   Contacts, glasses, dentures or bridgework may not be worn into surgery.      For patients admitted to the hospital, discharge time will be determined by your treatment team.   Patients discharged the day of surgery will not be allowed to drive home, and someone needs to stay with them for 24 hours.  Special instructions:   [facility]- Preparing For Surgery  Before surgery, you can play an important role. Because skin is not sterile, your skin needs to be as free of germs as possible. You can reduce the number of germs on your skin by washing with CHG (chlorahexidine gluconate) Soap before surgery.  CHG is an antiseptic cleaner which kills germs and bonds with the skin to continue killing germs even after washing.    Oral Hygiene is also important to reduce your risk of infection.  Remember - BRUSH YOUR TEETH THE MORNING OF SURGERY WITH YOUR REGULAR TOOTHPASTE  Please do not use if you have an allergy to CHG or antibacterial soaps. If your skin becomes reddened/irritated stop using the CHG.  Do not shave (including legs and underarms) for at least 48 hours prior to first CHG shower. It is OK to shave your face.  Please follow these instructions carefully.   1. Shower the NIGHT BEFORE SURGERY and the MORNING OF SURGERY with CHG Soap.   2. If you chose to wash your hair, wash your hair first as usual with your normal shampoo.  3. After you shampoo, rinse your hair and  body thoroughly to remove the shampoo.  4. Use CHG as you would any other liquid soap. You can apply CHG directly to the skin and wash gently with a scrungie or a clean washcloth.   5. Apply the CHG Soap to your body ONLY FROM THE NECK DOWN.  Do not use on open wounds or open sores. Avoid contact with your eyes, ears, mouth and genitals (private parts). Wash Face and genitals (private parts)  with your normal soap.   6. Wash thoroughly, paying special attention to the area where your surgery will be performed.  7. Thoroughly rinse your body with warm water from  the neck down.  8. DO NOT shower/wash with your normal soap after using and rinsing off the CHG Soap.  9. Pat yourself dry with a CLEAN TOWEL.  10. Wear CLEAN PAJAMAS to bed the night before surgery, wear comfortable clothes the morning of surgery  11. Place CLEAN SHEETS on your bed the night of your first shower and DO NOT SLEEP WITH PETS.  Day of Surgery: Shower with CHG soap as instructed above.  Do not apply any deodorants/lotions.  Please wear clean clothes to the hospital/surgery center.   Remember to brush your teeth WITH YOUR REGULAR TOOTHPASTE.   Please read over the following fact sheets that you were given.

## 2019-07-28 ENCOUNTER — Encounter (HOSPITAL_COMMUNITY)
Admission: RE | Admit: 2019-07-28 | Discharge: 2019-07-28 | Disposition: A | Discharge status: Home / Self Care | Payer: Medicare Other | Source: Ambulatory Visit | Attending: Neurosurgery | Admitting: Neurosurgery

## 2019-07-28 ENCOUNTER — Encounter (HOSPITAL_COMMUNITY): Payer: Self-pay

## 2019-07-28 ENCOUNTER — Other Ambulatory Visit: Payer: Self-pay

## 2019-07-28 ENCOUNTER — Other Ambulatory Visit: Payer: Self-pay | Admitting: Neurosurgery

## 2019-07-28 DIAGNOSIS — E109 Type 1 diabetes mellitus without complications: Secondary | ICD-10-CM | POA: Insufficient documentation

## 2019-07-28 DIAGNOSIS — K219 Gastro-esophageal reflux disease without esophagitis: Secondary | ICD-10-CM | POA: Insufficient documentation

## 2019-07-28 DIAGNOSIS — Z79890 Hormone replacement therapy (postmenopausal): Secondary | ICD-10-CM | POA: Insufficient documentation

## 2019-07-28 DIAGNOSIS — Z794 Long term (current) use of insulin: Secondary | ICD-10-CM | POA: Insufficient documentation

## 2019-07-28 DIAGNOSIS — Z79899 Other long term (current) drug therapy: Secondary | ICD-10-CM | POA: Insufficient documentation

## 2019-07-28 DIAGNOSIS — Z9641 Presence of insulin pump (external) (internal): Secondary | ICD-10-CM | POA: Diagnosis not present

## 2019-07-28 DIAGNOSIS — Z87891 Personal history of nicotine dependence: Secondary | ICD-10-CM | POA: Diagnosis not present

## 2019-07-28 DIAGNOSIS — E039 Hypothyroidism, unspecified: Secondary | ICD-10-CM | POA: Insufficient documentation

## 2019-07-28 DIAGNOSIS — F329 Major depressive disorder, single episode, unspecified: Secondary | ICD-10-CM | POA: Diagnosis not present

## 2019-07-28 DIAGNOSIS — Z01818 Encounter for other preprocedural examination: Secondary | ICD-10-CM | POA: Insufficient documentation

## 2019-07-28 DIAGNOSIS — F419 Anxiety disorder, unspecified: Secondary | ICD-10-CM | POA: Insufficient documentation

## 2019-07-28 DIAGNOSIS — Z981 Arthrodesis status: Secondary | ICD-10-CM | POA: Insufficient documentation

## 2019-07-28 LAB — CBC
HCT: 35.3 % — ABNORMAL LOW (ref 36.0–46.0)
Hemoglobin: 10.7 g/dL — ABNORMAL LOW (ref 12.0–15.0)
MCH: 28.5 pg (ref 26.0–34.0)
MCHC: 30.3 g/dL (ref 30.0–36.0)
MCV: 93.9 fL (ref 80.0–100.0)
Platelets: 242 10*3/uL (ref 150–400)
RBC: 3.76 MIL/uL — ABNORMAL LOW (ref 3.87–5.11)
RDW: 12.5 % (ref 11.5–15.5)
WBC: 5.8 10*3/uL (ref 4.0–10.5)
nRBC: 0 % (ref 0.0–0.2)

## 2019-07-28 LAB — BASIC METABOLIC PANEL
Anion gap: 9 (ref 5–15)
BUN: 20 mg/dL (ref 6–20)
CO2: 25 mmol/L (ref 22–32)
Calcium: 9.9 mg/dL (ref 8.9–10.3)
Chloride: 105 mmol/L (ref 98–111)
Creatinine, Ser: 0.81 mg/dL (ref 0.44–1.00)
GFR calc Af Amer: >60 mL/min (ref >60)
GFR calc non Af Amer: >60 mL/min (ref >60)
Glucose, Bld: 217 mg/dL — ABNORMAL HIGH (ref 70–99)
Potassium: 5 mmol/L (ref 3.5–5.1)
Sodium: 139 mmol/L (ref 135–145)

## 2019-07-28 LAB — SURGICAL PCR SCREEN
MRSA, PCR: NEGATIVE
Staphylococcus aureus: NEGATIVE

## 2019-07-28 LAB — HEMOGLOBIN A1C
Hgb A1c MFr Bld: 6.8 % — ABNORMAL HIGH (ref 4.8–5.6)
Mean Plasma Glucose: 148.46 mg/dL

## 2019-07-28 LAB — GLUCOSE, CAPILLARY: Glucose-Capillary: 165 mg/dL — ABNORMAL HIGH (ref 70–99)

## 2019-07-28 NOTE — Progress Notes (Addendum)
PCP - Avva @ [facility], maintenance  -diabetes coordinator @ [employer] - none @ present, Has seen Dr. Leonides Grills in the past   Chest x-ray - na EKG - 07/28/19 Stress Test - na ECHO - 2003 Cardiac Cath - na  Sleep Study - na CPAP -   Fasting Blood Sugar - 111-123 Checks Blood Sugar _6____ times a day  Blood Thinner Instructions: na Aspirin Instructions:na  ERAS Protcol -na   COVID TEST- 08/02/19   Anesthesia review: diabetes/ insulin pump, Pt. To follow up with Edison Pace for insulin pump instructions prior to surgery. Also, pt. States Dr. Wynetta Emery is to add XLIF L1, L2 to consent. Left message for [facility] @ Dr. Lonie Peak office.  Notified Kendra, Diabetes coordinator of pt's surgery date and time  Patient denies shortness of breath, fever, cough and chest pain at PAT appointment   All instructions explained to the patient, with a verbal understanding of the material. Patient agrees to go over the instructions while at home for a better understanding. Patient also instructed to self quarantine after being tested for COVID-19. The opportunity to ask questions was provided.

## 2019-07-29 NOTE — Progress Notes (Signed)
Anesthesia Chart Review:  Case: [id number] Date/Time: 08/05/19 0715   Procedures:      Posterior lateral fusion - L5-S1 with pelvic fixation with iliac screws and Nuvasive instrumentation (N/A Back)     APPLICATION OF INTRAOPERATIVE CT SCAN (N/A )     L1-L2 XLIF-INTERBODY FUSION (N/A )   Anesthesia type: General   Pre-op diagnosis: pseudoarthrosis   Location: [facility] OR ROOM 18 / [facility] OR   Surgeons: Kary Kos, MD      DISCUSSION: Patient is a 59 year old female scheduled for the above procedure.  History includes former smoker (quit 1993), post-operative N/V, hypothyroidism, DM1 (on insulin pump), "functional murmur", dental crowns, ACDF (02/20/16), back surgery (L2-5 ALIF 08/18/16; left L5-S1 lumbar decompression including discectomy, left L5-S1 TLIF, bilateral L4-5 posterior lateral arthrodesis and bilateral L4-5 posterior lateral arthrodesis 08/04/18). BMI is consistent with obesity.  She has a continue glucose monitor DEVI and continuous insulin pump (Novology 100Unit/mL). PAT RN notified DM Coordinator.  Patient to follow-up with Tivis Ringer at [facility] regarding preoperative insulin pump instructions.  Preoperative H/H 10.7/35.3, but not really changed when compared to 01/18/19 labs--she had colonoscopy on 02/02/19 that showed small external hemorrhoids, normal colon on direct and retroflexion views, no polyps or cancers noted. T&S done with labs.    Presurgical COVID-19 test is scheduled for 08/02/2019.  Anesthesia team to evaluate on the day of surgery.   VS: BP 129/65   Pulse 67   Temp 36.7 C (Oral)   Resp 17   Ht 5\' 2"  (1.575 m)   Wt 88.9 kg   LMP  (LMP Unknown)   SpO2 100%   BMI 35.85 kg/m   PROVIDERS: Avva, Ravisankar, MD is PCP  She reported previous evaluation by cardioloigst "Dr. Cristine Polio" with an echo in 2003. She is not routinely followed by cardiology.   LABS: Labs reviewed: Acceptable for surgery. See DISCUSSION. (all labs ordered are listed, but only abnormal  results are displayed)  Labs Reviewed  GLUCOSE, CAPILLARY - Abnormal; Notable for the following components:      Result Value   Glucose-Capillary 165 (*)    All other components within normal limits  BASIC METABOLIC PANEL - Abnormal; Notable for the following components:   Glucose, Bld 217 (*)    All other components within normal limits  CBC - Abnormal; Notable for the following components:   RBC 3.76 (*)    Hemoglobin 10.7 (*)    HCT 35.3 (*)    All other components within normal limits  HEMOGLOBIN A1C - Abnormal; Notable for the following components:   Hgb A1c MFr Bld 6.8 (*)    All other components within normal limits  SURGICAL PCR SCREEN  TYPE AND SCREEN     IMAGES: CT L-spine 07/01/19: IMPRESSION: - Good appearance in the previous fusion segment from L2 through L5. 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 Interbody spacer shows endplate subsidence. Nitrogen gas in the disc space. Pronounced lucency around the S1 screws, with some erosion of the cortical margins of the S1 foramina, definite on the left and possible on the right. - Some degenerative changes also at the L1-2 level including bulging of the disc and facet osteoarthritis which could possibly contribute to pain.   EKG: 07/28/19: NSR   CV: Reported an echocardiogram in 2003. Report not currently available.    Past Medical History:  Diagnosis Date  . Anxiety   . Arthritis   . Back pain   . De Quervain's tenosynovitis, right 01/2012  .  Degenerative disc disease, cervical    states neck is stiff, reduced range of motion  . Dental crowns present   . Depression   . Diabetes mellitus    Insulin pump  . GERD (gastroesophageal reflux disease)   . Heart murmur    states has a functional murmur, and that she has never had any problems  . Hypothyroidism   . Joint pain   . PONV (postoperative nausea and vomiting)     Past Surgical History:  Procedure Laterality Date  . ANTERIOR CERVICAL DISCECTOMY  02/20/2016   . ANTERIOR LAT LUMBAR FUSION N/A 08/18/2016   Procedure: ANTERIOR LATERAL LUMBAR FUSION LUMBAR TWO- LUMBAR THREE, LUMBAR THREE- LUMBAR FOUR, LUMBAR FOUR- LUMBAR FIVE; LUMBAR TWO- LUMBAR FIVE PEDICLE SCREW FIXATION;  Surgeon: Shirlean Kelly, MD;  Location: [facility] OR;  Service: Neurosurgery;  Laterality: N/A;  ANTERIOR LATERAL LUMBAR FUSION LUMBAR 2- LUMBAR 3, LUMBAR 3- LUMBAR 4-, LUMBAR 4- LUMBAR 5; LUMBAR 2- LUMBAR 5 PEDICLE SCREW FIXATION  . BACK SURGERY  07/2018   plif l5-s1  . Cataracts Bilateral   . CESAREAN SECTION    . DORSAL COMPARTMENT RELEASE  02/08/2002   first dorsal compartment left wrist  . DORSAL COMPARTMENT RELEASE  02/03/2012   Procedure: RELEASE DORSAL COMPARTMENT (DEQUERVAIN);  Surgeon: Nicki Reaper, MD;  Location: [facility];  Service: Orthopedics;  Laterality: Right;  RELEASE DEQUERVAINS RIGHT WRIST  . KNEE ARTHROSCOPY  10/14/2004   right  . SHOULDER ARTHROSCOPY  05/05/2005   left  . TRIGGER FINGER RELEASE  01/07/2011   release A1 pulley left thumb  . TRIGGER FINGER RELEASE     x 5 other fingers    MEDICATIONS: . acetaminophen (TYLENOL) 325 MG tablet  . albuterol (VENTOLIN HFA) 108 (90 Base) MCG/ACT inhaler  . ALPRAZolam (XANAX) 0.5 MG tablet  . Ascorbic Acid (VITAMIN C WITH ROSE HIPS) 500 MG tablet  . atorvastatin (LIPITOR) 10 MG tablet  . BAQSIMI TWO PACK 3 MG/DOSE POWD  . buPROPion (WELLBUTRIN XL) 300 MG 24 hr tablet  . CALCIUM-VITAMIN D PO  . Continuous Glucose Monitor DEVI  . etodolac (LODINE) 500 MG tablet  . fluticasone (FLONASE) 50 MCG/ACT nasal spray  . gabapentin (NEURONTIN) 300 MG capsule  . insulin aspart (NOVOLOG) 100 UNIT/ML injection  . Insulin Human (INSULIN PUMP) SOLN  . irbesartan (AVAPRO) 300 MG tablet  . levocetirizine (XYZAL) 5 MG tablet  . levothyroxine (SYNTHROID, LEVOTHROID) 137 MCG tablet  . Magnesium 500 MG TABS  . metoCLOPramide (REGLAN) 10 MG tablet  . Multiple Vitamin (MULTIVITAMIN WITH MINERALS) TABS tablet  .  ondansetron (ZOFRAN) 4 MG tablet  . potassium gluconate 595 (99 K) MG TABS tablet  . Propylene Glycol (SYSTANE COMPLETE) 0.6 % SOLN   No current facility-administered medications for this encounter.     Shonna Chock, PA-C Surgical Short Stay/Anesthesiology [facility] Phone [phone] [facility] Phone [phone] 07/29/2019 6:47 PM

## 2019-07-29 NOTE — Anesthesia Preprocedure Evaluation (Addendum)
Anesthesia Evaluation  Patient identified by MRN, date of birth, ID band Patient awake    Reviewed: Allergy & Precautions, NPO status , Patient's Chart, lab work & pertinent test results  History of Anesthesia Complications (+) PONV and history of anesthetic complications  Airway Mallampati: I  TM Distance: >3 FB Neck ROM: Full    Dental  (+) Teeth Intact, Dental Advisory Given   Pulmonary neg pulmonary ROS, former smoker,    breath sounds clear to auscultation       Cardiovascular hypertension, Pt. on medications + Valvular Problems/Murmurs  Rhythm:Regular Rate:Normal     Neuro/Psych PSYCHIATRIC DISORDERS Anxiety Depression    GI/Hepatic Neg liver ROS, GERD  ,  Endo/Other  diabetes, Type 2, Insulin DependentHypothyroidism   Renal/GU      Musculoskeletal  (+) Arthritis ,   Abdominal Normal abdominal exam  (+)   Peds  Hematology negative hematology ROS (+)   Anesthesia Other Findings   Reproductive/Obstetrics                            Anesthesia Physical Anesthesia Plan  ASA: III  Anesthesia Plan: General   Post-op Pain Management:    Induction: Intravenous  PONV Risk Score and Plan: 4 or greater and Ondansetron, Midazolam, Scopolamine patch - Pre-op and Treatment may vary due to age or medical condition  Airway Management Planned: Oral ETT  Additional Equipment: Arterial line  Intra-op Plan:   Post-operative Plan: Extubation in OR  Informed Consent: I have reviewed the patients History and Physical, chart, labs and discussed the procedure including the risks, benefits and alternatives for the proposed anesthesia with the patient or authorized representative who has indicated his/her understanding and acceptance.     Dental advisory given  Plan Discussed with: CRNA  Anesthesia Plan Comments: (PAT note written 07/29/2019 by Shonna Chock, PA-C. )     Anesthesia  Quick Evaluation

## 2019-08-02 ENCOUNTER — Other Ambulatory Visit (HOSPITAL_COMMUNITY)
Admission: RE | Admit: 2019-08-02 | Discharge: 2019-08-02 | Disposition: A | Discharge status: Home / Self Care | Payer: Medicare Other | Source: Ambulatory Visit | Attending: Neurosurgery | Admitting: Neurosurgery

## 2019-08-02 DIAGNOSIS — Z20822 Contact with and (suspected) exposure to covid-19: Secondary | ICD-10-CM | POA: Insufficient documentation

## 2019-08-02 DIAGNOSIS — Z01812 Encounter for preprocedural laboratory examination: Secondary | ICD-10-CM | POA: Insufficient documentation

## 2019-08-02 LAB — SARS CORONAVIRUS 2 (TAT 6-24 HRS): SARS Coronavirus 2: NEGATIVE

## 2019-08-05 ENCOUNTER — Inpatient Hospital Stay (HOSPITAL_COMMUNITY)
Admission: RE | Admit: 2019-08-05 | Discharge: 2019-08-09 | DRG: 454 | Disposition: A | Discharge status: Home Health | Payer: Medicare Other | Attending: Neurosurgery | Admitting: Neurosurgery

## 2019-08-05 ENCOUNTER — Encounter (HOSPITAL_COMMUNITY): Payer: Self-pay | Admitting: Neurosurgery

## 2019-08-05 ENCOUNTER — Other Ambulatory Visit: Payer: Self-pay

## 2019-08-05 ENCOUNTER — Inpatient Hospital Stay (HOSPITAL_COMMUNITY): Payer: Medicare Other

## 2019-08-05 ENCOUNTER — Inpatient Hospital Stay (HOSPITAL_COMMUNITY): Payer: Medicare Other | Admitting: Anesthesiology

## 2019-08-05 ENCOUNTER — Inpatient Hospital Stay (HOSPITAL_COMMUNITY): Payer: Medicare Other | Admitting: Vascular Surgery

## 2019-08-05 ENCOUNTER — Encounter (HOSPITAL_COMMUNITY)
Admission: RE | Disposition: A | Discharge status: Home Health | Payer: Self-pay | Source: Home / Self Care | Attending: Neurosurgery

## 2019-08-05 DIAGNOSIS — Z79890 Hormone replacement therapy (postmenopausal): Secondary | ICD-10-CM | POA: Diagnosis not present

## 2019-08-05 DIAGNOSIS — K219 Gastro-esophageal reflux disease without esophagitis: Secondary | ICD-10-CM | POA: Diagnosis present

## 2019-08-05 DIAGNOSIS — Z800 Family history of malignant neoplasm of digestive organs: Secondary | ICD-10-CM | POA: Diagnosis not present

## 2019-08-05 DIAGNOSIS — Z823 Family history of stroke: Secondary | ICD-10-CM

## 2019-08-05 DIAGNOSIS — Z87891 Personal history of nicotine dependence: Secondary | ICD-10-CM | POA: Diagnosis not present

## 2019-08-05 DIAGNOSIS — M4856XA Collapsed vertebra, not elsewhere classified, lumbar region, initial encounter for fracture: Secondary | ICD-10-CM | POA: Diagnosis not present

## 2019-08-05 DIAGNOSIS — E039 Hypothyroidism, unspecified: Secondary | ICD-10-CM | POA: Diagnosis not present

## 2019-08-05 DIAGNOSIS — M438X6 Other specified deforming dorsopathies, lumbar region: Secondary | ICD-10-CM | POA: Diagnosis not present

## 2019-08-05 DIAGNOSIS — T84226A Displacement of internal fixation device of vertebrae, initial encounter: Secondary | ICD-10-CM | POA: Diagnosis present

## 2019-08-05 DIAGNOSIS — E119 Type 2 diabetes mellitus without complications: Secondary | ICD-10-CM | POA: Diagnosis present

## 2019-08-05 DIAGNOSIS — F329 Major depressive disorder, single episode, unspecified: Secondary | ICD-10-CM | POA: Diagnosis not present

## 2019-08-05 DIAGNOSIS — Z79899 Other long term (current) drug therapy: Secondary | ICD-10-CM | POA: Diagnosis not present

## 2019-08-05 DIAGNOSIS — R079 Chest pain, unspecified: Secondary | ICD-10-CM | POA: Diagnosis not present

## 2019-08-05 DIAGNOSIS — M48061 Spinal stenosis, lumbar region without neurogenic claudication: Secondary | ICD-10-CM

## 2019-08-05 DIAGNOSIS — Z419 Encounter for procedure for purposes other than remedying health state, unspecified: Secondary | ICD-10-CM

## 2019-08-05 DIAGNOSIS — Z20822 Contact with and (suspected) exposure to covid-19: Secondary | ICD-10-CM | POA: Diagnosis present

## 2019-08-05 DIAGNOSIS — Z885 Allergy status to narcotic agent status: Secondary | ICD-10-CM

## 2019-08-05 DIAGNOSIS — S32009K Unspecified fracture of unspecified lumbar vertebra, subsequent encounter for fracture with nonunion: Secondary | ICD-10-CM

## 2019-08-05 DIAGNOSIS — I10 Essential (primary) hypertension: Secondary | ICD-10-CM | POA: Diagnosis present

## 2019-08-05 DIAGNOSIS — D62 Acute posthemorrhagic anemia: Secondary | ICD-10-CM | POA: Diagnosis not present

## 2019-08-05 DIAGNOSIS — Z794 Long term (current) use of insulin: Secondary | ICD-10-CM

## 2019-08-05 DIAGNOSIS — E876 Hypokalemia: Secondary | ICD-10-CM | POA: Diagnosis present

## 2019-08-05 DIAGNOSIS — Z833 Family history of diabetes mellitus: Secondary | ICD-10-CM

## 2019-08-05 DIAGNOSIS — R002 Palpitations: Secondary | ICD-10-CM | POA: Diagnosis not present

## 2019-08-05 DIAGNOSIS — Z8249 Family history of ischemic heart disease and other diseases of the circulatory system: Secondary | ICD-10-CM

## 2019-08-05 DIAGNOSIS — Z880 Allergy status to penicillin: Secondary | ICD-10-CM

## 2019-08-05 DIAGNOSIS — M960 Pseudarthrosis after fusion or arthrodesis: Principal | ICD-10-CM | POA: Diagnosis present

## 2019-08-05 HISTORY — PX: ANTERIOR LAT LUMBAR FUSION: SHX1168

## 2019-08-05 HISTORY — PX: APPLICATION OF INTRAOPERATIVE CT SCAN: SHX6668

## 2019-08-05 HISTORY — PX: LAMINECTOMY WITH POSTERIOR LATERAL ARTHRODESIS LEVEL 1: SHX6335

## 2019-08-05 LAB — POCT I-STAT, CHEM 8
BUN: 16 mg/dL (ref 6–20)
BUN: 18 mg/dL (ref 6–20)
BUN: 21 mg/dL — ABNORMAL HIGH (ref 6–20)
BUN: 19 mg/dL (ref 6–20)
BUN: 20 mg/dL (ref 6–20)
Calcium, Ion: 1.27 mmol/L (ref 1.15–1.40)
Calcium, Ion: 1.29 mmol/L (ref 1.15–1.40)
Calcium, Ion: 1.3 mmol/L (ref 1.15–1.40)
Calcium, Ion: 1.33 mmol/L (ref 1.15–1.40)
Calcium, Ion: 1.34 mmol/L (ref 1.15–1.40)
Chloride: 106 mmol/L (ref 98–111)
Chloride: 107 mmol/L (ref 98–111)
Chloride: 107 mmol/L (ref 98–111)
Chloride: 106 mmol/L (ref 98–111)
Chloride: 106 mmol/L (ref 98–111)
Creatinine, Ser: 0.7 mg/dL (ref 0.44–1.00)
Creatinine, Ser: 0.6 mg/dL (ref 0.44–1.00)
Creatinine, Ser: 0.7 mg/dL (ref 0.44–1.00)
Creatinine, Ser: 0.6 mg/dL (ref 0.44–1.00)
Creatinine, Ser: 0.6 mg/dL (ref 0.44–1.00)
Glucose, Bld: 154 mg/dL — ABNORMAL HIGH (ref 70–99)
Glucose, Bld: 235 mg/dL — ABNORMAL HIGH (ref 70–99)
Glucose, Bld: 187 mg/dL — ABNORMAL HIGH (ref 70–99)
Glucose, Bld: 174 mg/dL — ABNORMAL HIGH (ref 70–99)
Glucose, Bld: 190 mg/dL — ABNORMAL HIGH (ref 70–99)
HCT: 28 % — ABNORMAL LOW (ref 36.0–46.0)
HCT: 26 % — ABNORMAL LOW (ref 36.0–46.0)
HCT: 25 % — ABNORMAL LOW (ref 36.0–46.0)
HCT: 24 % — ABNORMAL LOW (ref 36.0–46.0)
HCT: 25 % — ABNORMAL LOW (ref 36.0–46.0)
Hemoglobin: 9.5 g/dL — ABNORMAL LOW (ref 12.0–15.0)
Hemoglobin: 8.8 g/dL — ABNORMAL LOW (ref 12.0–15.0)
Hemoglobin: 8.5 g/dL — ABNORMAL LOW (ref 12.0–15.0)
Hemoglobin: 8.2 g/dL — ABNORMAL LOW (ref 12.0–15.0)
Hemoglobin: 8.5 g/dL — ABNORMAL LOW (ref 12.0–15.0)
Potassium: 4.1 mmol/L (ref 3.5–5.1)
Potassium: 4.8 mmol/L (ref 3.5–5.1)
Potassium: 4 mmol/L (ref 3.5–5.1)
Potassium: 4.2 mmol/L (ref 3.5–5.1)
Potassium: 4.3 mmol/L (ref 3.5–5.1)
Sodium: 140 mmol/L (ref 135–145)
Sodium: 138 mmol/L (ref 135–145)
Sodium: 141 mmol/L (ref 135–145)
Sodium: 140 mmol/L (ref 135–145)
Sodium: 141 mmol/L (ref 135–145)
TCO2: 26 mmol/L (ref 22–32)
TCO2: 26 mmol/L (ref 22–32)
TCO2: 24 mmol/L (ref 22–32)
TCO2: 24 mmol/L (ref 22–32)
TCO2: 25 mmol/L (ref 22–32)

## 2019-08-05 LAB — GLUCOSE, CAPILLARY
Glucose-Capillary: 142 mg/dL — ABNORMAL HIGH (ref 70–99)
Glucose-Capillary: 158 mg/dL — ABNORMAL HIGH (ref 70–99)
Glucose-Capillary: 187 mg/dL — ABNORMAL HIGH (ref 70–99)
Glucose-Capillary: 210 mg/dL — ABNORMAL HIGH (ref 70–99)
Glucose-Capillary: 148 mg/dL — ABNORMAL HIGH (ref 70–99)
Glucose-Capillary: 296 mg/dL — ABNORMAL HIGH (ref 70–99)

## 2019-08-05 LAB — PREPARE RBC (CROSSMATCH)

## 2019-08-05 SURGERY — LAMINECTOMY WITH POSTERIOR LATERAL ARTHRODESIS LEVEL 1
Anesthesia: General | Site: Spine Lumbar

## 2019-08-05 MED ORDER — DEXAMETHASONE SODIUM PHOSPHATE 10 MG/ML IJ SOLN
10.0000 mg | Freq: Once | INTRAMUSCULAR | Status: AC
  Administered 2019-08-05: 5 mg via INTRAVENOUS
  Filled 2019-08-05: qty 1

## 2019-08-05 MED ORDER — PROPOFOL 10 MG/ML IV BOLUS
INTRAVENOUS | Status: AC
  Filled 2019-08-05: qty 20

## 2019-08-05 MED ORDER — CHLORHEXIDINE GLUCONATE CLOTH 2 % EX PADS: 6.0000 | MEDICATED_PAD | Freq: Once | CUTANEOUS | Status: DC

## 2019-08-05 MED ORDER — 0.9 % SODIUM CHLORIDE (POUR BTL) OPTIME
TOPICAL | Status: DC | PRN
  Administered 2019-08-05 (×2): 1000 mL

## 2019-08-05 MED ORDER — POTASSIUM GLUCONATE 595 (99 K) MG PO TABS: 595.0000 mg | ORAL_TABLET | Freq: Every day | ORAL | Status: DC

## 2019-08-05 MED ORDER — ALUM & MAG HYDROXIDE-SIMETH 200-200-20 MG/5ML PO SUSP: 30.0000 mL | Freq: Four times a day (QID) | ORAL | Status: DC | PRN

## 2019-08-05 MED ORDER — ASCORBIC ACID 500 MG PO TABS
500.0000 mg | ORAL_TABLET | Freq: Every day | ORAL | Status: DC
  Administered 2019-08-06 – 2019-08-09 (×4): 500 mg via ORAL
  Filled 2019-08-05 (×4): qty 1

## 2019-08-05 MED ORDER — ALBUMIN HUMAN 5 % IV SOLN: INTRAVENOUS | Status: DC | PRN

## 2019-08-05 MED ORDER — FENTANYL CITRATE (PF) 100 MCG/2ML IJ SOLN
INTRAMUSCULAR | Status: DC | PRN
  Administered 2019-08-05 (×3): 25 ug via INTRAVENOUS
  Administered 2019-08-05 (×2): 50 ug via INTRAVENOUS
  Administered 2019-08-05: 25 ug via INTRAVENOUS
  Administered 2019-08-05: 50 ug via INTRAVENOUS

## 2019-08-05 MED ORDER — PROPOFOL 1000 MG/100ML IV EMUL
INTRAVENOUS | Status: AC
  Filled 2019-08-05: qty 100

## 2019-08-05 MED ORDER — LACTATED RINGERS IV SOLN: INTRAVENOUS | Status: DC

## 2019-08-05 MED ORDER — SODIUM CHLORIDE 0.9 % IV SOLN
INTRAVENOUS | Status: DC | PRN
  Administered 2019-08-05: 500 mL

## 2019-08-05 MED ORDER — PROPOFOL 10 MG/ML IV BOLUS
INTRAVENOUS | Status: DC | PRN
  Administered 2019-08-05: 120 mg via INTRAVENOUS

## 2019-08-05 MED ORDER — ROCURONIUM BROMIDE 10 MG/ML (PF) SYRINGE
PREFILLED_SYRINGE | INTRAVENOUS | Status: AC
  Filled 2019-08-05: qty 20

## 2019-08-05 MED ORDER — ACETAMINOPHEN 325 MG PO TABS
650.0000 mg | ORAL_TABLET | ORAL | Status: DC | PRN
  Administered 2019-08-06: 650 mg via ORAL
  Filled 2019-08-05: qty 2

## 2019-08-05 MED ORDER — INSULIN ASPART 100 UNIT/ML ~~LOC~~ SOLN: 0.0000 [IU] | Freq: Three times a day (TID) | SUBCUTANEOUS | Status: DC

## 2019-08-05 MED ORDER — CYCLOBENZAPRINE HCL 10 MG PO TABS
10.0000 mg | ORAL_TABLET | Freq: Three times a day (TID) | ORAL | Status: DC | PRN
  Administered 2019-08-06: 10 mg via ORAL
  Filled 2019-08-05 (×2): qty 1

## 2019-08-05 MED ORDER — KETAMINE HCL 50 MG/5ML IJ SOSY
PREFILLED_SYRINGE | INTRAMUSCULAR | Status: AC
  Filled 2019-08-05: qty 5

## 2019-08-05 MED ORDER — FENTANYL CITRATE (PF) 250 MCG/5ML IJ SOLN
INTRAMUSCULAR | Status: AC
  Filled 2019-08-05: qty 5

## 2019-08-05 MED ORDER — SODIUM CHLORIDE 0.9% FLUSH
3.0000 mL | Freq: Two times a day (BID) | INTRAVENOUS | Status: DC
  Administered 2019-08-05 – 2019-08-09 (×7): 3 mL via INTRAVENOUS

## 2019-08-05 MED ORDER — SODIUM CHLORIDE 0.9% FLUSH: 3.0000 mL | INTRAVENOUS | Status: DC | PRN

## 2019-08-05 MED ORDER — ACETAMINOPHEN 10 MG/ML IV SOLN: 1000.0000 mg | Freq: Once | INTRAVENOUS | Status: DC | PRN

## 2019-08-05 MED ORDER — ACETAMINOPHEN 325 MG PO TABS: 325.0000 mg | ORAL_TABLET | Freq: Once | ORAL | Status: DC | PRN

## 2019-08-05 MED ORDER — ONDANSETRON HCL 4 MG/2ML IJ SOLN: 4.0000 mg | Freq: Four times a day (QID) | INTRAMUSCULAR | Status: DC | PRN

## 2019-08-05 MED ORDER — VANCOMYCIN HCL IN DEXTROSE 1-5 GM/200ML-% IV SOLN
1000.0000 mg | Freq: Two times a day (BID) | INTRAVENOUS | Status: DC
  Administered 2019-08-05 – 2019-08-08 (×6): 1000 mg via INTRAVENOUS
  Filled 2019-08-05 (×6): qty 200

## 2019-08-05 MED ORDER — ETODOLAC 400 MG PO TABS
400.0000 mg | ORAL_TABLET | Freq: Every day | ORAL | Status: DC
  Administered 2019-08-05 – 2019-08-08 (×4): 400 mg via ORAL
  Filled 2019-08-05 (×4): qty 1

## 2019-08-05 MED ORDER — THROMBIN 20000 UNITS EX SOLR
CUTANEOUS | Status: AC
  Filled 2019-08-05: qty 20000

## 2019-08-05 MED ORDER — VANCOMYCIN HCL IN DEXTROSE 1-5 GM/200ML-% IV SOLN
1000.0000 mg | INTRAVENOUS | Status: AC
  Administered 2019-08-05: 1000 mg via INTRAVENOUS
  Filled 2019-08-05: qty 200

## 2019-08-05 MED ORDER — POLYVINYL ALCOHOL 1.4 % OP SOLN
2.0000 [drp] | Freq: Three times a day (TID) | OPHTHALMIC | Status: DC | PRN
  Filled 2019-08-05: qty 15

## 2019-08-05 MED ORDER — LIDOCAINE 2% (20 MG/ML) 5 ML SYRINGE
INTRAMUSCULAR | Status: AC
  Filled 2019-08-05: qty 5

## 2019-08-05 MED ORDER — ROCURONIUM BROMIDE 100 MG/10ML IV SOLN
INTRAVENOUS | Status: DC | PRN
  Administered 2019-08-05 (×2): 10 mg via INTRAVENOUS
  Administered 2019-08-05: 20 mg via INTRAVENOUS
  Administered 2019-08-05: 50 mg via INTRAVENOUS
  Administered 2019-08-05 (×2): 10 mg via INTRAVENOUS

## 2019-08-05 MED ORDER — BUPROPION HCL ER (XL) 150 MG PO TB24
300.0000 mg | ORAL_TABLET | Freq: Every day | ORAL | Status: DC
  Administered 2019-08-06 – 2019-08-09 (×4): 300 mg via ORAL
  Filled 2019-08-05 (×4): qty 2

## 2019-08-05 MED ORDER — KETAMINE HCL 10 MG/ML IJ SOLN
INTRAMUSCULAR | Status: DC | PRN
Start: 2019-08-05 — End: 2019-08-05
  Administered 2019-08-05 (×5): 10 mg via INTRAVENOUS

## 2019-08-05 MED ORDER — LACTATED RINGERS IV SOLN: INTRAVENOUS | Status: DC | PRN

## 2019-08-05 MED ORDER — OXYCODONE HCL 5 MG PO TABS
10.0000 mg | ORAL_TABLET | ORAL | Status: DC | PRN
  Administered 2019-08-06 – 2019-08-09 (×15): 10 mg via ORAL
  Filled 2019-08-05 (×16): qty 2

## 2019-08-05 MED ORDER — ACETAMINOPHEN 650 MG RE SUPP: 650.0000 mg | RECTAL | Status: DC | PRN

## 2019-08-05 MED ORDER — PHENYLEPHRINE 40 MCG/ML (10ML) SYRINGE FOR IV PUSH (FOR BLOOD PRESSURE SUPPORT)
PREFILLED_SYRINGE | INTRAVENOUS | Status: DC | PRN
  Administered 2019-08-05: 80 ug via INTRAVENOUS

## 2019-08-05 MED ORDER — PROMETHAZINE HCL 25 MG/ML IJ SOLN: 6.2500 mg | INTRAMUSCULAR | Status: DC | PRN

## 2019-08-05 MED ORDER — SUGAMMADEX SODIUM 200 MG/2ML IV SOLN
INTRAVENOUS | Status: DC | PRN
  Administered 2019-08-05: 200 mg via INTRAVENOUS

## 2019-08-05 MED ORDER — PANTOPRAZOLE SODIUM 40 MG IV SOLR
40.0000 mg | Freq: Every day | INTRAVENOUS | Status: DC
  Administered 2019-08-05 – 2019-08-06 (×2): 40 mg via INTRAVENOUS
  Filled 2019-08-05 (×2): qty 40

## 2019-08-05 MED ORDER — LIDOCAINE-EPINEPHRINE 1 %-1:100000 IJ SOLN
INTRAMUSCULAR | Status: AC
  Filled 2019-08-05: qty 1

## 2019-08-05 MED ORDER — GABAPENTIN 300 MG PO CAPS
300.0000 mg | ORAL_CAPSULE | Freq: Four times a day (QID) | ORAL | Status: DC
  Administered 2019-08-05 – 2019-08-09 (×14): 300 mg via ORAL
  Filled 2019-08-05 (×14): qty 1

## 2019-08-05 MED ORDER — SUCCINYLCHOLINE CHLORIDE 200 MG/10ML IV SOSY
PREFILLED_SYRINGE | INTRAVENOUS | Status: DC | PRN
  Administered 2019-08-05: 120 mg via INTRAVENOUS

## 2019-08-05 MED ORDER — ADULT MULTIVITAMIN W/MINERALS CH
1.0000 | ORAL_TABLET | Freq: Every day | ORAL | Status: DC
  Administered 2019-08-06 – 2019-08-09 (×3): 1 via ORAL
  Filled 2019-08-05 (×4): qty 1

## 2019-08-05 MED ORDER — LIDOCAINE 2% (20 MG/ML) 5 ML SYRINGE
INTRAMUSCULAR | Status: DC | PRN
  Administered 2019-08-05: 60 mg via INTRAVENOUS

## 2019-08-05 MED ORDER — ORAL CARE MOUTH RINSE: 15.0000 mL | Freq: Once | OROMUCOSAL | Status: AC

## 2019-08-05 MED ORDER — PHENYLEPHRINE HCL-NACL 10-0.9 MG/250ML-% IV SOLN
INTRAVENOUS | Status: DC | PRN
  Administered 2019-08-05: 30 ug/min via INTRAVENOUS
  Administered 2019-08-05: 20 ug/min via INTRAVENOUS

## 2019-08-05 MED ORDER — ALBUTEROL SULFATE (2.5 MG/3ML) 0.083% IN NEBU: 2.5000 mg | INHALATION_SOLUTION | RESPIRATORY_TRACT | Status: DC | PRN

## 2019-08-05 MED ORDER — HYDROMORPHONE HCL 1 MG/ML IJ SOLN: 0.2500 mg | INTRAMUSCULAR | Status: DC | PRN

## 2019-08-05 MED ORDER — HYDROMORPHONE HCL 1 MG/ML IJ SOLN
0.5000 mg | INTRAMUSCULAR | Status: DC | PRN
  Administered 2019-08-05: 0.5 mg via INTRAVENOUS
  Filled 2019-08-05: qty 1

## 2019-08-05 MED ORDER — CHLORHEXIDINE GLUCONATE 0.12 % MT SOLN
15.0000 mL | Freq: Once | OROMUCOSAL | Status: AC
  Administered 2019-08-05: 15 mL via OROMUCOSAL
  Filled 2019-08-05: qty 15

## 2019-08-05 MED ORDER — AMISULPRIDE (ANTIEMETIC) 5 MG/2ML IV SOLN
INTRAVENOUS | Status: AC
  Filled 2019-08-05: qty 2

## 2019-08-05 MED ORDER — ONDANSETRON HCL 4 MG/2ML IJ SOLN
INTRAMUSCULAR | Status: DC | PRN
  Administered 2019-08-05 (×2): 4 mg via INTRAVENOUS

## 2019-08-05 MED ORDER — LIDOCAINE-EPINEPHRINE 2 %-1:100000 IJ SOLN
INTRAMUSCULAR | Status: AC
  Filled 2019-08-05: qty 1

## 2019-08-05 MED ORDER — ACETAMINOPHEN 325 MG PO TABS
325.0000 mg | ORAL_TABLET | Freq: Three times a day (TID) | ORAL | Status: DC
  Administered 2019-08-05 – 2019-08-09 (×11): 325 mg via ORAL
  Filled 2019-08-05 (×11): qty 1

## 2019-08-05 MED ORDER — ATORVASTATIN CALCIUM 10 MG PO TABS
10.0000 mg | ORAL_TABLET | Freq: Every day | ORAL | Status: DC
  Administered 2019-08-06 – 2019-08-08 (×3): 10 mg via ORAL
  Filled 2019-08-05 (×3): qty 1

## 2019-08-05 MED ORDER — METOCLOPRAMIDE HCL 10 MG PO TABS: 10.0000 mg | ORAL_TABLET | Freq: Every day | ORAL | Status: DC | PRN

## 2019-08-05 MED ORDER — AMISULPRIDE (ANTIEMETIC) 5 MG/2ML IV SOLN
5.0000 mg | Freq: Once | INTRAVENOUS | Status: AC
  Administered 2019-08-05: 5 mg via INTRAVENOUS
  Filled 2019-08-05: qty 2

## 2019-08-05 MED ORDER — MEPERIDINE HCL 25 MG/ML IJ SOLN: 6.2500 mg | INTRAMUSCULAR | Status: DC | PRN

## 2019-08-05 MED ORDER — MIDAZOLAM HCL 2 MG/2ML IJ SOLN
INTRAMUSCULAR | Status: AC
  Filled 2019-08-05: qty 2

## 2019-08-05 MED ORDER — GLUCAGON 3 MG/DOSE NA POWD: 3.0000 mg | Freq: Once | NASAL | Status: DC

## 2019-08-05 MED ORDER — ALPRAZOLAM 0.25 MG PO TABS: 0.2500 mg | ORAL_TABLET | Freq: Every evening | ORAL | Status: DC | PRN

## 2019-08-05 MED ORDER — BUPIVACAINE LIPOSOME 1.3 % IJ SUSP
20.0000 mL | Freq: Once | INTRAMUSCULAR | Status: DC
  Filled 2019-08-05: qty 20

## 2019-08-05 MED ORDER — PHENOL 1.4 % MT LIQD: 1.0000 | OROMUCOSAL | Status: DC | PRN

## 2019-08-05 MED ORDER — ONDANSETRON HCL 4 MG/2ML IJ SOLN
INTRAMUSCULAR | Status: AC
  Filled 2019-08-05: qty 2

## 2019-08-05 MED ORDER — IRBESARTAN 300 MG PO TABS
300.0000 mg | ORAL_TABLET | Freq: Every day | ORAL | Status: DC
  Administered 2019-08-06 – 2019-08-09 (×4): 300 mg via ORAL
  Filled 2019-08-05 (×4): qty 1

## 2019-08-05 MED ORDER — FLUTICASONE PROPIONATE 50 MCG/ACT NA SUSP
2.0000 | Freq: Every day | NASAL | Status: DC
  Administered 2019-08-06 – 2019-08-09 (×3): 2 via NASAL
  Filled 2019-08-05: qty 16

## 2019-08-05 MED ORDER — THROMBIN 20000 UNITS EX SOLR
CUTANEOUS | Status: DC | PRN
  Administered 2019-08-05: 20 mL via TOPICAL

## 2019-08-05 MED ORDER — SUCCINYLCHOLINE CHLORIDE 200 MG/10ML IV SOSY
PREFILLED_SYRINGE | INTRAVENOUS | Status: AC
  Filled 2019-08-05: qty 10

## 2019-08-05 MED ORDER — MENTHOL 3 MG MT LOZG: 1.0000 | LOZENGE | OROMUCOSAL | Status: DC | PRN

## 2019-08-05 MED ORDER — MAGNESIUM OXIDE 400 (241.3 MG) MG PO TABS
400.0000 mg | ORAL_TABLET | Freq: Every day | ORAL | Status: DC
  Administered 2019-08-06 – 2019-08-09 (×4): 400 mg via ORAL
  Filled 2019-08-05 (×4): qty 1

## 2019-08-05 MED ORDER — PROPOFOL 500 MG/50ML IV EMUL
INTRAVENOUS | Status: DC | PRN
  Administered 2019-08-05: 50 ug/kg/min via INTRAVENOUS
  Administered 2019-08-05: 100 ug/kg/min via INTRAVENOUS

## 2019-08-05 MED ORDER — BUPIVACAINE LIPOSOME 1.3 % IJ SUSP
INTRAMUSCULAR | Status: DC | PRN
  Administered 2019-08-05: 20 mL

## 2019-08-05 MED ORDER — INSULIN ASPART 100 UNIT/ML ~~LOC~~ SOLN
10.0000 [IU] | SUBCUTANEOUS | Status: AC
  Administered 2019-08-05 (×3): 5 [IU] via SUBCUTANEOUS
  Filled 2019-08-05 (×2): qty 0.1

## 2019-08-05 MED ORDER — LEVOTHYROXINE SODIUM 25 MCG PO TABS
137.0000 ug | ORAL_TABLET | Freq: Every day | ORAL | Status: DC
  Administered 2019-08-06 – 2019-08-09 (×4): 137 ug via ORAL
  Filled 2019-08-05 (×4): qty 1

## 2019-08-05 MED ORDER — INSULIN PUMP
Freq: Three times a day (TID) | SUBCUTANEOUS | Status: DC
  Administered 2019-08-05: 3 via SUBCUTANEOUS
  Filled 2019-08-05: qty 1

## 2019-08-05 MED ORDER — THROMBIN 5000 UNITS EX SOLR
OROMUCOSAL | Status: DC | PRN
  Administered 2019-08-05: 5 mL via TOPICAL

## 2019-08-05 MED ORDER — MIDAZOLAM HCL 5 MG/5ML IJ SOLN
INTRAMUSCULAR | Status: DC | PRN
  Administered 2019-08-05: 2 mg via INTRAVENOUS

## 2019-08-05 MED ORDER — EPHEDRINE SULFATE-NACL 50-0.9 MG/10ML-% IV SOSY
PREFILLED_SYRINGE | INTRAVENOUS | Status: DC | PRN
  Administered 2019-08-05: 5 mg via INTRAVENOUS

## 2019-08-05 MED ORDER — ACETAMINOPHEN 160 MG/5ML PO SOLN: 325.0000 mg | Freq: Once | ORAL | Status: DC | PRN

## 2019-08-05 MED ORDER — CONTINUOUS GLUCOSE MONITOR DEVI: 1.0000 | Status: DC

## 2019-08-05 MED ORDER — THROMBIN 5000 UNITS EX SOLR
CUTANEOUS | Status: AC
  Filled 2019-08-05: qty 5000

## 2019-08-05 MED ORDER — LORATADINE 10 MG PO TABS
5.0000 mg | ORAL_TABLET | Freq: Every day | ORAL | Status: DC
  Administered 2019-08-06 – 2019-08-09 (×4): 5 mg via ORAL
  Filled 2019-08-05 (×4): qty 1

## 2019-08-05 MED ORDER — INSULIN PUMP: SUBCUTANEOUS | Status: DC

## 2019-08-05 MED ORDER — BUPIVACAINE HCL (PF) 0.25 % IJ SOLN
INTRAMUSCULAR | Status: AC
  Filled 2019-08-05: qty 30

## 2019-08-05 MED ORDER — SODIUM CHLORIDE 0.9 % IV SOLN: 250.0000 mL | INTRAVENOUS | Status: DC

## 2019-08-05 MED ORDER — LIDOCAINE-EPINEPHRINE 1 %-1:100000 IJ SOLN
INTRAMUSCULAR | Status: DC | PRN
  Administered 2019-08-05: 10 mL

## 2019-08-05 MED ORDER — ONDANSETRON HCL 4 MG PO TABS
4.0000 mg | ORAL_TABLET | Freq: Four times a day (QID) | ORAL | Status: DC | PRN
  Administered 2019-08-09: 4 mg via ORAL
  Filled 2019-08-05: qty 1

## 2019-08-05 MED ORDER — SODIUM CHLORIDE 0.9 % IV SOLN: 10.0000 mL/h | Freq: Once | INTRAVENOUS | Status: DC

## 2019-08-05 SURGICAL SUPPLY — 106 items
ADH SKN CLS APL DERMABOND .7 (GAUZE/BANDAGES/DRESSINGS) ×2
APL SKNCLS STERI-STRIP NONHPOA (GAUZE/BANDAGES/DRESSINGS) ×1
BAG DECANTER FOR FLEXI CONT (MISCELLANEOUS) ×4 IMPLANT
BATTALION LLIF ITRADISCAL SHIM (MISCELLANEOUS) ×3
BENZOIN TINCTURE PRP APPL 2/3 (GAUZE/BANDAGES/DRESSINGS) ×5 IMPLANT
BLADE CLIPPER SURG (BLADE) IMPLANT
BLADE SURG 11 STRL SS (BLADE) ×1 IMPLANT
BONE MATRIX OSTEOCEL PRO LRG (Bone Implant) ×2 IMPLANT
BONE MATRIX OSTEOCEL PRO MED (Bone Implant) ×2 IMPLANT
BONE VIVIGEN FORMABLE 10CC (Bone Implant) ×3 IMPLANT
BUR MATCHSTICK NEURO 3.0 LAGG (BURR) ×3 IMPLANT
BUR PRECISION FLUTE 6.0 (BURR) ×3 IMPLANT
CANISTER SUCT 3000ML PPV (MISCELLANEOUS) ×3 IMPLANT
CARTRIDGE OIL MAESTRO DRILL (MISCELLANEOUS) ×2 IMPLANT
CLIP SPRING STIM LLIF SAFEOP (CLIP) ×2 IMPLANT
CLOSURE WOUND 1/2 X4 (GAUZE/BANDAGES/DRESSINGS) ×1
CNTNR URN SCR LID CUP LEK RST (MISCELLANEOUS) ×1 IMPLANT
CONNECTOR RELINE 20MM OPEN OFF (Connector) ×2 IMPLANT
CONNECTOR RELINE 30MM OPEN OFF (Connector) ×2 IMPLANT
CONT SPEC 4OZ STRL OR WHT (MISCELLANEOUS) ×3
COVER BACK TABLE 24X17X13 BIG (DRAPES) IMPLANT
COVER BACK TABLE 60X90IN (DRAPES) ×6 IMPLANT
COVER WAND RF STERILE (DRAPES) ×2 IMPLANT
DECANTER SPIKE VIAL GLASS SM (MISCELLANEOUS) ×3 IMPLANT
DERMABOND ADVANCED (GAUZE/BANDAGES/DRESSINGS) ×4
DERMABOND ADVANCED .7 DNX12 (GAUZE/BANDAGES/DRESSINGS) ×3 IMPLANT
DIFFUSER DRILL AIR PNEUMATIC (MISCELLANEOUS) ×4 IMPLANT
DIGITIZER BENDINI (MISCELLANEOUS) ×2 IMPLANT
DILATOR INSULATED LLIF 8-13-18 (NEUROSURGERY SUPPLIES) ×2 IMPLANT
DRAPE C-ARM 42X72 X-RAY (DRAPES) ×7 IMPLANT
DRAPE C-ARMOR (DRAPES) ×5 IMPLANT
DRAPE HALF SHEET 40X57 (DRAPES) IMPLANT
DRAPE LAPAROTOMY 100X72X124 (DRAPES) ×6 IMPLANT
DRAPE POUCH INSTRU U-SHP 10X18 (DRAPES) ×1 IMPLANT
DRAPE SCAN PATIENT (DRAPES) ×3 IMPLANT
DRAPE SURG 17X23 STRL (DRAPES) ×7 IMPLANT
DRSG OPSITE 4X5.5 SM (GAUZE/BANDAGES/DRESSINGS) ×2 IMPLANT
DRSG OPSITE POSTOP 3X4 (GAUZE/BANDAGES/DRESSINGS) ×4 IMPLANT
DRSG OPSITE POSTOP 4X12 (GAUZE/BANDAGES/DRESSINGS) ×2 IMPLANT
DURAPREP 26ML APPLICATOR (WOUND CARE) ×5 IMPLANT
ELECT KIT SAFEOP EMG/NMJ (KITS) ×3
ELECT REM PT RETURN 9FT ADLT (ELECTROSURGICAL) ×6
ELECTRODE REM PT RTRN 9FT ADLT (ELECTROSURGICAL) ×2 IMPLANT
EVACUATOR 3/16  PVC DRAIN (DRAIN) ×3
EVACUATOR 3/16 PVC DRAIN (DRAIN) ×1 IMPLANT
GAUZE 4X4 16PLY RFD (DISPOSABLE) IMPLANT
GAUZE SPONGE 4X4 12PLY STRL (GAUZE/BANDAGES/DRESSINGS) ×1 IMPLANT
GAUZE SPONGE 4X4 16PLY XRAY LF (GAUZE/BANDAGES/DRESSINGS) ×2 IMPLANT
GLOVE BIO SURGEON STRL SZ7 (GLOVE) ×4 IMPLANT
GLOVE BIO SURGEON STRL SZ8 (GLOVE) ×10 IMPLANT
GLOVE BIOGEL PI IND STRL 6.5 (GLOVE) IMPLANT
GLOVE BIOGEL PI IND STRL 7.0 (GLOVE) IMPLANT
GLOVE BIOGEL PI IND STRL 7.5 (GLOVE) IMPLANT
GLOVE BIOGEL PI INDICATOR 6.5 (GLOVE) ×4
GLOVE BIOGEL PI INDICATOR 7.0 (GLOVE) ×6
GLOVE BIOGEL PI INDICATOR 7.5 (GLOVE) ×4
GLOVE ECLIPSE 7.0 STRL STRAW (GLOVE) ×2 IMPLANT
GLOVE EXAM NITRILE XL STR (GLOVE) IMPLANT
GLOVE INDICATOR 8.5 STRL (GLOVE) ×10 IMPLANT
GLOVE SURG SS PI 6.0 STRL IVOR (GLOVE) ×8 IMPLANT
GLOVE SURG SS PI 7.0 STRL IVOR (GLOVE) ×2 IMPLANT
GOWN STRL REUS W/ TWL LRG LVL3 (GOWN DISPOSABLE) IMPLANT
GOWN STRL REUS W/ TWL XL LVL3 (GOWN DISPOSABLE) ×3 IMPLANT
GOWN STRL REUS W/TWL 2XL LVL3 (GOWN DISPOSABLE) IMPLANT
GOWN STRL REUS W/TWL LRG LVL3 (GOWN DISPOSABLE) ×21
GOWN STRL REUS W/TWL XL LVL3 (GOWN DISPOSABLE) ×9
GRAFT BNE MATRIX VG FRMBL L 10 (Bone Implant) IMPLANT
HEMOSTAT POWDER KIT SURGIFOAM (HEMOSTASIS) ×3 IMPLANT
K-WIRE TROCAR TIP 320 (WIRE) ×3
KIT BASIN OR (CUSTOM PROCEDURE TRAY) ×6 IMPLANT
KIT EMG SRFC ELECT (KITS) IMPLANT
KIT INFUSE MEDIUM (Orthopedic Implant) ×2 IMPLANT
KIT TURNOVER KIT B (KITS) ×4 IMPLANT
KNIFE ANNULOTOMY RETRAC BLK (BLADE) ×2 IMPLANT
KWIRE TROCAR TIP 320 (WIRE) IMPLANT
LIF ILLUMINATION SYSTEM STERIL (SYSTAGENIX WOUND MANAGEMENT) ×3
MARKER SPHERE PSV REFLC 13MM (MARKER) ×41 IMPLANT
NDL HYPO 21X1.5 SAFETY (NEEDLE) IMPLANT
NDL HYPO 25X1 1.5 SAFETY (NEEDLE) ×2 IMPLANT
NEEDLE HYPO 21X1.5 SAFETY (NEEDLE) ×3 IMPLANT
NEEDLE HYPO 25X1 1.5 SAFETY (NEEDLE) ×6 IMPLANT
NS IRRIG 1000ML POUR BTL (IV SOLUTION) ×6 IMPLANT
OIL CARTRIDGE MAESTRO DRILL (MISCELLANEOUS) ×3
PACK LAMINECTOMY NEURO (CUSTOM PROCEDURE TRAY) ×6 IMPLANT
PAD ARMBOARD 7.5X6 YLW CONV (MISCELLANEOUS) ×21 IMPLANT
PROBE BALL TIP LLIF SAFEOP (NEUROSURGERY SUPPLIES) ×2 IMPLANT
ROD RELINE-O 5.5X300 STRT NS (Rod) IMPLANT
ROD RELINE-O 5.5X300MM STRT (Rod) ×6 IMPLANT
SCREW LOCK RELINE 5.5 TULIP (Screw) ×28 IMPLANT
SCREW RELINE-O POLY 5.5X40 (Screw) ×4 IMPLANT
SCREW RELINE-O POLY 8.5C70MM (Screw) ×4 IMPLANT
SHIM ITRADISCAL BATTALION LLIF (MISCELLANEOUS) IMPLANT
SPACER IDENTI LIF 8X16X40 0D (Spacer) ×2 IMPLANT
SPONGE LAP 4X18 RFD (DISPOSABLE) IMPLANT
SPONGE SURGIFOAM ABS GEL 100 (HEMOSTASIS) ×3 IMPLANT
STRIP CLOSURE SKIN 1/2X4 (GAUZE/BANDAGES/DRESSINGS) ×4 IMPLANT
SUT VIC AB 0 CT1 18XCR BRD8 (SUTURE) ×2 IMPLANT
SUT VIC AB 0 CT1 8-18 (SUTURE) ×6
SUT VIC AB 2-0 CT1 18 (SUTURE) ×6 IMPLANT
SUT VICRYL 4-0 PS2 18IN ABS (SUTURE) ×9 IMPLANT
SYR 20ML LL LF (SYRINGE) ×2 IMPLANT
SYSTEM ILLUMINATION LIF STERIL (SYSTAGENIX WOUND MANAGEMENT) IMPLANT
TOWEL GREEN STERILE (TOWEL DISPOSABLE) ×4 IMPLANT
TOWEL GREEN STERILE FF (TOWEL DISPOSABLE) ×4 IMPLANT
TRAY FOLEY MTR SLVR 16FR STAT (SET/KITS/TRAYS/PACK) ×4 IMPLANT
WATER STERILE IRR 1000ML POUR (IV SOLUTION) ×6 IMPLANT

## 2019-08-05 NOTE — Anesthesia Procedure Notes (Signed)
Procedure Name: Intubation Date/Time: 08/05/2019 7:49 AM Performed by: Marny Lowenstein, CRNA Pre-anesthesia Checklist: Patient identified, Emergency Drugs available, Suction available and Patient being monitored Patient Re-evaluated:Patient Re-evaluated prior to induction Oxygen Delivery Method: Circle system utilized Preoxygenation: Pre-oxygenation with 100% oxygen Induction Type: IV induction Ventilation: Mask ventilation without difficulty Laryngoscope Size: Glidescope and 3 Grade View: Grade I Tube type: Oral Tube size: 7.0 mm Number of attempts: 1 Airway Equipment and Method: Rigid stylet Placement Confirmation: ETT inserted through vocal cords under direct vision,  positive ETCO2 and breath sounds checked- equal and bilateral Secured at: 21 cm Tube secured with: Tape Dental Injury: Teeth and Oropharynx as per pre-operative assessment  Comments: Pt states she as a "stiff neck" so opted for glidescope intubation

## 2019-08-05 NOTE — H&P (Signed)
Colleen Zuniga is an 59 y.o. female.   Chief Complaint: Back bilateral leg pain HPI: 59 year old female with longstanding history of back trouble previously undergone an L2-S1 fusion however pseudoarthrosis L5-S1 and has had progressive degenerative collapse and scoliotic deformity at L1-L2.  Due to patient's progression of clinical syndrome imaging findings and failed conservative treatment I recommended revision of fusion L5-S1 with placement of bilateral iliac screws with pelvic fixation and interbody fusion L1-L2 to correct her scoliotic deformity.  I have extensively gone over the risks and benefits of an anterior lateral interbody fusion L1-L2 and subsequent posterior instrumented fusion with pelvic fixation from L1 to the pelvis.  I have gone over the risks benefits perioperative course expectations of outcome and alternatives of surgery and she understands and agrees to proceed forward.  Past Medical History:  Diagnosis Date  . Anxiety   . Arthritis   . Back pain   . De Quervain's tenosynovitis, right 01/2012  . Degenerative disc disease, cervical    states neck is stiff, reduced range of motion  . Dental crowns present   . Depression   . Diabetes mellitus    Insulin pump  . GERD (gastroesophageal reflux disease)   . Heart murmur    states has a functional murmur, and that she has never had any problems  . Hypothyroidism   . Joint pain   . PONV (postoperative nausea and vomiting)     Past Surgical History:  Procedure Laterality Date  . ANTERIOR CERVICAL DISCECTOMY  02/20/2016  . ANTERIOR LAT LUMBAR FUSION N/A 08/18/2016   Procedure: ANTERIOR LATERAL LUMBAR FUSION LUMBAR TWO- LUMBAR THREE, LUMBAR THREE- LUMBAR FOUR, LUMBAR FOUR- LUMBAR FIVE; LUMBAR TWO- LUMBAR FIVE PEDICLE SCREW FIXATION;  Surgeon: Jovita Gamma, MD;  Location: [place];  Service: Neurosurgery;  Laterality: N/A;  ANTERIOR LATERAL LUMBAR FUSION LUMBAR 2- LUMBAR 3, LUMBAR 3- LUMBAR 4-, LUMBAR 4- LUMBAR 5; LUMBAR 2-  LUMBAR 5 PEDICLE SCREW FIXATION  . BACK SURGERY  07/2018   plif l5-s1  . Cataracts Bilateral   . CESAREAN SECTION    . DORSAL COMPARTMENT RELEASE  02/08/2002   first dorsal compartment left wrist  . DORSAL COMPARTMENT RELEASE  02/03/2012   Procedure: RELEASE DORSAL COMPARTMENT (DEQUERVAIN);  Surgeon: Wynonia Sours, MD;  Location: [facility];  Service: Orthopedics;  Laterality: Right;  RELEASE DEQUERVAINS RIGHT WRIST  . KNEE ARTHROSCOPY  10/14/2004   right  . SHOULDER ARTHROSCOPY  05/05/2005   left  . TRIGGER FINGER RELEASE  01/07/2011   release A1 pulley left thumb  . TRIGGER FINGER RELEASE     x 5 other fingers    Family History  Problem Relation Age of Onset  . Stroke Father   . Diabetes Father   . Heart disease Father   . Kidney disease Father   . Obesity Father   . Colon cancer Neg Hx   . Esophageal cancer Neg Hx   . Rectal cancer Neg Hx   . Stomach cancer Neg Hx    Social History:  reports that she quit smoking about 28 years ago. She has never used smokeless tobacco. She reports current alcohol use. She reports that she does not use drugs.  Allergies:  Allergies  Allergen Reactions  . Penicillins Anaphylaxis and Other (See Comments)    CLOSES THROAT PATIENT HAD A PCN REACTION WITH IMMEDIATE RASH, FACIAL/TONGUE/THROAT SWELLING, SOB, OR LIGHTHEADEDNESS WITH HYPOTENSION:  #  #  #  YES  #  #  #  Has patient had a PCN reaction causing severe rash involving mucus membranes or skin necrosis: UNKNOWN  Has patient had a PCN reaction that required hospitalization:No Has patient had a PCN reaction occurring within the last 10 years:No   . Hydrocodone-Acetaminophen     Nausea/vomiting with or without food  . Adhesive [Tape] Rash    Itching    Medications Prior to Admission  Medication Sig Dispense Refill  . acetaminophen (TYLENOL) 325 MG tablet Take 325 mg by mouth 3 (three) times daily.     Marland Kitchen ALPRAZolam (XANAX) 0.5 MG tablet Take 0.25-0.5 mg by mouth at  bedtime as needed for sleep or anxiety.  3  . Ascorbic Acid (VITAMIN C WITH ROSE HIPS) 500 MG tablet Take 500 mg by mouth daily after breakfast.     . atorvastatin (LIPITOR) 10 MG tablet Take 10 mg by mouth daily after supper.    Marland Kitchen BAQSIMI TWO PACK 3 MG/DOSE POWD Place 3 mg into the nose once.    Marland Kitchen buPROPion (WELLBUTRIN XL) 300 MG 24 hr tablet Take 300 mg by mouth daily before breakfast.    . CALCIUM-VITAMIN D PO Take 500 mg by mouth 2 (two) times a day.     . Continuous Glucose Monitor DEVI 1 Device by Other route continuous.    Marland Kitchen etodolac (LODINE) 500 MG tablet Take 500 mg by mouth daily.     . fluticasone (FLONASE) 50 MCG/ACT nasal spray Place 2 sprays into both nostrils daily.    Marland Kitchen gabapentin (NEURONTIN) 300 MG capsule Take 300 mg by mouth 4 (four) times daily.     . insulin aspart (NOVOLOG) 100 UNIT/ML injection Continuous with insulin pump    . Insulin Human (INSULIN PUMP) SOLN Inject into the skin continuous. Novolog    . irbesartan (AVAPRO) 300 MG tablet Take 300 mg by mouth daily after breakfast.    . levocetirizine (XYZAL) 5 MG tablet Take 5 mg by mouth daily.    Marland Kitchen levothyroxine (SYNTHROID, LEVOTHROID) 137 MCG tablet Take 137 mcg by mouth daily before breakfast.    . Magnesium 500 MG TABS Take 500 mg by mouth daily after breakfast.    . metoCLOPramide (REGLAN) 10 MG tablet Take 10 mg by mouth daily as needed for nausea.    . Multiple Vitamin (MULTIVITAMIN WITH MINERALS) TABS tablet Take 1 tablet by mouth daily after breakfast.    . potassium gluconate 595 (99 K) MG TABS tablet Take 595 mg by mouth daily after breakfast.    . Propylene Glycol (SYSTANE COMPLETE) 0.6 % SOLN Place 2 drops into both eyes 3 (three) times daily as needed (dry eyes).    Marland Kitchen albuterol (VENTOLIN HFA) 108 (90 Base) MCG/ACT inhaler Inhale 2 puffs into the lungs every 4 (four) hours as needed.    . ondansetron (ZOFRAN) 4 MG tablet Take 1 tablet (4 mg total) by mouth daily as needed for nausea or vomiting. (Patient  not taking: Reported on 07/25/2019) 30 tablet 3    Results for orders placed or performed during the hospital encounter of 08/05/19 (from the past 48 hour(s))  Glucose, capillary     Status: Abnormal   Collection Time: 08/05/19  6:09 AM  Result Value Ref Range   Glucose-Capillary 142 (H) 70 - 99 mg/dL    Comment: Glucose reference range applies only to samples taken after fasting for at least 8 hours.   No results found.  Review of Systems  Musculoskeletal: Positive for back pain.  Neurological: Positive for numbness.  Blood pressure (!) 134/50, pulse 76, temperature 98.1 F (36.7 C), resp. rate 17, height 5\' 2"  (1.575 m), weight 87.5 kg, SpO2 98 %. Physical Exam  Constitutional: She is oriented to person, place, and time. She appears well-developed.  HENT:  Head: Normocephalic.  Eyes: Pupils are equal, round, and reactive to light.  Respiratory: Effort normal.  GI: Soft.  Musculoskeletal:        General: Normal range of motion.     Cervical back: Normal range of motion.  Neurological: She is alert and oriented to person, place, and time. She has normal strength. GCS eye subscore is 4. GCS verbal subscore is 5. GCS motor subscore is 6.  Strength is 5 out of 5 iliopsoas, quads, hamstrings, gastroc, into tibialis, EHL.  Skin: Skin is warm and dry.     Assessment/Plan 59 year old female presents for anterior lateral interbody fusion L1-L2 posterior L1 to the pelvis instrumented fusion fixation and redo L5-S1.  Colleen Zuniga P, MD 08/05/2019, 7:21 AM

## 2019-08-05 NOTE — Progress Notes (Signed)
Pharmacy Antibiotic Note  Colleen Zuniga is a 59 y.o. female admitted on 08/05/2019 with pseudoarthrosis L5-S1 degenerative scoliosis and deformity L1-L2.  Pt is S/P fusion surgery this afternoon. Pharmacy has been consulted for vancomycin dosing for post-op surgical prophylaxis.  Pt has drain in place post op (confirmed with RN). Pt rec'd vancomycin 1 gm IV X 1 pre op at 0708 AM today. Scr 0.60, CrCl 76.9 ml/min (renal function stable)   Plan: Vancomycin 1 gm IV Q 12 hrs (goal trough: 10-15 mg/L) Monitor WBC, temp, renal function  Height: 5\' 2"  (157.5 cm) Weight: 87.5 kg (193 lb) IBW/kg (Calculated) : 50.1  Temp (24hrs), Avg:97.9 F (36.6 C), Min:97.7 F (36.5 C), Max:98.1 F (36.7 C)  Recent Labs  Lab 08/05/19 0847 08/05/19 1200 08/05/19 1332 08/05/19 1430 08/05/19 1516  CREATININE 0.70 0.60 0.70 0.60 0.60    Estimated Creatinine Clearance: 76.9 mL/min (by C-G formula based on SCr of 0.6 mg/dL).    Allergies  Allergen Reactions  . Penicillins Anaphylaxis and Other (See Comments)    CLOSES THROAT PATIENT HAD A PCN REACTION WITH IMMEDIATE RASH, FACIAL/TONGUE/THROAT SWELLING, SOB, OR LIGHTHEADEDNESS WITH HYPOTENSION:  #  #  #  YES  #  #  #   Has patient had a PCN reaction causing severe rash involving mucus membranes or skin necrosis: UNKNOWN  Has patient had a PCN reaction that required hospitalization:No Has patient had a PCN reaction occurring within the last 10 years:No   . Hydrocodone-Acetaminophen     Nausea/vomiting with or without food  . Adhesive [Tape] Rash    Itching    Microbiology results: 5/20 MRSA PCR: negative 5/25 COVID: negative  Thank you for allowing pharmacy to be a part of this patient's care.  6/25, PharmD, BCPS, [facility] Clinical Pharmacist 08/05/2019 6:02 PM

## 2019-08-05 NOTE — Progress Notes (Signed)
Inpatient Diabetes Program Recommendations  AACE/ADA: New Consensus Statement on Inpatient Glycemic Control (2015)  Target Ranges:  Prepandial:   less than 140 mg/dL      Peak postprandial:   less than 180 mg/dL (1-2 hours)      Critically ill patients:  140 - 180 mg/dL   Lab Results  Component Value Date   GLUCAP 148 (H) 08/05/2019   HGBA1C 6.8 (H) 07/28/2019    Review of Glycemic Control  Diabetes history: DM type 1 Outpatient Diabetes medications: insulin pump, Dexcom continuous glucose monitor Current orders for Inpatient glycemic control: Novolog 0-20 units tid + insulin pump order set  Inpatient Diabetes Program Recommendations:    Spoke with PACU RN, over heard Dr. Wynetta Emery desires pt to operate insulin pump post procedure. Correction scale ordered to cover glucose until pt can operate insulin pump.  Placed correct insulin pump order set for Dr. Wynetta Emery to cosign. Will follow pt.  Thanks,  Christena Deem RN, MSN, BC-ADM Inpatient Diabetes Coordinator Team Pager [phone] (8a-5p)

## 2019-08-05 NOTE — Transfer of Care (Signed)
Immediate Anesthesia Transfer of Care Note  Patient: Colleen Zuniga  Procedure(s) Performed: Posterior lateral fusion - Lumbar Five-Sacral One with pelvic fixation with iliac screws and Nuvasive instrumentation (N/A Spine Lumbar) APPLICATION OF INTRAOPERATIVE CT SCAN (N/A Spine Lumbar) ANTERIOR LATERAL LUMBAR INTERBODY FUSION LUMBAR ONE-TWO. (N/A Spine Lumbar)  Patient Location: PACU  Anesthesia Type:General  Level of Consciousness: drowsy and patient cooperative  Airway & Oxygen Therapy: Patient Spontanous Breathing and Patient connected to face mask oxygen  Post-op Assessment: Report given to RN and Post -op Vital signs reviewed and stable  Post vital signs: Reviewed and stable  Last Vitals:  Vitals Value Taken Time  BP 98/47 08/05/19 1557  Temp    Pulse 77 08/05/19 1602  Resp 18 08/05/19 1602  SpO2 100 % 08/05/19 1602  Vitals shown include unvalidated device data.  Last Pain:  Vitals:   08/05/19 0606  PainSc: 10-Worst pain ever      Patients Stated Pain Goal: 2 (08/05/19 0606)  Complications: No apparent anesthesia complications

## 2019-08-05 NOTE — Anesthesia Procedure Notes (Signed)
Arterial Line Insertion Start/End5/28/2021 7:38 AM, 08/05/2019 7:45 AM Performed by: Shelton Silvas, MD, Rosalio Macadamia, CRNA, CRNA  Patient location: OR. Preanesthetic checklist: patient identified, IV checked, site marked, risks and benefits discussed, surgical consent, monitors and equipment checked, pre-op evaluation, timeout performed and anesthesia consent Lidocaine 1% used for infiltration and patient sedated Left, radial was placed Catheter size: 20 G Hand hygiene performed , maximum sterile barriers used  and Seldinger technique used Allen's test indicative of satisfactory collateral circulation Attempts: 1 Procedure performed without using ultrasound guided technique. Following insertion, dressing applied. Post procedure assessment: normal  Patient tolerated the procedure well with no immediate complications.

## 2019-08-05 NOTE — Op Note (Signed)
Preoperative diagnosis: Pseudoarthrosis L5-S1 degenerative scoliosis and deformity L1-L2  Postoperative diagnosis: Same  Procedure: Stage I: Anterior lateral interbody fusion utilizing the Alphatec titanium interbody cage mixed with osteocel pro with placement of a 8 mm x 16 mm x 40 mm cage  Surgeon for stage I: Designer, television/film set for stage I: Julien Girt  Stage II: Posterior lumbar instrumented fusion from L1 to the pelvis with pelvic fixation utilizing placement of bilateral iliac screws and placement of bilateral L1 pedicle screws and exploration fusion removal of hardware L2-S1 utilizing the NuVasive pedicle screw set 5.5 and Brain[employer] stereotactic navigation for placement of bilateral iliac screws and L1 pedicle screws  2.  Posterior lateral arthrodesis L1-L2 and L5-S1 to the pelvis utilizing BMP and osteocel pro  Surgeon: For stage II: Sol Passer for stage II: Lisbeth Renshaw  Anesthesia: General  EBL: 55  HPI: 59-year-old female with longstanding back and bilateral hip and leg pain work-up revealed degenerative scoliosis at L1-L2 and persistent pseudoarthrosis L5-S1 and patient already undergone a revision for pseudoarthrosis 1 time already.  Due to patient's failed conservative treatment imaging findings and progression of clinical syndrome I recommended redo fusion at L5-S1 with placement of bilateral iliac screws for pelvic fixation and removal of loose bilateral S1 pedicle screws and L1 to interbody fusion to correct the scoliotic deformity.  I extensively went over the risks and benefits of the operation with her as well as perioperative course expectations of outcome and alternatives of surgery and she understood and agreed to proceed forward.  Operative procedure: Patient brought into the OR was due to general anesthesia positioned initially in the lateral decubitus right side up for L the anterolateral part of the procedure.  After patient was positioned and  utilizing intraoperative neuro monitoring incisions were drawn out localizing over the L1-2 interspace working between the 11th and 12th rib I passed the dilator over my finger from the 2 incision technique down onto the lateral aspect the disc base and a transpsoas technique.  I was able to Dr. Dani Gobble however initially was riding kind of high he would not seat secondary to working between the ribs.  However we did position it in close proximity identify the disc base under direct visualization with continuous neuro monitoring and stimulation at each step along the way.  I then deployed the shim anchored everything in place.  Then under fluoroscopy incised the disc base prepared the endplates and sized up an 8 mm 40 mm cage packed with osteocell pro and inserted under fluoroscopy confirmed good position by both AP and lateral images.  I continue to work entire time in the retroperitoneal space then closed the wound in layers with Vicryl both incisions were closed with a running 4 subcuticular patient was then remitted to the stretcher and repositioned for the second stage of the case.  After repositioning prone on the [place] table with the Aero stereotactic navigation system, patient's old midline incision was opened up and extended slightly cephalad caudal the scar tissue was dissected free and I exposed the lamina from T12 down to the pelvis did identify the hardware identified the L1 pedicle screw entry point then disconnected the entire system removing all the medial connectors and the medialize rods and 2 loose S1 screws that were very very loose.  I then subfascially dissected over to the posterior superior iliac spine and after adequate exposure been achieved I applied and deployed the reference admitted on the T12 spinous process and lamina  intraoperative CT was then performed and registration.  And then utilizing stereotactic navigation I placed bilateral iliac screws and bilateral L1 pedicle screws and  then obtained a postoperative CT scan to confirm all screws were good position.  Then we remove the reference array and aggressively dissected posterior laterally at L1-L2 as well as down L5-S1 expose the sacral ala the residua of the lamina and pedicle of S1 aggressively decorticated the sacral ala and lamina and pedicle of S1 as well as TPs at L5 and extending up to L4 on the patient's left side.  Also aggressively decorticated the facets and lateral pars and lamina at L1-L2.  An extensive mount of BMP and posterior lateral bone utilizing osteocell pro as well as vision was packed at L5-S1 and at L1-L2.  Then utilizing the NuVasive Bandini rod bending system we contoured to rods and anchored everything in place.  I utilized side connectors to the iliac screws and everything was torqued down and anchored in place I did compress the L1 pedicle screw against L2 on the left to help reduce further her deformity.  Meticulous hemostasis was then maintained the wound was copiously irrigated injected Exparel in the fascia placed a large Hemovac drain and closed the wound in layers with Vicryl in a running 4 subcuticular Dermabond benzoin Steri-Strips and a sterile dressing was applied patient recovery in stable condition.  At the end the case all needle counts and sponge counts were correct.

## 2019-08-05 NOTE — Progress Notes (Signed)
Orthopedic Tech Progress Note Patient Details:  Colleen Zuniga 07/23/1960 [id number] Patient has brace Patient ID: [name], female   DOB: 04-27-1960, 59 y.o.   MRN: [id number]   [name] 08/05/2019, 6:36 PM

## 2019-08-06 LAB — GLUCOSE, CAPILLARY
Glucose-Capillary: 161 mg/dL — ABNORMAL HIGH (ref 70–99)
Glucose-Capillary: 89 mg/dL (ref 70–99)
Glucose-Capillary: 162 mg/dL — ABNORMAL HIGH (ref 70–99)
Glucose-Capillary: 150 mg/dL — ABNORMAL HIGH (ref 70–99)
Glucose-Capillary: 191 mg/dL — ABNORMAL HIGH (ref 70–99)

## 2019-08-06 LAB — CBC WITH DIFFERENTIAL/PLATELET
Abs Immature Granulocytes: 0.04 10*3/uL (ref 0.00–0.07)
Basophils Absolute: 0 10*3/uL (ref 0.0–0.1)
Basophils Relative: 0 %
Eosinophils Absolute: 0 10*3/uL (ref 0.0–0.5)
Eosinophils Relative: 0 %
HCT: 25.2 % — ABNORMAL LOW (ref 36.0–46.0)
Hemoglobin: 7.8 g/dL — ABNORMAL LOW (ref 12.0–15.0)
Immature Granulocytes: 1 %
Lymphocytes Relative: 17 %
Lymphs Abs: 1.3 10*3/uL (ref 0.7–4.0)
MCH: 28.6 pg (ref 26.0–34.0)
MCHC: 31 g/dL (ref 30.0–36.0)
MCV: 92.3 fL (ref 80.0–100.0)
Monocytes Absolute: 0.7 10*3/uL (ref 0.1–1.0)
Monocytes Relative: 9 %
Neutro Abs: 5.8 10*3/uL (ref 1.7–7.7)
Neutrophils Relative %: 73 %
Platelets: 200 10*3/uL (ref 150–400)
RBC: 2.73 MIL/uL — ABNORMAL LOW (ref 3.87–5.11)
RDW: 13 % (ref 11.5–15.5)
WBC: 7.9 10*3/uL (ref 4.0–10.5)
nRBC: 0 % (ref 0.0–0.2)

## 2019-08-06 LAB — TROPONIN I (HIGH SENSITIVITY)
Troponin I (High Sensitivity): 91 ng/L — ABNORMAL HIGH (ref <18)
Troponin I (High Sensitivity): 81 ng/L — ABNORMAL HIGH (ref <18)
Troponin I (High Sensitivity): 107 ng/L (ref <18)

## 2019-08-06 LAB — PREPARE RBC (CROSSMATCH)

## 2019-08-06 MED ORDER — DIPHENHYDRAMINE HCL 25 MG PO CAPS
25.0000 mg | ORAL_CAPSULE | ORAL | Status: DC | PRN
  Administered 2019-08-07: 25 mg via ORAL
  Filled 2019-08-06: qty 1

## 2019-08-06 MED ORDER — SODIUM CHLORIDE 0.9% IV SOLUTION: Freq: Once | INTRAVENOUS | Status: AC

## 2019-08-06 NOTE — Progress Notes (Addendum)
  NEUROSURGERY PROGRESS NOTE   No issues overnight.  Complains of appropriate back soreness as well as chest pain Chest pain: central. Does not radiate. Sore to touch. Attributes to positioning. No SOB, N/V. No new N/T/W About to work with therapy  EXAM:  BP (!) 106/46 (BP Location: Left Arm)   Pulse 81   Temp 98.3 F (36.8 C)   Resp 20   Ht 5\' 2"  (1.575 m)   Wt 87.5 kg   LMP  (LMP Unknown)   SpO2 99%   BMI 35.30 kg/m   Awake, alert, oriented  Speech fluent, appropriate  CN grossly intact  MAEW Incision: dry bandage  CBC Latest Ref Rng & Units 08/06/2019 08/05/2019 08/05/2019  WBC 4.0 - 10.5 K/uL 7.9 - -  Hemoglobin 12.0 - 15.0 g/dL 7.8(L) 8.2(L) 8.5(L)  Hematocrit 36.0 - 46.0 % 25.2(L) 24.0(L) 25.0(L)  Platelets 150 - 400 K/uL 200 - -     IMPRESSION/PLAN 59 y.o. female POD1 lumbar fusion. Doing fair - ABL anemia: asx, will trend. Hold transfusion for now - chest pain: ECG, troponin - pain control - PT/OT

## 2019-08-06 NOTE — Plan of Care (Signed)

## 2019-08-06 NOTE — Progress Notes (Addendum)
  NEUROSURGERY PROGRESS NOTE   Patient was complaining of chest pain earlier this am without associated shortness of breath, N/V, dizziness, arm pain. Troponin and ECG ordered.  ECG reviewed and normal. Initial trop 91. Second trop 81. Called nursing who states patient is no longer complaining of chest pain. Will monitor.   Addendum Received call from nursing that patient started complaining of palpitations. She was placed on tele. Troponin came back at 107. Repeat ECG ordered and again normal. Discussed with cardiology. Will given transfusion to correct ABL anemia, obtain echo in the am. Consult pending echo results.

## 2019-08-06 NOTE — Evaluation (Signed)
Physical Therapy Evaluation Patient Details Name: Colleen Zuniga MRN: [id number] DOB: 12-08-1960 Today's Date: 08/06/2019   History of Present Illness  59 year old female with longstanding history of back trouble previously undergone an L2-S1 fusion however pseudoarthrosis L5-S1 and has had progressive degenerative collapse and scoliotic deformity at L1-L2. Pt underwent revision of L5-S1 fusion with pelvic fixation and interbody fusion L1-2. PMH: Type I DM. de quervain's tenosynovitis, R. PSH: has had previous back surgeries.    Clinical Impression  Pt admitted with above. Pt very motivated and eager to get up. Pt assisted to EOB and experienced lightheadedness and nausea. BP taken at EOB 63/44, returned to supine, symptoms immeadiately dissipated. BP taken again in supine at 93/53. Suspect once BP is under control pt will progress well and be able to ambulate. Acute PT to cont to follow.    Follow Up Recommendations Home health PT;Supervision/Assistance - 24 hour    Equipment Recommendations  (pt has RW)    Recommendations for Other Services       Precautions / Restrictions Precautions Precautions: Fall;Back Precaution Booklet Issued: Yes (comment) Precaution Comments: went over handout, pt recalled most of them from previous surgeries. Watch BP, dropped significantly Required Braces or Orthoses: Spinal Brace Spinal Brace: Lumbar corset;Applied in sitting position Restrictions Weight Bearing Restrictions: No      Mobility  Bed Mobility Overal bed mobility: Needs Assistance Bed Mobility: Rolling;Sidelying to Sit;Sit to Sidelying Rolling: Min assist Sidelying to sit: Min assist     Sit to sidelying: Min assist General bed mobility comments: increased time, max directional verbal cues, minA to initiate log roll, minA for trunk elevation, minA for LE management back into bed  Transfers                 General transfer comment: unable to attempt due to drop in BP  to63/44  Ambulation/Gait             General Gait Details: unable due to low BP  Stairs            Wheelchair Mobility    Modified Rankin (Stroke Patients Only)       Balance Overall balance assessment: Needs assistance Sitting-balance support: Bilateral upper extremity supported;Feet supported Sitting balance-Leahy Scale: Fair Sitting balance - Comments: pt prefers to support self with bilat UEs for pain managment                                     Pertinent Vitals/Pain Pain Assessment: 0-10 Pain Score: 8  Pain Location: chest, Vinny, PA aware, then also at incision Pain Descriptors / Indicators: Sore Pain Intervention(s): Monitored during session;Premedicated before session(PA notified)    Home Living Family/patient expects to be discharged to:: Private residence Living Arrangements: Spouse/significant other Available Help at Discharge: Family;Available 24 hours/day Type of Home: House Home Access: Stairs to enter Entrance Stairs-Rails: Right;Left(on front 5 steps, can't reach both) Entrance Stairs-Number of Steps: 3 without HR, or 5 with one handrail Home Layout: Two level Home Equipment: Walker - 2 wheels      Prior Function Level of Independence: Independent         Comments: pt was amb without AD, indep with ADLs and IADLs     Hand Dominance   Dominant Hand: Right    Extremity/Trunk Assessment   Upper Extremity Assessment Upper Extremity Assessment: Overall WFL for tasks assessed    Lower Extremity Assessment Lower Extremity  Assessment: Overall WFL for tasks assessed    Cervical / Trunk Assessment Cervical / Trunk Assessment: Other exceptions Cervical / Trunk Exceptions: back surgery  Communication   Communication: No difficulties  Cognition Arousal/Alertness: Awake/alert Behavior During Therapy: WFL for tasks assessed/performed Overall Cognitive Status: Within Functional Limits for tasks assessed                                         General Comments General comments (skin integrity, edema, etc.): supine BP 106/46, upon sitting EOB pt reported mild dizziness which then worsened and pt felt like she had to throw up, BP 83/37, then further dropped to 63/44 s/p an additional minute. Pt returned to supine and BP 93/53, pt reports that the dizziness dissipated    Exercises     Assessment/Plan    PT Assessment Patient needs continued PT services  PT Problem List Decreased strength;Decreased activity tolerance;Decreased balance;Decreased mobility;Decreased coordination;Decreased knowledge of use of DME       PT Treatment Interventions DME instruction;Functional mobility training;Therapeutic activities;Therapeutic exercise    PT Goals (Current goals can be found in the Care Plan section)  Acute Rehab PT Goals Patient Stated Goal: get moving PT Goal Formulation: With patient Time For Goal Achievement: 08/16/19 Potential to Achieve Goals: Good    Frequency Min 5X/week   Barriers to discharge        Co-evaluation               AM-PAC PT "6 Clicks" Mobility  Outcome Measure Help needed turning from your back to your side while in a flat bed without using bedrails?: A Little Help needed moving from lying on your back to sitting on the side of a flat bed without using bedrails?: A Little Help needed moving to and from a bed to a chair (including a wheelchair)?: A Lot Help needed standing up from a chair using your arms (e.g., wheelchair or bedside chair)?: A Lot Help needed to walk in hospital room?: A Lot Help needed climbing 3-5 steps with a railing? : A Lot 6 Click Score: 14    End of Session Equipment Utilized During Treatment: Gait belt;Back brace Activity Tolerance: Treatment limited secondary to medical complications (Comment)(drop in BP) Patient left: in bed;with call bell/phone within reach Nurse Communication: Mobility status(drop in BP) PT Visit Diagnosis:  Difficulty in walking, not elsewhere classified (R26.2)    Time: 5885-0277 PT Time Calculation (min) (ACUTE ONLY): 40 min   Charges:   PT Evaluation $PT Eval Moderate Complexity: 1 Mod PT Treatments $Therapeutic Activity: 8-22 mins        Kittie Plater, PT, DPT Acute Rehabilitation Services Pager #: [phone] Office #: [phone]   Berline Lopes 08/06/2019, 10:29 AM

## 2019-08-06 NOTE — Anesthesia Postprocedure Evaluation (Signed)
Anesthesia Post Note  Patient: Colleen Zuniga  Procedure(s) Performed: Posterior lateral fusion - Lumbar Five-Sacral One with pelvic fixation with iliac screws and Nuvasive instrumentation (N/A Spine Lumbar) APPLICATION OF INTRAOPERATIVE CT SCAN (N/A Spine Lumbar) ANTERIOR LATERAL LUMBAR INTERBODY FUSION LUMBAR ONE-TWO. (N/A Spine Lumbar)     Patient location during evaluation: PACU Anesthesia Type: General Level of consciousness: awake and alert Pain management: pain level controlled Vital Signs Assessment: post-procedure vital signs reviewed and stable Respiratory status: spontaneous breathing, nonlabored ventilation, respiratory function stable and patient connected to nasal cannula oxygen Cardiovascular status: blood pressure returned to baseline and stable Postop Assessment: no apparent nausea or vomiting Anesthetic complications: no    Last Vitals:  Vitals:   08/06/19 0024 08/06/19 0410  BP: (!) 130/47 (!) 116/49  Pulse: 95 70  Resp: 18   Temp: 36.7 C 37 C  SpO2: 100% 99%    Last Pain:  Vitals:   08/06/19 0501  TempSrc:   PainSc: Asleep                 Shelton Silvas

## 2019-08-07 ENCOUNTER — Inpatient Hospital Stay (HOSPITAL_COMMUNITY): Payer: Medicare Other

## 2019-08-07 DIAGNOSIS — R079 Chest pain, unspecified: Secondary | ICD-10-CM

## 2019-08-07 LAB — GLUCOSE, CAPILLARY
Glucose-Capillary: 142 mg/dL — ABNORMAL HIGH (ref 70–99)
Glucose-Capillary: 132 mg/dL — ABNORMAL HIGH (ref 70–99)
Glucose-Capillary: 307 mg/dL — ABNORMAL HIGH (ref 70–99)

## 2019-08-07 LAB — CBC
HCT: 34.1 % — ABNORMAL LOW (ref 36.0–46.0)
Hemoglobin: 10.9 g/dL — ABNORMAL LOW (ref 12.0–15.0)
MCH: 28.5 pg (ref 26.0–34.0)
MCHC: 32 g/dL (ref 30.0–36.0)
MCV: 89.3 fL (ref 80.0–100.0)
Platelets: 156 10*3/uL (ref 150–400)
RBC: 3.82 MIL/uL — ABNORMAL LOW (ref 3.87–5.11)
RDW: 14.2 % (ref 11.5–15.5)
WBC: 7.1 10*3/uL (ref 4.0–10.5)
nRBC: 0 % (ref 0.0–0.2)

## 2019-08-07 LAB — ECHOCARDIOGRAM COMPLETE
Height: 62 in
Weight: 3088 oz

## 2019-08-07 MED ORDER — PANTOPRAZOLE SODIUM 40 MG PO TBEC
40.0000 mg | DELAYED_RELEASE_TABLET | Freq: Every day | ORAL | Status: DC
  Administered 2019-08-07 – 2019-08-08 (×2): 40 mg via ORAL
  Filled 2019-08-07 (×2): qty 1

## 2019-08-07 NOTE — Progress Notes (Signed)
*  PRELIMINARY RESULTS* Echocardiogram 2D Echocardiogram has been performed.  Colleen Zuniga 08/07/2019, 9:45 AM

## 2019-08-07 NOTE — Progress Notes (Signed)
Subjective: Patient reports sore in back.   Objective: Vital signs in last 24 hours: Temp:  [97.8 F (36.6 C)-99.3 F (37.4 C)] 99.1 F (37.3 C) (05/30 0724) Pulse Rate:  [83-93] 92 (05/30 0724) Resp:  [14-24] 15 (05/30 0724) BP: (107-145)/(39-64) 145/64 (05/30 0724) SpO2:  [89 %-100 %] 91 % (05/30 0724)  Intake/Output from previous day: 05/29 0701 - 05/30 0700 In: 468 [P.O.:100; Blood:368] Out: 1500 [Urine:1150; Drains:350] Intake/Output this shift: No intake/output data recorded.  Physical Exam: Dressings CDI.  Full strength both legs.  Dressings CDI, drain output 100 cc last shift.  Lab Results: Recent Labs    08/05/19 1516 08/06/19 0559  WBC  --  7.9  HGB 8.2* 7.8*  HCT 24.0* 25.2*  PLT  --  200   BMET Recent Labs    08/05/19 1430 08/05/19 1516  NA 140 141  K 4.3 4.2  CL 106 106  GLUCOSE 190* 174*  BUN 20 19  CREATININE 0.60 0.60    Studies/Results: DG Lumbar Spine 2-3 Views  Result Date: 08/05/2019 CLINICAL DATA:  Lumbar disc disease. EXAM: LUMBAR SPINE - 2-3 VIEW; DG C-ARM 1-60 MIN COMPARISON:  CT scan dated 07/01/2019 FINDINGS: AP and lateral C-arm images demonstrate the patient has undergone interbody fusion at L1-2. The hardware appears in good position. Alignment of the lumbar vertebra at L1-2 is normal. IMPRESSION: Satisfactory appearance of the lumbar spine after interbody fusion at L1-2. FLUOROSCOPY TIME:  2 minutes.  73.94 mGy. C-arm fluoroscopic images were obtained intraoperatively and submitted for post operative interpretation. Electronically Signed   By: Francene Boyers M.D.   On: 08/05/2019 16:07   DG C-Arm 1-60 Min  Result Date: 08/05/2019 CLINICAL DATA:  Lumbar disc disease. EXAM: LUMBAR SPINE - 2-3 VIEW; DG C-ARM 1-60 MIN COMPARISON:  CT scan dated 07/01/2019 FINDINGS: AP and lateral C-arm images demonstrate the patient has undergone interbody fusion at L1-2. The hardware appears in good position. Alignment of the lumbar vertebra at L1-2 is  normal. IMPRESSION: Satisfactory appearance of the lumbar spine after interbody fusion at L1-2. FLUOROSCOPY TIME:  2 minutes.  73.94 mGy. C-arm fluoroscopic images were obtained intraoperatively and submitted for post operative interpretation. Electronically Signed   By: Francene Boyers M.D.   On: 08/05/2019 16:07   DG OR LOCAL ABDOMEN  Result Date: 08/05/2019 CLINICAL DATA:  Status post interbody fusion. Intraoperative study with concern for potential retained instrument EXAM: OR LOCAL ABDOMEN COMPARISON:  None. FINDINGS: Decubitus image obtained intraoperatively. There are multiple metallic devices consistent with the screw and plate fixation and disc spacer. There are clips noted. There is no demonstrable retained surgical instrument, sponge, or needle. Paucity of bowel gas. IMPRESSION: Limited study due to technical factors. No apparent retained instrument, sponge, or needle. Paucity of bowel gas potentially could indicate ileus. These results were called by telephone at the time of interpretation on 08/05/2019 at 10:19 am to provider Kathleene Hazel, RN , who verbally acknowledged these results. Electronically Signed   By: Bretta Bang III M.D.   On: 08/05/2019 10:20    Assessment/Plan: Patient feels less dizzy this AM.  No chest pain.  Awaiting post-transfusion Hct.  Continue drain today.  Mobilize in brace with PT.    LOS: 2 days    Dorian Heckle, MD 08/07/2019, 8:35 AM

## 2019-08-07 NOTE — Progress Notes (Signed)
Physical Therapy Treatment Patient Details Name: Colleen Zuniga MRN: [id number] DOB: [birth year] Today's Date: 08/07/2019    History of Present Illness 59 year old female with longstanding history of back trouble previously undergone an L2-S1 fusion however pseudoarthrosis L5-S1 and has had progressive degenerative collapse and scoliotic deformity at L1-L2. Pt underwent revision of L5-S1 fusion with pelvic fixation and interbody fusion L1-2. PMH: Type I DM. de quervain's tenosynovitis, R. PSH: has had previous back surgeries.    PT Comments    Pt with no report of lightheadedness today. Pt's BP stable 130s/60s. Pt with improved transfers and was able to tolerate ambulation today. Suspect pt to progress quickly. Acute PT to cont to follow.   Follow Up Recommendations  Home health PT;Supervision/Assistance - 24 hour     Equipment Recommendations  None recommended by PT    Recommendations for Other Services       Precautions / Restrictions Precautions Precautions: Fall;Back Precaution Booklet Issued: Yes (comment) Precaution Comments: went over handout, pt recalled most of them from previous surgeries. Watch BP, dropped significantly Required Braces or Orthoses: Spinal Brace Spinal Brace: Lumbar corset;Applied in sitting position Restrictions Weight Bearing Restrictions: No    Mobility  Bed Mobility Overal bed mobility: Needs Assistance Bed Mobility: Rolling;Sidelying to Sit Rolling: Min assist Sidelying to sit: Min assist       General bed mobility comments: increased time but better than yesterday, denies dizziness  Transfers Overall transfer level: Needs assistance Equipment used: Rolling walker (2 wheeled) Transfers: Sit to/from Stand Sit to Stand: Min assist         General transfer comment: pt with good technique able to push up from bed, minA for initial power up  Ambulation/Gait Ambulation/Gait assistance: Min guard Gait Distance (Feet): 120  Feet Assistive device: Rolling walker (2 wheeled) Gait Pattern/deviations: Step-through pattern;Decreased stride length Gait velocity: decreased Gait velocity interpretation: <1.31 ft/sec, indicative of household ambulator General Gait Details: ambulated to the bathroom, minA for walker management over threshold into bathroom, amb 120' in hallway, 2 standing rest breaks, no episodes of dizziness, decreased pace but steady   Stairs             Wheelchair Mobility    Modified Rankin (Stroke Patients Only)       Balance Overall balance assessment: Needs assistance Sitting-balance support: Single extremity supported Sitting balance-Leahy Scale: Fair Sitting balance - Comments: pt able to perform pericare s/p tolieting in sitting, with L UE on RW   Standing balance support: No upper extremity supported Standing balance-Leahy Scale: Fair Standing balance comment: pt stood at sink to wash hands, brush teeth, brush hair, and put deodorant on without any UE support, close supervision                            Cognition Arousal/Alertness: Awake/alert Behavior During Therapy: WFL for tasks assessed/performed Overall Cognitive Status: Within Functional Limits for tasks assessed                                        Exercises      General Comments General comments (skin integrity, edema, etc.): VSS, pt remains to have hemovac drain, incision covered by dressing      Pertinent Vitals/Pain Pain Assessment: 0-10 Pain Score: 6  Pain Location: back Pain Descriptors / Indicators: Sore Pain Intervention(s): Monitored during session    Home  Living                      Prior Function            PT Goals (current goals can now be found in the care plan section) Progress towards PT goals: Progressing toward goals    Frequency    Min 5X/week      PT Plan Current plan remains appropriate    Co-evaluation               AM-PAC PT "6 Clicks" Mobility   Outcome Measure  Help needed turning from your back to your side while in a flat bed without using bedrails?: A Little Help needed moving from lying on your back to sitting on the side of a flat bed without using bedrails?: A Little Help needed moving to and from a bed to a chair (including a wheelchair)?: A Little Help needed standing up from a chair using your arms (e.g., wheelchair or bedside chair)?: A Little Help needed to walk in hospital room?: A Little Help needed climbing 3-5 steps with a railing? : A Lot 6 Click Score: 17    End of Session Equipment Utilized During Treatment: Gait belt;Back brace Activity Tolerance: Patient tolerated treatment well Patient left: in chair;with call bell/phone within reach;with chair alarm set Nurse Communication: Mobility status PT Visit Diagnosis: Difficulty in walking, not elsewhere classified (R26.2)     Time: 1740-8144 PT Time Calculation (min) (ACUTE ONLY): 36 min  Charges:  $Gait Training: 8-22 mins $Therapeutic Activity: 8-22 mins                     Kittie Plater, PT, DPT Acute Rehabilitation Services Pager #: [phone] Office #: [phone]    Berline Lopes 08/07/2019, 2:28 PM

## 2019-08-07 NOTE — Progress Notes (Addendum)
Pt complains of itching in her left hand, head, and neck shortly after start of Vancomycin infusion. No complaints of throat tightening, swelling, shortness of breath, or other signs of anaphylaxis. Infusion was paused, IV line flushed, and Benadryl administered. Pt received Vancomycin this am with no complaints at that time. Pt wonders if it could be a response to the medication since she had a similar reaction last night when she received Vancomycin. RN called [facility] and left message with receptionist for Dr. Venetia Maxon. Docia Barrier, NP, returned call shortly after and presented to the room to evaluate pt. Will await any new orders for decision on continuation or substitution of antibiotic. Report given to Cristie Hem, RN.   [phone]: Verbal orders from Docia Barrier, NP, to continue with Vancomycin. Pt response, if related to Vancomycin, appears to be mild and localized. He verified that pt received Benadryl, advised to slow rate of infusion, and ordered to monitor for any systemic reactions.  Robina Ade, RN

## 2019-08-07 NOTE — TOC Initial Note (Signed)
Transition of Care [facility]) - Initial/Assessment Note    Patient Details  Name: Colleen Zuniga MRN: [id number] Date of Birth: 05-03-1960  [facility] CM/SW Contact:    Deveron Furlong, RN: 08/07/2019, 5:19 PM  Clinical Narrative:                 Patient to d/c home with spouse and 2 sisters to assist as needed.  Patient has RW, 3n1 and handicap-equipped toilet. No DME needs.  Patient has no preference of HH agency. Medicare rated list reviewed. Patient agreeable to [facility].  Kandee Keen with [facility] accepted patient referral.   Expected Discharge Plan: Home w Home Health Services Barriers to Discharge: Continued Medical Work up   Patient Goals and CMS Choice Patient states their goals for this hospitalization and ongoing recovery are:: to get home CMS Medicare.gov Compare Post Acute Care list provided to:: Patient Choice offered to / list presented to : Patient  Expected Discharge Plan and Services Expected Discharge Plan: Home w Home Health Services     Post Acute Care Choice: Home Health Living arrangements for the past 2 months: Single Family Home                           HH Arranged: PT HH Agency: [facility] Date [facility] Agency Contacted: 08/07/19 Time HH Agency Contacted: 1616    Prior Living Arrangements/Services Living arrangements for the past 2 months: Single Family Home Lives with:: Spouse              Current home services: DME     Admission diagnosis:  Pseudoarthrosis of lumbar spine [S32.009K] Patient Active Problem List   Diagnosis Date Noted  . Pseudoarthrosis of lumbar spine 08/05/2019  . 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 Lumbar stenosis 08/18/2016  . Arthritis   . Hypokalemia   . Chest pain    PCP:  Chilton Greathouse, MD Pharmacy:   [id number] [name], [place] - [address] [place] [address] Phone: [phone] Fax: [phone]  [facility] - [place], [place] - [address] Portal to Registered [facility] Sites [address] [place] [address] Phone: [phone] Fax: [phone]

## 2019-08-08 LAB — GLUCOSE, CAPILLARY
Glucose-Capillary: 117 mg/dL — ABNORMAL HIGH (ref 70–99)
Glucose-Capillary: 158 mg/dL — ABNORMAL HIGH (ref 70–99)
Glucose-Capillary: 111 mg/dL — ABNORMAL HIGH (ref 70–99)
Glucose-Capillary: 118 mg/dL — ABNORMAL HIGH (ref 70–99)
Glucose-Capillary: 133 mg/dL — ABNORMAL HIGH (ref 70–99)
Glucose-Capillary: 157 mg/dL — ABNORMAL HIGH (ref 70–99)

## 2019-08-08 LAB — BASIC METABOLIC PANEL
Anion gap: 9 (ref 5–15)
BUN: 11 mg/dL (ref 6–20)
CO2: 23 mmol/L (ref 22–32)
Calcium: 9.4 mg/dL (ref 8.9–10.3)
Chloride: 107 mmol/L (ref 98–111)
Creatinine, Ser: 0.84 mg/dL (ref 0.44–1.00)
GFR calc Af Amer: >60 mL/min (ref >60)
GFR calc non Af Amer: >60 mL/min (ref >60)
Glucose, Bld: 88 mg/dL (ref 70–99)
Potassium: 4 mmol/L (ref 3.5–5.1)
Sodium: 139 mmol/L (ref 135–145)

## 2019-08-08 NOTE — Progress Notes (Signed)
Physical Therapy Treatment Patient Details Name: Colleen Zuniga MRN: [id number] DOB: [birth year] Today's Date: 08/08/2019    History of Present Illness 59 year old female with longstanding history of back trouble previously undergone an L2-S1 fusion however pseudoarthrosis L5-S1 and has had progressive degenerative collapse and scoliotic deformity at L1-L2. Pt underwent revision of L5-S1 fusion with pelvic fixation and interbody fusion L1-2. PMH: Type I DM. de quervain's tenosynovitis, R. PSH: has had previous back surgeries.    PT Comments    Pt progressing well. Able to increase ambulation tolerance and complete stair negotiation with min guard. Pt with increased pain today, RN notified. Pt re-educated on given handout on back precautions. Pt requiring v/c's to minimize twisting. Acute PT to cont to follow    Follow Up Recommendations  Home health PT;Supervision/Assistance - 24 hour     Equipment Recommendations  None recommended by PT    Recommendations for Other Services       Precautions / Restrictions Precautions Precautions: Fall;Back Precaution Booklet Issued: Yes (comment) Precaution Comments: went over handout, pt recalled most of them from previous surgeries. Watch BP, dropped significantly Required Braces or Orthoses: Spinal Brace Spinal Brace: Lumbar corset;Applied in sitting position Restrictions Weight Bearing Restrictions: No    Mobility  Bed Mobility Overal bed mobility: Needs Assistance Bed Mobility: Rolling;Sidelying to Sit Rolling: Supervision Sidelying to sit: Min guard       General bed mobility comments: increased time, strong use of bed rail  Transfers Overall transfer level: Needs assistance Equipment used: Rolling walker (2 wheeled) Transfers: Sit to/from Stand Sit to Stand: Min guard         General transfer comment: increased time but good technique  Ambulation/Gait Ambulation/Gait assistance: Min guard Gait Distance (Feet): 200  Feet Assistive device: Rolling walker (2 wheeled) Gait Pattern/deviations: Step-through pattern;Decreased stride length Gait velocity: dec Gait velocity interpretation: 1.31 - 2.62 ft/sec, indicative of limited community ambulator General Gait Details: ambulated 200' and did stairs without standing or sitting rest break   Stairs Stairs: Yes Stairs assistance: Min guard Stair Management: One rail Right;Step to pattern;Sideways Number of Stairs: 5(to mimic home set up) General stair comments: pt with good carryover of technique, no instability, educated on "up with the good, and down with the bad"   Wheelchair Mobility    Modified Rankin (Stroke Patients Only)       Balance Overall balance assessment: Needs assistance Sitting-balance support: No upper extremity supported Sitting balance-Leahy Scale: Good     Standing balance support: No upper extremity supported Standing balance-Leahy Scale: Fair                              Cognition Arousal/Alertness: Awake/alert Behavior During Therapy: WFL for tasks assessed/performed Overall Cognitive Status: Within Functional Limits for tasks assessed                                        Exercises      General Comments General comments (skin integrity, edema, etc.): VSS, continues with drain      Pertinent Vitals/Pain Pain Assessment: 0-10 Pain Score: 6  Pain Location: back Pain Descriptors / Indicators: Sore Pain Intervention(s): Monitored during session    Home Living                      Prior Function  PT Goals (current goals can now be found in the care plan section) Progress towards PT goals: Progressing toward goals    Frequency    Min 5X/week      PT Plan Current plan remains appropriate    Co-evaluation              AM-PAC PT "6 Clicks" Mobility   Outcome Measure  Help needed turning from your back to your side while in a flat bed without  using bedrails?: A Little Help needed moving from lying on your back to sitting on the side of a flat bed without using bedrails?: A Little Help needed moving to and from a bed to a chair (including a wheelchair)?: A Little Help needed standing up from a chair using your arms (e.g., wheelchair or bedside chair)?: A Little Help needed to walk in hospital room?: A Little Help needed climbing 3-5 steps with a railing? : A Little 6 Click Score: 18    End of Session Equipment Utilized During Treatment: Gait belt;Back brace Activity Tolerance: Patient tolerated treatment well Patient left: in chair;with call bell/phone within reach;with chair alarm set Nurse Communication: Mobility status PT Visit Diagnosis: Difficulty in walking, not elsewhere classified (R26.2)     Time: 4580-9983 PT Time Calculation (min) (ACUTE ONLY): 27 min  Charges:  $Gait Training: 23-37 mins                     Kittie Plater, PT, DPT Acute Rehabilitation Services Pager #: [phone] Office #: [phone]    Berline Lopes 08/08/2019, 10:07 AM

## 2019-08-08 NOTE — Progress Notes (Signed)
Subjective: Patient reports doing better today.  Objective: Vital signs in last 24 hours: Temp:  [97.7 F (36.5 C)-98.6 F (37 C)] 97.7 F (36.5 C) (05/31 1117) Pulse Rate:  [75-88] 76 (05/31 1117) Resp:  [13-20] 13 (05/31 1117) BP: (116-126)/(49-66) 117/49 (05/31 1117) SpO2:  [93 %-99 %] 99 % (05/31 1117)  Intake/Output from previous day: 05/30 0701 - 05/31 0700 In: -  Out: 85 [Drains:85] Intake/Output this shift: Total I/O In: 300 [P.O.:300] Out: -   Physical Exam: Up OOB ambulating.  Dressings CDI.    Lab Results: Recent Labs    08/06/19 0559 08/07/19 0908  WBC 7.9 7.1  HGB 7.8* 10.9*  HCT 25.2* 34.1*  PLT 200 156   BMET Recent Labs    08/05/19 1516 08/08/19 0920  NA 141 139  K 4.2 4.0  CL 106 107  CO2  --  23  GLUCOSE 174* 88  BUN 19 11  CREATININE 0.60 0.84  CALCIUM  --  9.4    Studies/Results: ECHOCARDIOGRAM COMPLETE  Result Date: 08/07/2019    ECHOCARDIOGRAM REPORT   Patient Name:   Colleen Zuniga Date of Exam: 08/07/2019 Medical Rec #:  [id number]         Height:       62.0 in Accession #:    [id number]        Weight:       193.0 lb Date of Birth:  Mar 29, 1960        BSA:          1.883 m Patient Age:    59 years          BP:           145/64 mmHg Patient Gender: F                 HR:           92 bpm. Exam Location:  Inpatient Procedure: 2D Echo Indications:    Chest Pain 786.50 / R07.9  History:        Patient has no prior history of Echocardiogram examinations.                 Signs/Symptoms:Chest Pain and Murmur; Risk Factors:Former Smoker                 and Diabetes. Pseudoarthrosis of lumbar spine, Hypokalemia.  Sonographer:    Leavy Cella Referring Phys: [place]  1. Normal LV function; mild TR; mild pulmonary hypertension.  2. Left ventricular ejection fraction, by estimation, is 60 to 65%. The left ventricle has normal function. The left ventricle has no regional wall motion abnormalities. Left ventricular  diastolic parameters were normal.  3. Right ventricular systolic function is normal. The right ventricular size is normal. There is mildly elevated pulmonary artery systolic pressure.  4. The mitral valve is normal in structure. No evidence of mitral valve regurgitation. No evidence of mitral stenosis.  5. The aortic valve is tricuspid. Aortic valve regurgitation is not visualized. Mild aortic valve sclerosis is present, with no evidence of aortic valve stenosis.  6. The inferior vena cava is normal in size with greater than 50% respiratory variability, suggesting right atrial pressure of 3 mmHg. FINDINGS  Left Ventricle: Left ventricular ejection fraction, by estimation, is 60 to 65%. The left ventricle has normal function. The left ventricle has no regional wall motion abnormalities. The left ventricular internal cavity size was normal in size. There is  no left ventricular  hypertrophy. Left ventricular diastolic parameters were normal. Right Ventricle: The right ventricular size is normal. Right ventricular systolic function is normal. There is mildly elevated pulmonary artery systolic pressure. The tricuspid regurgitant velocity is 3.12 m/s, and with an assumed right atrial pressure of 3 mmHg, the estimated right ventricular systolic pressure is 41.9 mmHg. Left Atrium: Left atrial size was normal in size. Right Atrium: Right atrial size was normal in size. Pericardium: There is no evidence of pericardial effusion. Mitral Valve: The mitral valve is normal in structure. Normal mobility of the mitral valve leaflets. No evidence of mitral valve regurgitation. No evidence of mitral valve stenosis. Tricuspid Valve: The tricuspid valve is normal in structure. Tricuspid valve regurgitation is mild . No evidence of tricuspid stenosis. Aortic Valve: The aortic valve is tricuspid. Aortic valve regurgitation is not visualized. Mild aortic valve sclerosis is present, with no evidence of aortic valve stenosis. Aortic valve  mean gradient measures 9.0 mmHg. Aortic valve peak gradient measures 17.8 mmHg. Pulmonic Valve: The pulmonic valve was normal in structure. Pulmonic valve regurgitation is trivial. No evidence of pulmonic stenosis. Aorta: The aortic root is normal in size and structure. Venous: The inferior vena cava is normal in size with greater than 50% respiratory variability, suggesting right atrial pressure of 3 mmHg. IAS/Shunts: No atrial level shunt detected by color flow Doppler. Additional Comments: Normal LV function; mild TR; mild pulmonary hypertension.  LEFT VENTRICLE PLAX 2D LVIDd:         4.07 cm  Diastology LVIDs:         2.37 cm  LV e' lateral:   9.79 cm/s LV PW:         1.05 cm  LV E/e' lateral: 10.6 LV IVS:        0.83 cm  LV e' medial:    8.16 cm/s LVOT diam:     1.70 cm  LV E/e' medial:  12.7 LVOT Area:     2.27 cm  RIGHT VENTRICLE RV S prime:     22.60 cm/s TAPSE (M-mode): 2.5 cm LEFT ATRIUM             Index       RIGHT ATRIUM           Index LA diam:        3.40 cm 1.81 cm/m  RA Area:     15.30 cm LA Vol (A2C):   37.7 ml 20.02 ml/m RA Volume:   42.40 ml  22.52 ml/m LA Vol (A4C):   39.8 ml 21.14 ml/m LA Biplane Vol: 39.7 ml 21.08 ml/m  AORTIC VALVE AV Vmax:      211.00 cm/s AV Vmean:     139.000 cm/s AV VTI:       0.395 m AV Peak Grad: 17.8 mmHg AV Mean Grad: 9.0 mmHg  AORTA Ao Root diam: 2.70 cm MITRAL VALVE                TRICUSPID VALVE MV Area (PHT): 3.99 cm     TR Peak grad:   38.9 mmHg MV Decel Time: 190 msec     TR Vmax:        312.00 cm/s MV E velocity: 104.00 cm/s MV A velocity: 106.00 cm/s  SHUNTS MV E/A ratio:  0.98         Systemic Diam: 1.70 cm Olga Millers MD Electronically signed by Olga Millers MD Signature Date/Time: 08/07/2019/11:03:12 AM    Final     Assessment/Plan: Patient is improving.  Discontinue drain.  D/C Vancomycin and IV, which is irritating patient.    LOS: 3 days    Dorian Heckle, MD 08/08/2019, 2:14 PM

## 2019-08-08 NOTE — Progress Notes (Addendum)
1923: Dayshift RN was advised to restart Vanc at slower rate. Will monitor pt for any adverse reactions.   2202: Pt tolerated completion of Vanc without any other complaints or symptoms.   8264: Pt's hemovac had output of 65mL.

## 2019-08-08 NOTE — Evaluation (Signed)
Occupational Therapy Evaluation Patient Details Name: Colleen Zuniga MRN: [id number] DOB: [birth year] Today's Date: 08/08/2019    History of Present Illness 59 year old female with longstanding history of back trouble previously undergone an L2-S1 fusion however pseudoarthrosis L5-S1 and has had progressive degenerative collapse and scoliotic deformity at L1-L2. Pt underwent revision of L5-S1 fusion with pelvic fixation and interbody fusion L1-2. PMH: Type I DM. de quervain's tenosynovitis, R. PSH: has had previous back surgeries.   Clinical Impression   Pt admitted with the above diagnosis and demonstrates the below deficits.  She currently requires set up to mod A for ADLs and min guard assist for functional mobility.  She was instructed in use of AE for LB ADLs and safety/precautions with ADLs and IADLs  She demonstrates good safety awareness.  All education completed. OT will sign offl.     Follow Up Recommendations  No OT follow up;Supervision - Intermittent    Equipment Recommendations  None recommended by OT    Recommendations for Other Services       Precautions / Restrictions Precautions Precautions: Fall;Back Precaution Booklet Issued: Yes (comment) Precaution Comments: went over handout, pt recalled most of them from previous surgeries. Watch BP, dropped significantly Required Braces or Orthoses: Spinal Brace Spinal Brace: Lumbar corset;Applied in sitting position Restrictions Weight Bearing Restrictions: No      Mobility Bed Mobility Overal bed mobility: Needs Assistance Bed Mobility: Rolling;Sidelying to Sit Rolling: Supervision Sidelying to sit: Supervision          Transfers Overall transfer level: Needs assistance Equipment used: None Transfers: Sit to/from Stand;Stand Pivot Transfers Sit to Stand: Min guard Stand pivot transfers: Min guard       General transfer comment: moves slowly     Balance Overall balance assessment: Needs  assistance Sitting-balance support: No upper extremity supported Sitting balance-Leahy Scale: Good     Standing balance support: No upper extremity supported Standing balance-Leahy Scale: Fair                             ADL either performed or assessed with clinical judgement   ADL Overall ADL's : Needs assistance/impaired Eating/Feeding: Independent   Grooming: Wash/dry hands;Wash/dry face;Oral care;Brushing hair;Min guard;Standing Grooming Details (indicate cue type and reason): Pt instructed in safe technique  Upper Body Bathing: Supervision/ safety;Set up;Standing   Lower Body Bathing: Minimal assistance;Sit to/from stand Lower Body Bathing Details (indicate cue type and reason): discussed use of LH brush and she verbalized understanding  Upper Body Dressing : Supervision/safety;Set up;Sitting   Lower Body Dressing: Sit to/from stand;Moderate assistance Lower Body Dressing Details (indicate cue type and reason): Pt has a reacher.  She was instructed how to don pants using a reacher and was able to verbalize understanding.  She reports she likley will not wear socks over the summer, however, she was instructed in use and acquisition of sock aid  Toilet Transfer: Min guard;Ambulation;Comfort height toilet   Toileting- Clothing Manipulation and Hygiene: Min guard;Sit to/from stand Toileting - Clothing Manipulation Details (indicate cue type and reason): Pt instructed in use and acquisition of toileting aid    Tub/Shower Transfer Details (indicate cue type and reason): Pt reports she will have spouse assist her at discharge and won't attempt without assist  Functional mobility during ADLs: Min guard General ADL Comments: unable to perform figure 4 reviewed safety with IADLs      Vision         Perception  Praxis      Pertinent Vitals/Pain Pain Assessment: Faces Faces Pain Scale: Hurts a little bit Pain Location: back Pain Descriptors / Indicators:  Sore Pain Intervention(s): Monitored during session;Patient requesting pain meds-RN notified     Hand Dominance Right   Extremity/Trunk Assessment Upper Extremity Assessment Upper Extremity Assessment: Overall WFL for tasks assessed   Lower Extremity Assessment Lower Extremity Assessment: Generalized weakness   Cervical / Trunk Assessment Cervical / Trunk Assessment: Other exceptions Cervical / Trunk Exceptions: back surgery   Communication Communication Communication: No difficulties   Cognition Arousal/Alertness: Awake/alert Behavior During Therapy: WFL for tasks assessed/performed Overall Cognitive Status: Within Functional Limits for tasks assessed                                     General Comments  Pt demonstrates good understanding of precautions     Exercises     Shoulder Instructions      Home Living Family/patient expects to be discharged to:: Private residence Living Arrangements: Spouse/significant other Available Help at Discharge: Family;Available 24 hours/day Type of Home: House Home Access: Stairs to enter [employer] of Steps: 3 without HR, or 5 with one handrail Entrance Stairs-Rails: Right;Left Home Layout: Two level Alternate Level Stairs-Number of Steps: flight Alternate Level Stairs-Rails: Right Bathroom Shower/Tub: Tub/shower unit(garden tub )   Bathroom Toilet: Standard     Home Equipment: Environmental consultant - 2 wheels;Bedside commode;Toilet riser;Adaptive equipment Adaptive Equipment: Reacher Additional Comments: lives with spouse       Prior Functioning/Environment Level of Independence: Independent        Comments: pt was amb without AD, indep with ADLs and IADLs        OT Problem List: Impaired balance (sitting and/or standing);Decreased activity tolerance;Decreased safety awareness;Decreased knowledge of use of DME or AE;Decreased knowledge of precautions;Pain      OT Treatment/Interventions:      OT  Goals(Current goals can be found in the care plan section) Acute Rehab OT Goals Patient Stated Goal: to be able to take care of self  OT Goal Formulation: All assessment and education complete, DC therapy  OT Frequency:     Barriers to D/C:            Co-evaluation              AM-PAC OT "6 Clicks" Daily Activity     Outcome Measure Help from another person eating meals?: None Help from another person taking care of personal grooming?: A Little Help from another person toileting, which includes using toliet, bedpan, or urinal?: A Little Help from another person bathing (including washing, rinsing, drying)?: A Little Help from another person to put on and taking off regular upper body clothing?: A Little Help from another person to put on and taking off regular lower body clothing?: A Lot 6 Click Score: 18   End of Session Equipment Utilized During Treatment: Gait belt;Back brace Nurse Communication: Mobility status  Activity Tolerance: Patient tolerated treatment well Patient left: in bed;with call bell/phone within reach  OT Visit Diagnosis: Pain Pain - part of body: (back )                Time: 1914-7829 OT Time Calculation (min): 26 min Charges:  OT General Charges $OT Visit: 1 Visit OT Evaluation $OT Eval Moderate Complexity: 1 Mod OT Treatments $Self Care/Home Management : 8-22 mins  Tripp Goins C., OTR/L Acute Rehabilitation Services Pager  [phone] Office [phone]   Jeani Hawking M 08/08/2019, 3:00 PM

## 2019-08-08 NOTE — Plan of Care (Signed)

## 2019-08-08 NOTE — Progress Notes (Signed)
Pharmacy Antibiotic Note  Colleen Zuniga is a 59 y.o. female admitted on 08/05/2019 with pseudoarthrosis L5-S1 degenerative scoliosis and deformity L1-L2.  Pt is S/P fusion surgery this afternoon. Pharmacy has been consulted for vancomycin dosing for post-op surgical prophylaxis.  Patient continues with drain in place post-op. Patient initially reported itching with no systemic symptoms. Patient tolerating vancomycin now at slower rate.  SCr 0.6 from 5/28. Will repeat labs today.   Plan: Vancomycin 1 gm IV Q 12 hrs (goal trough: 10-15 mg/L) Monitor clinical progress, c/s, renal function F/u de-escalation plan/LOT, vancomycin trough soon if continuing   Height: 5\' 2"  (157.5 cm) Weight: 87.5 kg (193 lb) IBW/kg (Calculated) : 50.1  Temp (24hrs), Avg:98.3 F (36.8 C), Min:97.8 F (36.6 C), Max:98.6 F (37 C)  Recent Labs  Lab 08/05/19 0847 08/05/19 1200 08/05/19 1332 08/05/19 1430 08/05/19 1516 08/06/19 0559 08/07/19 0908  WBC  --   --   --   --   --  7.9 7.1  CREATININE 0.70 0.60 0.70 0.60 0.60  --   --     Estimated Creatinine Clearance: 76.9 mL/min (by C-G formula based on SCr of 0.6 mg/dL).    Allergies  Allergen Reactions  . Penicillins Anaphylaxis and Other (See Comments)    CLOSES THROAT PATIENT HAD A PCN REACTION WITH IMMEDIATE RASH, FACIAL/TONGUE/THROAT SWELLING, SOB, OR LIGHTHEADEDNESS WITH HYPOTENSION:  #  #  #  YES  #  #  #   Has patient had a PCN reaction causing severe rash involving mucus membranes or skin necrosis: UNKNOWN  Has patient had a PCN reaction that required hospitalization:No Has patient had a PCN reaction occurring within the last 10 years:No   . Hydrocodone-Acetaminophen     Nausea/vomiting with or without food  . Adhesive [Tape] Rash    Itching    Microbiology results: 5/20 MRSA PCR: negative 5/25 COVID: negative  6/25, PharmD, BCPS Please check AMION for all [facility] contact numbers Clinical Pharmacist 08/08/2019  8:47 AM

## 2019-08-09 ENCOUNTER — Encounter: Payer: Self-pay | Admitting: *Deleted

## 2019-08-09 LAB — GLUCOSE, CAPILLARY
Glucose-Capillary: 123 mg/dL — ABNORMAL HIGH (ref 70–99)
Glucose-Capillary: 163 mg/dL — ABNORMAL HIGH (ref 70–99)

## 2019-08-09 LAB — VANCOMYCIN, TROUGH: Vancomycin Tr: 5 ug/mL — ABNORMAL LOW (ref 15–20)

## 2019-08-09 MED ORDER — OXYCODONE-ACETAMINOPHEN 10-325 MG PO TABS: 1.0000 | ORAL_TABLET | Freq: Four times a day (QID) | ORAL | 0 refills | Status: AC | PRN

## 2019-08-09 MED ORDER — DOCUSATE SODIUM 100 MG PO CAPS
100.0000 mg | ORAL_CAPSULE | Freq: Two times a day (BID) | ORAL | Status: DC
  Administered 2019-08-09: 100 mg via ORAL
  Filled 2019-08-09: qty 1

## 2019-08-09 MED ORDER — POLYETHYLENE GLYCOL 3350 17 G PO PACK: 17.0000 g | PACK | Freq: Every day | ORAL | Status: DC

## 2019-08-09 NOTE — Plan of Care (Signed)
Patient will be discharged on home health today

## 2019-08-09 NOTE — Progress Notes (Signed)
Patient is doing well and will be discharged today

## 2019-08-09 NOTE — TOC Transition Note (Signed)
Transition of Care [facility]) - CM/SW Discharge Note Donn Pierini RN,BSN Transitions of Care Unit 4NP (non trauma) - RN Case Manager [phone]   Patient Details  Name: Colleen Zuniga MRN: [id number] Date of Birth: Jul 17, 1960  Transition of Care [facility]) CM/SW Contact:  Darrold Span, RN Phone Number: 08/09/2019, 2:49 PM   Clinical Narrative:    Pt stable for transition home today, HH has been set up with [facility] and order placed for HHPT. Notified Cory with [place] for start of care. Pt has no DME needs. Has transportation home, no further TOC needs noted.    Final next level of care: Home w Home Health Services Barriers to Discharge: Barriers Resolved   Patient Goals and CMS Choice Patient states their goals for this hospitalization and ongoing recovery are:: to get home CMS Medicare.gov Compare Post Acute Care list provided to:: Patient Choice offered to / list presented to : Patient  Discharge Placement                 Home with [facility]      Discharge Plan and Services     Post Acute Care Choice: Home Health          DME Arranged: N/A DME Agency: NA       HH Arranged: PT HH Agency: [facility] Health Care Date [facility] Agency Contacted: 08/07/19 Time HH Agency Contacted: [phone]    Social Determinants of Health (SDOH) Interventions     Readmission Risk Interventions No flowsheet data found.

## 2019-08-09 NOTE — Discharge Summary (Addendum)
Physician Discharge Summary  Patient ID: Colleen Zuniga MRN: [id number] DOB/AGE: 11-08-1960 59 y.o. Estimated body mass index is 35.3 kg/m as calculated from the following:   Height as of this encounter: 5\' 2"  (1.575 m).   Weight as of this encounter: 87.5 kg.   Admit date: 08/05/2019 Discharge date: 08/09/2019  Admission Diagnoses:pseudoarthrosis L5-S1 degenerative scoliosis L1-L2  Discharge Diagnoses:  same Active Problems:   Pseudoarthrosis of lumbar spine   Discharged Condition: good  Hospital Course:  Patient was admitted the hospital underwent anterolateral interbody fusion L1-L2 and posterior revision of surgery for pseudoarthrosis L5-S1 postoperatively patient did very well recovering the floor on the floor was ambulating and voiding spontaneously had some difficulty with chest pain and anemia which resolved with transfusion and chest pain worked up with an echocardiogram.  Have not heard cardiology's interpretation however report of the echocardiogram overall looked okay I think.  We will follow-up.  Patient looks like she is stable for discharge will discuss with cardiology will see if we can get her bowels working letter work with Physical therapy this morning and if she feels up to it letter go home this afternoon.  Consults: Significant Diagnostic Studies: Treatments:  Anterolateral interbody fusion L1-L2 posterior segmental fixation L1 to the pelvis and blood transfusion Discharge Exam: Blood pressure 118/60, pulse 80, temperature 98.2 F (36.8 C), temperature source Oral, resp. rate 13, height 5\' 2"  (1.575 m), weight 87.5 kg, SpO2 100 %.  strength 5/5 1 clean dry and intact  Disposition:  home  Discharge Instructions    Call MD for:  difficulty breathing, headache or visual disturbances   Complete by: As directed    Call MD for:  hives   Complete by: As directed    Call MD for:  persistant nausea and vomiting   Complete by: As directed    Call MD for:   redness, tenderness, or signs of infection (pain, swelling, redness, odor or green/yellow discharge around incision site)   Complete by: As directed    Call MD for:  severe uncontrolled pain   Complete by: As directed    Call MD for:  temperature >100.4   Complete by: As directed    Diet - low sodium heart healthy   Complete by: As directed    Face-to-face encounter (required for Medicare/Medicaid patients)   Complete by: As directed    I CRAM,GARY P certify that this patient is under my care and that I, or a nurse practitioner or physician's assistant working with me, had a face-to-face encounter that meets the physician face-to-face encounter requirements with this patient on 08/09/2019. The encounter with the patient was in whole, or in part for the following medical condition(s) which is the primary reason for home health care (List medical condition):  Scoliosis pseudoarthrosis   The encounter with the patient was in whole, or in part, for the following medical condition, which is the primary reason for home health care: Pseudoarthrosis L5-S1 scoliotic deformity Y3-K1   I certify that, based on my findings, the following services are medically necessary home health services: Physical therapy   Reason for Medically Necessary Home Health Services: Therapy- Therapeutic Exercises to Increase Strength and Endurance   My clinical findings support the need for the above services: Unable to leave home safely without assistance and/or assistive device   Further, I certify that my clinical findings support that this patient is homebound due to: Unable to leave home safely without assistance   Home Health  Complete by: As directed    To provide the following care/treatments: PT   Increase activity slowly   Complete by: As directed      Allergies as of 08/09/2019      Reactions   Penicillins Anaphylaxis, Other (See Comments)   CLOSES THROAT PATIENT HAD A PCN REACTION WITH IMMEDIATE RASH,  FACIAL/TONGUE/THROAT SWELLING, SOB, OR LIGHTHEADEDNESS WITH HYPOTENSION:  #  #  #  YES  #  #  #   Has patient had a PCN reaction causing severe rash involving mucus membranes or skin necrosis: UNKNOWN  Has patient had a PCN reaction that required hospitalization:No Has patient had a PCN reaction occurring within the last 10 years:No   Hydrocodone-acetaminophen    Nausea/vomiting with or without food   Adhesive [tape] Rash   Itching      Medication List    TAKE these medications   acetaminophen 325 MG tablet Commonly known as: TYLENOL Take 325 mg by mouth 3 (three) times daily.   albuterol 108 (90 Base) MCG/ACT inhaler Commonly known as: VENTOLIN HFA Inhale 2 puffs into the lungs every 4 (four) hours as needed.   ALPRAZolam 0.5 MG tablet Commonly known as: XANAX Take 0.25-0.5 mg by mouth at bedtime as needed for sleep or anxiety.   atorvastatin 10 MG tablet Commonly known as: LIPITOR Take 10 mg by mouth daily after supper.   Baqsimi Two Pack 3 MG/DOSE Powd Generic drug: Glucagon Place 3 mg into the nose once.   buPROPion 300 MG 24 hr tablet Commonly known as: WELLBUTRIN XL Take 300 mg by mouth daily before breakfast.   CALCIUM-VITAMIN D PO Take 500 mg by mouth 2 (two) times a day.   Continuous Glucose Monitor Devi 1 Device by Other route continuous.   etodolac 500 MG tablet Commonly known as: LODINE Take 500 mg by mouth daily.   fluticasone 50 MCG/ACT nasal spray Commonly known as: FLONASE Place 2 sprays into both nostrils daily.   gabapentin 300 MG capsule Commonly known as: NEURONTIN Take 300 mg by mouth 4 (four) times daily.   insulin aspart 100 UNIT/ML injection Commonly known as: novoLOG Continuous with insulin pump   insulin pump Soln Inject into the skin continuous. Novolog   irbesartan 300 MG tablet Commonly known as: AVAPRO Take 300 mg by mouth daily after breakfast.   levocetirizine 5 MG tablet Commonly known as: XYZAL Take 5 mg by mouth  daily.   levothyroxine 137 MCG tablet Commonly known as: SYNTHROID Take 137 mcg by mouth daily before breakfast.   Magnesium 500 MG Tabs Take 500 mg by mouth daily after breakfast.   metoCLOPramide 10 MG tablet Commonly known as: REGLAN Take 10 mg by mouth daily as needed for nausea.   multivitamin with minerals Tabs tablet Take 1 tablet by mouth daily after breakfast.   ondansetron 4 MG tablet Commonly known as: ZOFRAN Take 1 tablet (4 mg total) by mouth daily as needed for nausea or vomiting.   oxyCODONE-acetaminophen 10-325 MG tablet Commonly known as: Percocet Take 1 tablet by mouth every 6 (six) hours as needed for pain.   potassium gluconate 595 (99 K) MG Tabs tablet Take 595 mg by mouth daily after breakfast.   Systane Complete 0.6 % Soln Generic drug: Propylene Glycol Place 2 drops into both eyes 3 (three) times daily as needed (dry eyes).   vitamin C with rose hips 500 MG tablet Take 500 mg by mouth daily after breakfast.      Follow-up Information  [facility], [facility] Follow up.   Specialty: Home Health Services Why: This agency will contact you to arrange physical therapy visits. Contact information: [address] [place] [address] [phone]           Signed: Tiana Loft [facility] 08/09/2019, 2:23 PM

## 2019-08-09 NOTE — Progress Notes (Signed)
Patient was feeling nauseous and vomited before she left. I gave her zofran before discharge. Explained the importance of taking stool softeners with oxycodone.

## 2019-08-09 NOTE — Progress Notes (Signed)
Patient has home health orders but waiting on discharge orders.

## 2019-08-10 LAB — BPAM RBC
Blood Product Expiration Date: 202106232359
Blood Product Expiration Date: 202106232359
Blood Product Expiration Date: 202106242359
Blood Product Expiration Date: 202106242359
ISSUE DATE / TIME: 202105292020
ISSUE DATE / TIME: 202105300322
Unit Type and Rh: 7300
Unit Type and Rh: 7300
Unit Type and Rh: 7300
Unit Type and Rh: 7300

## 2019-08-10 LAB — TYPE AND SCREEN
ABO/RH(D): B POS
Antibody Screen: NEGATIVE
Unit division: 0
Unit division: 0
Unit division: 0
Unit division: 0

## 2019-08-12 ENCOUNTER — Other Ambulatory Visit (HOSPITAL_COMMUNITY): Payer: Self-pay | Admitting: Neurosurgery

## 2019-08-12 ENCOUNTER — Ambulatory Visit (HOSPITAL_COMMUNITY)
Admission: RE | Admit: 2019-08-12 | Discharge: 2019-08-12 | Disposition: A | Discharge status: Home / Self Care | Payer: Medicare Other | Source: Ambulatory Visit | Attending: Vascular Surgery | Admitting: Vascular Surgery

## 2019-08-12 ENCOUNTER — Other Ambulatory Visit: Payer: Self-pay

## 2019-08-12 DIAGNOSIS — M4155 Other secondary scoliosis, thoracolumbar region: Secondary | ICD-10-CM

## 2019-08-12 LAB — GLUCOSE, CAPILLARY: Glucose-Capillary: 165 mg/dL — ABNORMAL HIGH (ref 70–99)

## 2019-08-12 MED FILL — Sodium Chloride IV Soln 0.9%: INTRAVENOUS | Qty: 1000 | Status: AC

## 2019-08-12 MED FILL — Heparin Sodium (Porcine) Inj 1000 Unit/ML: INTRAMUSCULAR | Qty: 30 | Status: AC

## 2019-08-17 ENCOUNTER — Encounter: Payer: Self-pay | Admitting: *Deleted

## 2020-05-29 IMAGING — CT CT L SPINE W/O CM
3 series · 12 of 33 positions shown, 14 images · non-contrast
Comparison: Radiography 08/03/2018.  CT 07/21/2018.

CLINICAL DATA: Lumbar radiculopathy.

EXAM:
CT LUMBAR SPINE WITHOUT CONTRAST
TECHNIQUE: Multidetector CT imaging of the lumbar spine was performed without
intravenous contrast administration. Multiplanar CT image
reconstructions were also generated.

[Series 5: l spine 2.0 (person_name) (person_name) · axial · 0.39mm/px · z∈[+1042,+1234]mm · 4 of 140 slices shown, 5 images]
[im 22/140  soft-tissue]
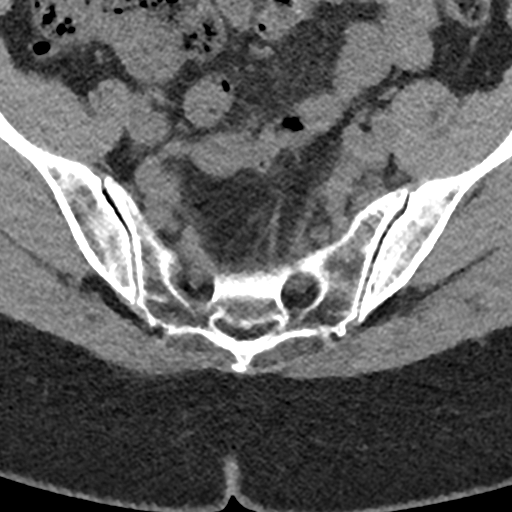
[im 22/140  bone]
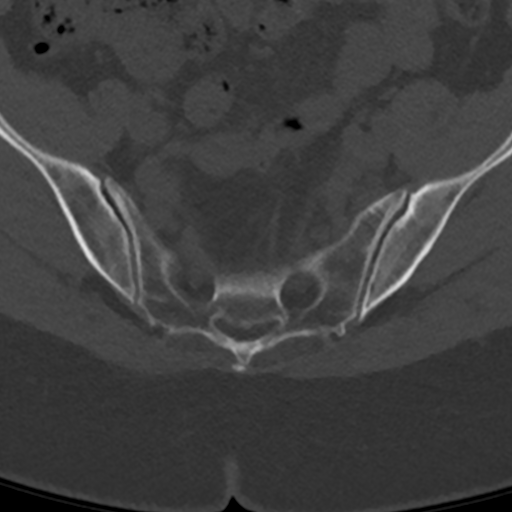
[im 54/140  bone]
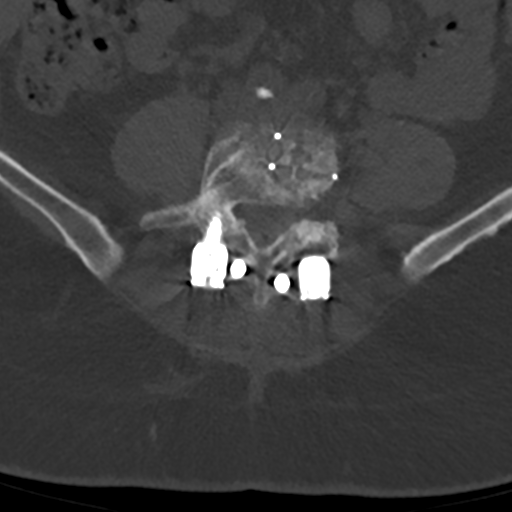
[im 86/140  bone]
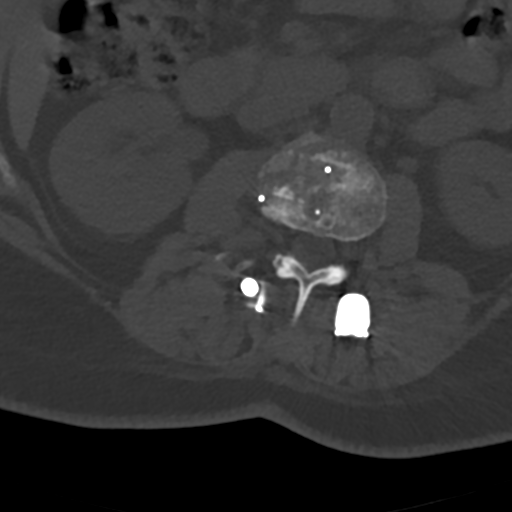
[im 118/140  bone]
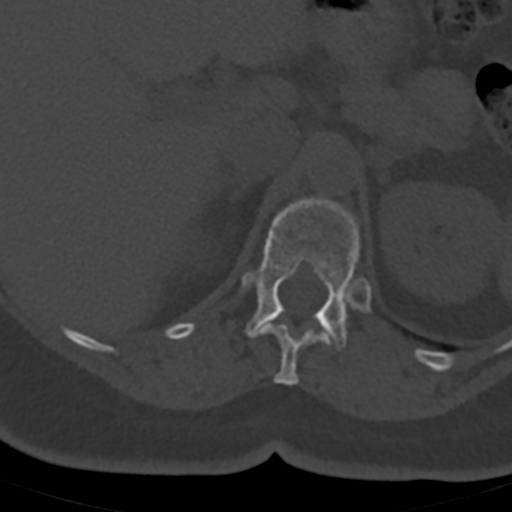

[Series 8: coronal st · coronal · 0.39mm/px · 3 of 78 slices shown]
[im 16/78  bone]
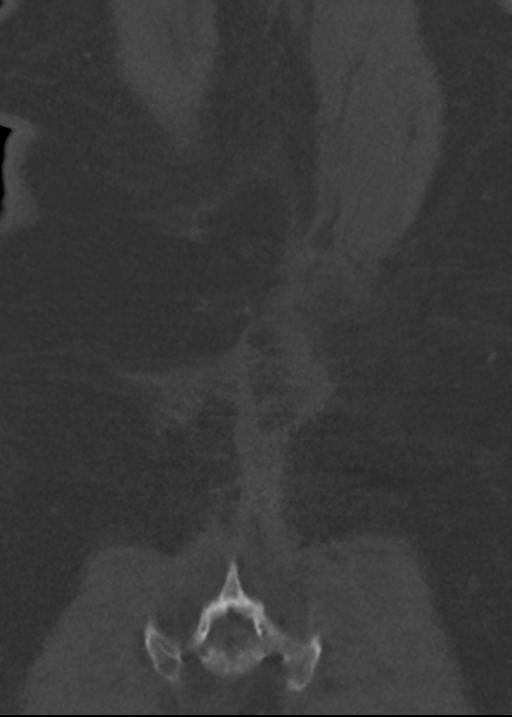
[im 31/78  bone]
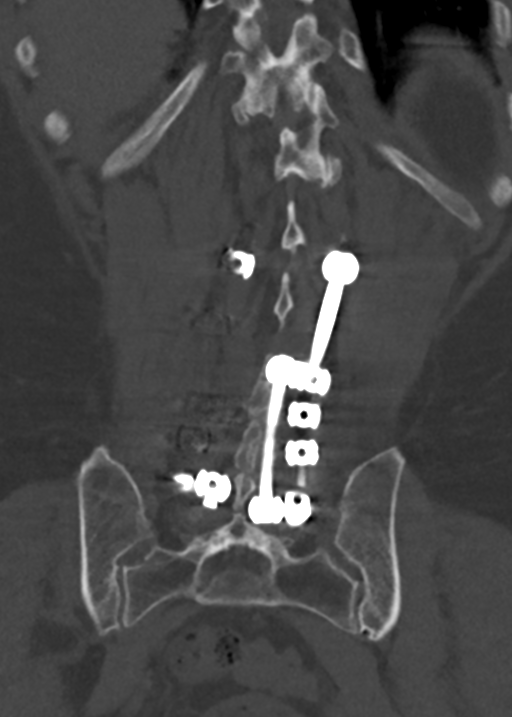
[im 47/78  bone]
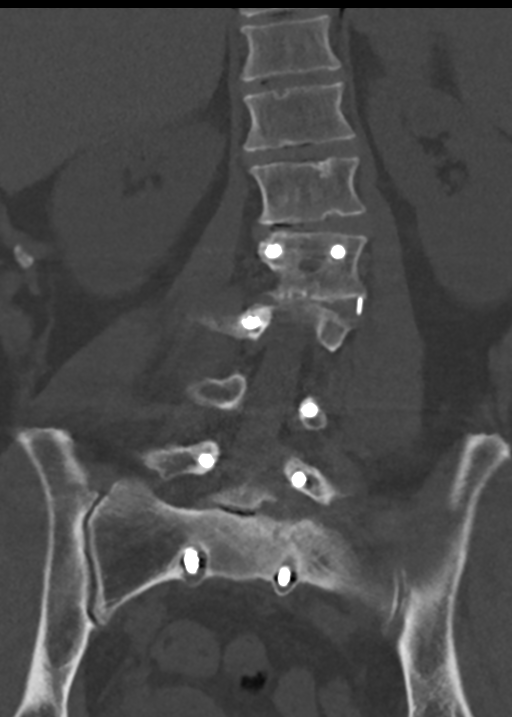

[Series 9: sagittal st · sagittal · 0.39mm/px · 5 of 110 slices shown, 6 images]
[im 37/110  bone]
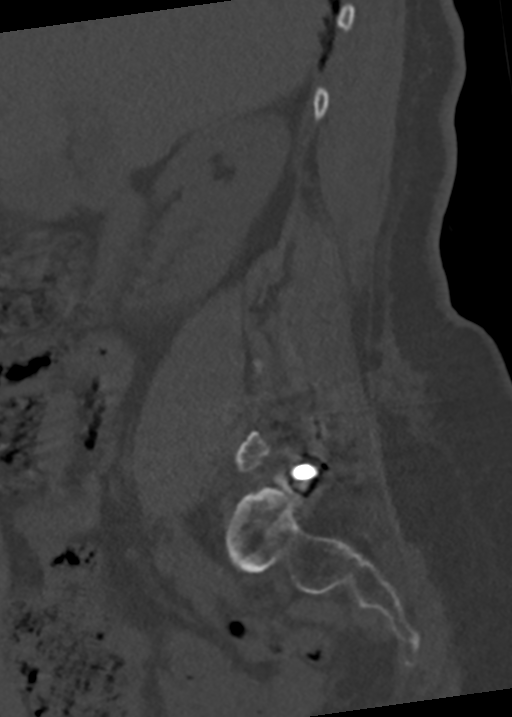
[im 46/110  bone]
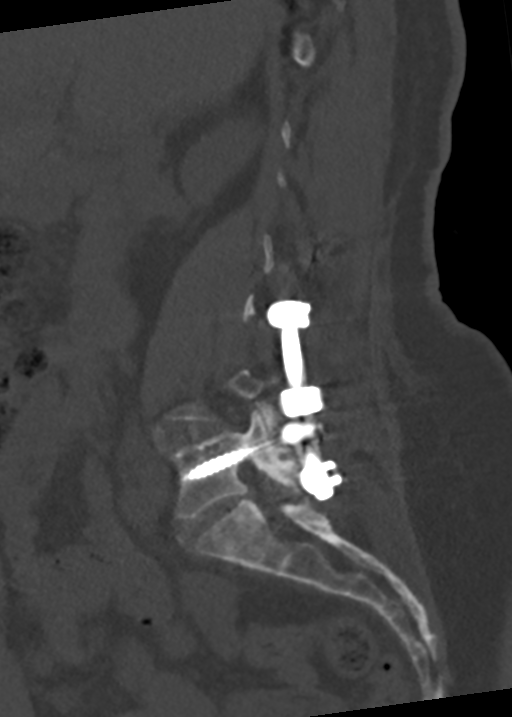
[im 55/110  soft-tissue]
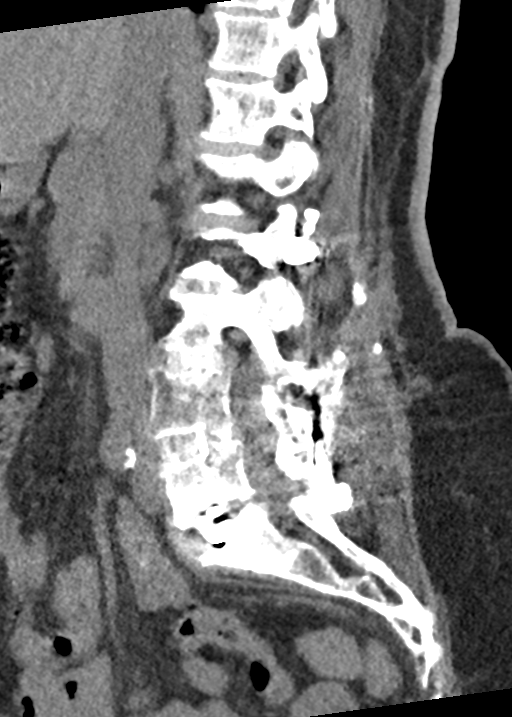
[im 55/110  bone]
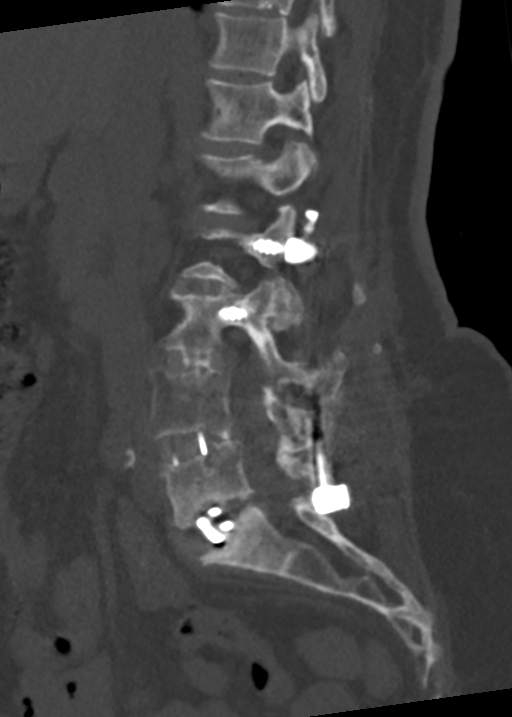
[im 64/110  bone]
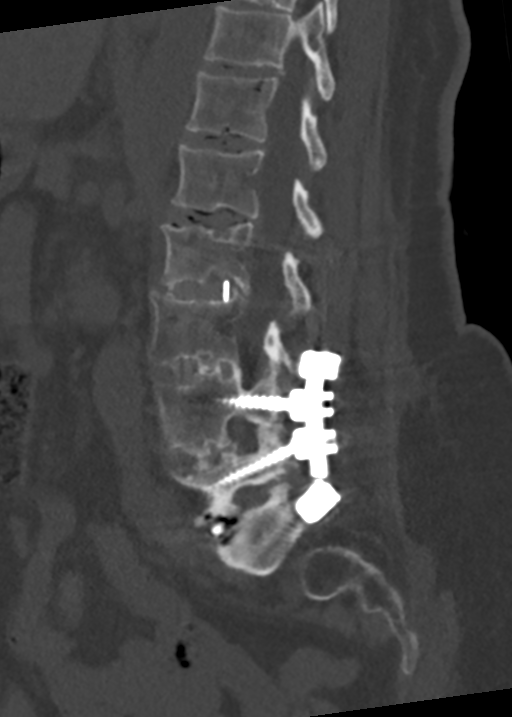
[im 73/110  bone]
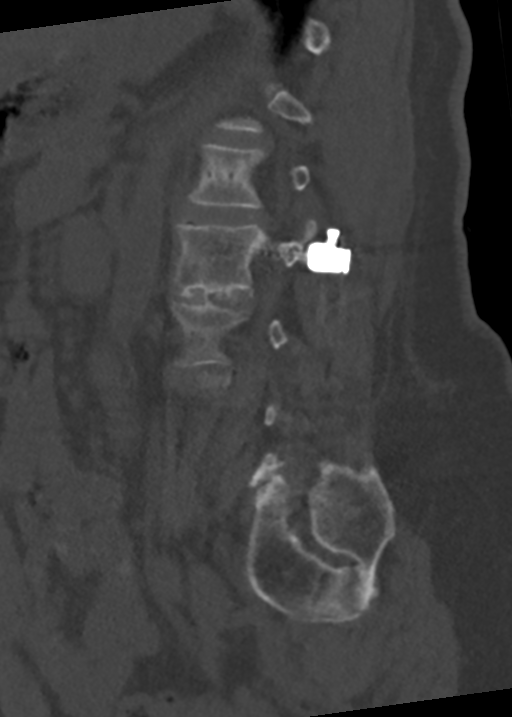

[12 of 33 positions shown; findings below may reference images not displayed]

FINDINGS: Segmentation: 5 lumbar type vertebral bodies as numbered previously.

Alignment: Curvature convex to the left the apex at L2.

Vertebrae: Previous decompression and fusion procedure from L2
through L5 with solid union throughout that region and no
complicating feature. Nonunion at the L5-S1 level with prominent
lucency surrounding the S1 screws on both sides indicating motion.

Paraspinal and other soft tissues: Negative

Disc levels: T12-L1: Mild vacuum phenomenon and bulging of the disc.
No stenosis.

L1-2: Mild vacuum phenomenon and bulging of the disc. Bilateral
facet osteoarthritis. Mild narrowing of the lateral recesses but
without likely neural compression. This level could possibly be a
cause of some pain.

L2 through L5: Previous discectomy and fusion procedures. Fusion is
solid. Sufficient patency of the canal and foramina. No hardware
complication at that region.

L5-S1: Previous extension of the discectomy and fusion procedure.
Nonunion. Subsidence at the endplates of a mild-to-moderate degree.
Nitrogen gas indicating ongoing motion. Pronounced lucency around
the S1 screws indicating ongoing motion. Screw on the left has
eroded the cortex of the S1 foramen. Possible lesser foraminal
erosion on the right.
IMPRESSION: Good appearance in the previous fusion segment from L2 through L5.

Nonunion at the L5-S1 level. Interbody spacer shows endplate
subsidence. Nitrogen gas in the disc space. Pronounced lucency
around the S1 screws, with some erosion of the cortical margins of
the S1 foramina, definite on the left and possible on the right.

Some degenerative changes also at the L1-2 level including bulging
of the disc and facet osteoarthritis which could possibly contribute
to pain.

## 2021-06-16 ENCOUNTER — Emergency Department (HOSPITAL_BASED_OUTPATIENT_CLINIC_OR_DEPARTMENT_OTHER): Payer: Medicare Other

## 2021-06-16 ENCOUNTER — Other Ambulatory Visit: Payer: Self-pay

## 2021-06-16 ENCOUNTER — Emergency Department (HOSPITAL_BASED_OUTPATIENT_CLINIC_OR_DEPARTMENT_OTHER)
Admission: EM | Admit: 2021-06-16 | Discharge: 2021-06-16 | Disposition: A | Discharge status: Home / Self Care | Payer: Medicare Other | Attending: Emergency Medicine | Admitting: Emergency Medicine

## 2021-06-16 DIAGNOSIS — R1110 Vomiting, unspecified: Secondary | ICD-10-CM | POA: Insufficient documentation

## 2021-06-16 DIAGNOSIS — R109 Unspecified abdominal pain: Secondary | ICD-10-CM | POA: Diagnosis not present

## 2021-06-16 DIAGNOSIS — N390 Urinary tract infection, site not specified: Secondary | ICD-10-CM

## 2021-06-16 LAB — CBC
HCT: 37.3 % (ref 36.0–46.0)
Hemoglobin: 12.2 g/dL (ref 12.0–15.0)
MCH: 30.8 pg (ref 26.0–34.0)
MCHC: 32.7 g/dL (ref 30.0–36.0)
MCV: 94.2 fL (ref 80.0–100.0)
Platelets: 211 10*3/uL (ref 150–400)
RBC: 3.96 MIL/uL (ref 3.87–5.11)
RDW: 11.9 % (ref 11.5–15.5)
WBC: 5.8 10*3/uL (ref 4.0–10.5)
nRBC: 0 % (ref 0.0–0.2)

## 2021-06-16 LAB — COMPREHENSIVE METABOLIC PANEL
ALT: 27 U/L (ref 0–44)
AST: 31 U/L (ref 15–41)
Albumin: 4.3 g/dL (ref 3.5–5.0)
Alkaline Phosphatase: 111 U/L (ref 38–126)
Anion gap: 12 (ref 5–15)
BUN: 19 mg/dL (ref 8–23)
CO2: 25 mmol/L (ref 22–32)
Calcium: 10.4 mg/dL — ABNORMAL HIGH (ref 8.9–10.3)
Chloride: 105 mmol/L (ref 98–111)
Creatinine, Ser: 0.62 mg/dL (ref 0.44–1.00)
GFR, Estimated: >60 mL/min (ref >60)
Glucose, Bld: 93 mg/dL (ref 70–99)
Potassium: 4.2 mmol/L (ref 3.5–5.1)
Sodium: 142 mmol/L (ref 135–145)
Total Bilirubin: 0.9 mg/dL (ref 0.3–1.2)
Total Protein: 7.1 g/dL (ref 6.5–8.1)

## 2021-06-16 LAB — URINALYSIS, ROUTINE W REFLEX MICROSCOPIC
Bilirubin Urine: NEGATIVE
Glucose, UA: NEGATIVE mg/dL
Hgb urine dipstick: NEGATIVE
Ketones, ur: >80 mg/dL — AB
Nitrite: NEGATIVE
Protein, ur: 30 mg/dL — AB
Specific Gravity, Urine: 1.028 (ref 1.005–1.030)
pH: 6 (ref 5.0–8.0)

## 2021-06-16 LAB — LIPASE, BLOOD: Lipase: 17 U/L (ref 11–51)

## 2021-06-16 MED ORDER — LACTATED RINGERS IV BOLUS
1000.0000 mL | Freq: Once | INTRAVENOUS | Status: AC
  Administered 2021-06-16: 1000 mL via INTRAVENOUS

## 2021-06-16 MED ORDER — NITROFURANTOIN MONOHYD MACRO 100 MG PO CAPS: 100.0000 mg | ORAL_CAPSULE | Freq: Two times a day (BID) | ORAL | 0 refills | Status: DC

## 2021-06-16 MED ORDER — ONDANSETRON HCL 4 MG/2ML IJ SOLN
4.0000 mg | Freq: Once | INTRAMUSCULAR | Status: AC
  Administered 2021-06-16: 4 mg via INTRAVENOUS
  Filled 2021-06-16: qty 2

## 2021-06-16 MED ORDER — IOHEXOL 300 MG/ML  SOLN
100.0000 mL | Freq: Once | INTRAMUSCULAR | Status: AC | PRN
  Administered 2021-06-16: 100 mL via INTRAVENOUS

## 2021-06-16 MED ORDER — LACTATED RINGERS IV SOLN: INTRAVENOUS | Status: DC

## 2021-06-16 MED ORDER — ONDANSETRON 8 MG PO TBDP: 8.0000 mg | ORAL_TABLET | Freq: Three times a day (TID) | ORAL | 0 refills | Status: AC | PRN

## 2021-06-16 NOTE — ED Notes (Signed)
ED Provider at bedside. 

## 2021-06-16 NOTE — ED Provider Notes (Addendum)
?[facility] ?Provider Note ? ? ?CSN: [id number] ?Arrival date & time: 06/16/21  1302 ? ?  ? ?History ? ?Chief Complaint  ?Patient presents with  ? Emesis  ? ? ?Colleen Zuniga is a 61 y.o. female. ? ?61 year old female presents with 24 hours of abdominal pain with associated bilious vomiting.  Denies any urinary symptoms.  No fever or chills.  Pain has been crampy and in her lower abdomen.  No prior history of same.  Called EMS and was given for Zofran and transported here. ? ? ?  ? ?Home Medications ?Prior to Admission medications   ?Medication Sig Start Date End Date Taking? Authorizing Provider  ?acetaminophen (TYLENOL) 325 MG tablet Take 325 mg by mouth 3 (three) times daily.     [provider]  ?albuterol (VENTOLIN HFA) 108 (90 Base) MCG/ACT inhaler Inhale 2 puffs into the lungs every 4 (four) hours as needed. 04/08/19   [provider]  ?ALPRAZolam Duanne Moron) 0.5 MG tablet Take 0.25-0.5 mg by mouth at bedtime as needed for sleep or anxiety. 05/25/16   [provider]  ?Ascorbic Acid (VITAMIN C WITH ROSE HIPS) 500 MG tablet Take 500 mg by mouth daily after breakfast.     [provider]  ?atorvastatin (LIPITOR) 10 MG tablet Take 10 mg by mouth daily after supper.    [provider]  ?BAQSIMI TWO PACK 3 MG/DOSE POWD Place 3 mg into the nose once. 06/08/19   [provider]  ?buPROPion (WELLBUTRIN XL) 300 MG 24 hr tablet Take 300 mg by mouth daily before breakfast.    [provider]  ?CALCIUM-VITAMIN D PO Take 500 mg by mouth 2 (two) times a day.     [provider]  ?Continuous Glucose Monitor DEVI 1 Device by Other route continuous.    [provider]  ?etodolac (LODINE) 500 MG tablet Take 500 mg by mouth daily.     [provider]  ?fluticasone (FLONASE) 50 MCG/ACT nasal spray Place 2 sprays into both nostrils daily.    [provider]  ?gabapentin (NEURONTIN) 300 MG capsule Take 300 mg  by mouth 4 (four) times daily.     [provider]  ?insulin aspart (NOVOLOG) 100 UNIT/ML injection Continuous with insulin pump    [provider]  ?Insulin Human (INSULIN PUMP) SOLN Inject into the skin continuous. Novolog    [provider]  ?irbesartan (AVAPRO) 300 MG tablet Take 300 mg by mouth daily after breakfast.    [provider]  ?levocetirizine (XYZAL) 5 MG tablet Take 5 mg by mouth daily.    [provider]  ?levothyroxine (SYNTHROID, LEVOTHROID) 137 MCG tablet Take 137 mcg by mouth daily before breakfast.    [provider]  ?Magnesium 500 MG TABS Take 500 mg by mouth daily after breakfast.    [provider]  ?metoCLOPramide (REGLAN) 10 MG tablet Take 10 mg by mouth daily as needed for nausea.    [provider]  ?Multiple Vitamin (MULTIVITAMIN WITH MINERALS) TABS tablet Take 1 tablet by mouth daily after breakfast.    [provider]  ?ondansetron (ZOFRAN) 4 MG tablet Take 1 tablet (4 mg total) by mouth daily as needed for nausea or vomiting. ?Patient not taking: Reported on 07/25/2019 02/11/19   Milus Banister, MD  ?potassium gluconate 595 (99 K) MG TABS tablet Take 595 mg by mouth daily after breakfast.    [provider]  ?Propylene Glycol (SYSTANE COMPLETE) 0.6 %  SOLN Place 2 drops into both eyes 3 (three) times daily as needed (dry eyes).    [provider]  ?   ? ?Allergies    ?Penicillins, Hydrocodone-acetaminophen, and Adhesive [tape]   ? ?Review of Systems   ?Review of Systems  ?All other systems reviewed and are negative. ? ?Physical Exam ?Updated Vital Signs ?BP 129/62   Pulse 91   Temp 99 ?F (37.2 ?C) (Oral)   Resp 18   Ht 1.575 m (5\' 2" )   Wt 83.9 kg   LMP  (LMP Unknown)   SpO2 98%   BMI 33.84 kg/m?  ?Physical Exam ?Vitals and nursing note reviewed.  ?Constitutional:   ?   General: She is not in acute distress. ?   Appearance: Normal appearance. She is well-developed. She is not  toxic-appearing.  ?HENT:  ?   Head: Normocephalic and atraumatic.  ?Eyes:  ?   General: Lids are normal.  ?   Conjunctiva/sclera: Conjunctivae normal.  ?   Pupils: Pupils are equal, round, and reactive to light.  ?Neck:  ?   Thyroid: No thyroid mass.  ?   Trachea: No tracheal deviation.  ?Cardiovascular:  ?   Rate and Rhythm: Normal rate and regular rhythm.  ?   Heart sounds: Normal heart sounds. No murmur heard. ?  No gallop.  ?Pulmonary:  ?   Effort: Pulmonary effort is normal. No respiratory distress.  ?   Breath sounds: Normal breath sounds. No stridor. No decreased breath sounds, wheezing, rhonchi or rales.  ?Abdominal:  ?   General: There is no distension.  ?   Palpations: Abdomen is soft.  ?   Tenderness: There is no abdominal tenderness. There is no rebound.  ? ? ?Musculoskeletal:     ?   General: No tenderness. Normal range of motion.  ?   Cervical back: Normal range of motion and neck supple.  ?Skin: ?   General: Skin is warm and dry.  ?   Findings: No abrasion or rash.  ?Neurological:  ?   Mental Status: She is alert and oriented to person, place, and time. Mental status is at baseline.  ?   GCS: GCS eye subscore is 4. GCS verbal subscore is 5. GCS motor subscore is 6.  ?   Cranial Nerves: No cranial nerve deficit.  ?   Sensory: No sensory deficit.  ?   Motor: Motor function is intact.  ?Psychiatric:     ?   Attention and Perception: Attention normal.     ?   Speech: Speech normal.     ?   Behavior: Behavior normal.  ? ? ?ED Results / Procedures / Treatments   ?Labs ?(all labs ordered are listed, but only abnormal results are displayed) ?Labs Reviewed  ?COMPREHENSIVE METABOLIC PANEL - Abnormal; Notable for the following components:  ?    Result Value  ? Calcium 10.4 (*)   ? All other components within normal limits  ?URINALYSIS, ROUTINE W REFLEX MICROSCOPIC - Abnormal; Notable for the following components:  ? Ketones, ur >80 (*)   ? Protein, ur 30 (*)   ? Leukocytes,Ua SMALL (*)   ? All other components  within normal limits  ?LIPASE, BLOOD  ?CBC  ? ? ?EKG ?None ? ?Radiology ?No results found. ? ?Procedures ?Procedures  ? ? ?Medications Ordered in ED ?Medications  ?ondansetron (ZOFRAN) injection 4 mg (4 mg Intravenous Given 06/16/21 1655)  ? ? ?ED Course/ Medical Decision Making/ A&P ?  ?                        ?  Medical Decision Making ?Amount and/or Complexity of Data Reviewed ?Labs: ordered. ?Radiology: ordered. ? ?Risk ?Prescription drug management. ? ? ?Patient presented with abdominal pain and emesis.  Concern for possible obstruction.  Emesis was treated with IV Zofran and she was given IV fluids here.  Abdominal CT performed which per my review and interpretation shows no signs of obstruction.  She has no leukocytosis on her CBC.  Her electrolytes are within normal limits.  Her urinalysis is consistent with dehydration and possible UTI.  Patient will be placed on Macrobid for treatment of her UTI.  Patient has no evidence of pyelonephritis.  He is able to take oral liquids here.  Plan discussed with patient and her relative in the room and they are in agreement to this ? ? ? ? ? ? ? ?Final Clinical Impression(s) / ED Diagnoses ?Final diagnoses:  ?None  ? ? ?Rx / DC Orders ?ED Discharge Orders   ? ? None  ? ?  ? ? ?  ?Lacretia Leigh, MD ?06/16/21 1943 ? ?  ?Lacretia Leigh, MD ?06/16/21 1945 ? ?

## 2021-06-16 NOTE — ED Triage Notes (Signed)
Pt arrived via [facility] from home. Per EMS, pt began having N/V onset 1am last night. Pt caox4 and ambulatory with EMS. No active vomiting with EMS, pt c/o nausea while in EMS care and 4mg  Zofran admin by EMS.  ? ?VS ?BP 162/64 ?HR 100 ?SpO2 100%  ?CBG 163 ?GCS 15 ? ?20g IV L AC  ?4mg  zofran IV  ?

## 2021-08-07 DIAGNOSIS — Z0289 Encounter for other administrative examinations: Secondary | ICD-10-CM

## 2021-09-17 ENCOUNTER — Encounter (INDEPENDENT_AMBULATORY_CARE_PROVIDER_SITE_OTHER): Payer: Self-pay | Admitting: Bariatrics

## 2021-09-17 ENCOUNTER — Ambulatory Visit (INDEPENDENT_AMBULATORY_CARE_PROVIDER_SITE_OTHER): Payer: Medicare Other | Admitting: Bariatrics

## 2021-09-17 VITALS — BP 127/78 | HR 74 | Temp 97.5°F | Ht 62.0 in | Wt 190.0 lb

## 2021-09-17 DIAGNOSIS — E038 Other specified hypothyroidism: Secondary | ICD-10-CM

## 2021-09-17 DIAGNOSIS — Z6834 Body mass index (BMI) 34.0-34.9, adult: Secondary | ICD-10-CM

## 2021-09-17 DIAGNOSIS — E782 Mixed hyperlipidemia: Secondary | ICD-10-CM

## 2021-09-17 DIAGNOSIS — E668 Other obesity: Secondary | ICD-10-CM

## 2021-09-17 DIAGNOSIS — E559 Vitamin D deficiency, unspecified: Secondary | ICD-10-CM

## 2021-09-17 DIAGNOSIS — E1069 Type 1 diabetes mellitus with other specified complication: Secondary | ICD-10-CM | POA: Diagnosis not present

## 2021-09-17 DIAGNOSIS — D508 Other iron deficiency anemias: Secondary | ICD-10-CM

## 2021-09-17 DIAGNOSIS — E1169 Type 2 diabetes mellitus with other specified complication: Secondary | ICD-10-CM

## 2021-09-17 DIAGNOSIS — Z1331 Encounter for screening for depression: Secondary | ICD-10-CM

## 2021-09-17 DIAGNOSIS — E669 Obesity, unspecified: Secondary | ICD-10-CM

## 2021-09-17 DIAGNOSIS — R5383 Other fatigue: Secondary | ICD-10-CM

## 2021-09-17 DIAGNOSIS — Z794 Long term (current) use of insulin: Secondary | ICD-10-CM

## 2021-09-17 DIAGNOSIS — R0602 Shortness of breath: Secondary | ICD-10-CM

## 2021-09-17 DIAGNOSIS — Z0000 Encounter for general adult medical examination without abnormal findings: Secondary | ICD-10-CM

## 2021-09-18 ENCOUNTER — Encounter (INDEPENDENT_AMBULATORY_CARE_PROVIDER_SITE_OTHER): Payer: Self-pay | Admitting: Bariatrics

## 2021-09-18 DIAGNOSIS — E1069 Type 1 diabetes mellitus with other specified complication: Secondary | ICD-10-CM | POA: Insufficient documentation

## 2021-09-18 DIAGNOSIS — E039 Hypothyroidism, unspecified: Secondary | ICD-10-CM | POA: Insufficient documentation

## 2021-09-18 LAB — COMPREHENSIVE METABOLIC PANEL
ALT: 27 IU/L (ref 0–32)
AST: 33 IU/L (ref 0–40)
Albumin/Globulin Ratio: 2 (ref 1.2–2.2)
Albumin: 4.5 g/dL (ref 3.9–4.9)
Alkaline Phosphatase: 129 IU/L — ABNORMAL HIGH (ref 44–121)
BUN/Creatinine Ratio: 26 (ref 12–28)
BUN: 20 mg/dL (ref 8–27)
Bilirubin Total: 0.5 mg/dL (ref 0.0–1.2)
CO2: 26 mmol/L (ref 20–29)
Calcium: 10.1 mg/dL (ref 8.7–10.3)
Chloride: 101 mmol/L (ref 96–106)
Creatinine, Ser: 0.77 mg/dL (ref 0.57–1.00)
Globulin, Total: 2.2 g/dL (ref 1.5–4.5)
Glucose: 101 mg/dL — ABNORMAL HIGH (ref 70–99)
Potassium: 4.5 mmol/L (ref 3.5–5.2)
Sodium: 140 mmol/L (ref 134–144)
Total Protein: 6.7 g/dL (ref 6.0–8.5)
eGFR: 87 mL/min/{1.73_m2} (ref >59)

## 2021-09-18 LAB — TSH+T4F+T3FREE
Free T4: 1.49 ng/dL (ref 0.82–1.77)
T3, Free: 2.7 pg/mL (ref 2.0–4.4)
TSH: 0.049 u[IU]/mL — ABNORMAL LOW (ref 0.450–4.500)

## 2021-09-18 LAB — HEMOGLOBIN A1C
Est. average glucose Bld gHb Est-mCnc: 134 mg/dL
Hgb A1c MFr Bld: 6.3 % — ABNORMAL HIGH (ref 4.8–5.6)

## 2021-09-18 LAB — VITAMIN D 25 HYDROXY (VIT D DEFICIENCY, FRACTURES): Vit D, 25-Hydroxy: 70.8 ng/mL (ref 30.0–100.0)

## 2021-09-18 LAB — INSULIN, RANDOM: INSULIN: <0.4 u[IU]/mL — ABNORMAL LOW (ref 2.6–24.9)

## 2021-09-18 NOTE — Progress Notes (Signed)
Chief Complaint:   OBESITY Colleen Zuniga (MR# [id number]) is a 61 y.o. female who presents for evaluation and treatment of obesity and related comorbidities. Current BMI is Body mass index is 34.75 kg/m. Colleen Zuniga has been struggling with her weight for many years and has been unsuccessful in either losing weight, maintaining weight loss, or reaching her healthy weight goal.  Colleen Zuniga was here about 4 years ago.  She has been on multiple plans alternatives.  Her goal is around 160 pounds.  She is a caregiver.  Colleen Zuniga is currently in the action stage of change and ready to dedicate time achieving and maintaining a healthier weight. Colleen Zuniga is interested in becoming our patient and working on intensive lifestyle modifications including (but not limited to) diet and exercise for weight loss.  Colleen Zuniga's habits were reviewed today and are as follows: Her family eats meals together, she thinks her family will eat healthier with her, her desired weight loss is 35 lbs, she started gaining weight at menopause age 33, her heaviest weight ever was 199 pounds, she has significant food cravings issues, she snacks frequently in the evenings, she skips meals frequently, and she is frequently drinking liquids with calories.  Depression Screen Colleen Zuniga (modified PHQ-9) score was 2.     09/17/2021    8:53 AM  Depression screen PHQ 2/9  Decreased Interest 0  Down, Depressed, Hopeless 1  PHQ - 2 Score 1  Altered sleeping 0  Tired, decreased energy 0  Change in appetite 1  Feeling bad or failure about yourself  0  Trouble concentrating 0  Moving slowly or fidgety/restless 0  Suicidal thoughts 0  PHQ-9 Score 2  Difficult doing work/chores Not difficult at all   Subjective:   1. Other fatigue Colleen Zuniga admits to daytime somnolence and denies waking up still tired. Patient has a history of symptoms of daytime fatigue and morning headache. Colleen Zuniga generally gets 5 or 7 hours of sleep per night, and  states that she has generally restful sleep. Snoring is not present. Apneic episodes are not present. Epworth Sleepiness Score is 6.   2. SOB (shortness of breath) on exertion Colleen Zuniga notes increasing shortness of breath with exercising and seems to be worsening over time with weight gain. She notes getting out of breath sooner with activity than she used to. This has not gotten worse recently. Colleen Zuniga denies shortness of breath at rest or orthopnea.  3. Type 1 diabetes mellitus with other specified complication (HCC) Colleen Zuniga is taking NovoLog with insulin pump.  She has been type I diabetic for 54 years.  She has Dexcom 66.  4. Other specified hypothyroidism Colleen Zuniga is taking Synthroid, and she had a recent test.  5. Mixed diabetic hyperlipidemia associated with type 1 diabetes mellitus (HCC) Colleen Zuniga is taking Lipitor currently.  6. Other iron deficiency anemia Colleen Zuniga is taking iron with vitamin C.  Low ferritin iron stores.  7. Health care maintenance Colleen Zuniga will have labs done today.  Assessment/Plan:   1. Other fatigue Colleen Zuniga does feel that her weight is causing her energy to be lower than it should be. Fatigue may be related to obesity, depression or many other causes. Labs will be ordered, and in the meanwhile, Colleen Zuniga will focus on self care including making healthy food choices, increasing physical activity and focusing on stress reduction.  - EKG 12-Lead - TSH+T4F+T3Free - VITAMIN D 25 Hydroxy (Vit-D Deficiency, Fractures)  2. SOB (shortness of breath) on exertion Colleen Zuniga does  feel that she gets out of breath more easily that she used to when she exercises. Colleen Zuniga's shortness of breath appears to be obesity related and exercise induced. She has agreed to work on weight loss and gradually increase exercise to treat her exercise induced shortness of breath. Will continue to monitor closely.  3. Type 1 diabetes mellitus with other specified complication (HCC) We will check labs today.  Colleen Zuniga  will follow-up with her PCP.  She will keep her liquid Reli-on glucose shot.  - Comprehensive metabolic panel - Hemoglobin A1c - Insulin, random  4. Other specified hypothyroidism Colleen Zuniga will continue Synthroid and her PCP will continue to monitor.  5. Mixed diabetic hyperlipidemia associated with type 1 diabetes mellitus (HCC) We will check labs today, and we will follow-up at Colleen Zuniga's next visit.  6. Other iron deficiency anemia Colleen Zuniga's PCP will continue to follow, she has current lab work.  7. Health care maintenance We will check labs today, and we will follow-up at Colleen Zuniga's next visit.  - Comprehensive metabolic panel - Hemoglobin A1c - Insulin, random - TSH+T4F+T3Free - VITAMIN D 25 Hydroxy (Vit-D Deficiency, Fractures)  8. Depression screening Colleen Zuniga had a negative depression screening. Depression is commonly associated with obesity and often results in emotional eating behaviors. We will monitor this closely and work on CBT to help improve the non-hunger eating patterns. Referral to Psychology may be required if no improvement is seen as she continues in our clinic.  9. Class 1 obesity with serious comorbidity and body mass index (BMI) of 34.0 to 34.9 in adult, unspecified obesity type Colleen Zuniga is currently in the action stage of change and her goal is to continue with weight loss efforts. I recommend Colleen Zuniga begin the structured treatment plan as follows:  She has agreed to the Category 2 Plan or keeping a food journal and adhering to recommended goals of 1100-1200 calories and 70-80 grams of protein daily.  Reviewed labs with the patient from 06/16/2021, CMP, CBC, glucose, and insulin.  We will recheck her basal rate, per insulin pump (PCP directed).  Exercise goals: Deep water, will do prescribed fit program.  Behavioral modification strategies: increasing lean protein intake, decreasing simple carbohydrates, increasing vegetables, increasing water intake, decreasing eating out,  no skipping meals, meal planning and cooking strategies, keeping healthy foods in the home, and planning for success.  She was informed of the importance of frequent follow-up visits to maximize her success with intensive lifestyle modifications for her multiple health conditions. She was informed we would discuss her lab results at her next visit unless there is a critical issue that needs to be addressed sooner. Yasmeen agreed to keep her next visit at the agreed upon time to discuss these results.  Objective:   Blood pressure 127/78, pulse 74, temperature (!) 97.5 F (36.4 C), height 5\' 2"  (1.575 m), weight 190 lb (86.2 kg), SpO2 95 %. Body mass index is 34.75 kg/m.  EKG: Normal sinus rhythm, rate 72 BPM.  Indirect Calorimeter completed today shows a VO2 of 232 and a REE of 1598.  Her calculated basal metabolic rate is thus her basal metabolic rate is better than expected.  General: Cooperative, alert, well developed, in no acute distress. HEENT: Conjunctivae and lids unremarkable. Cardiovascular: Regular rhythm.  Lungs: Normal work of breathing. Neurologic: No focal deficits.   Lab Results  Component Value Date   CREATININE 0.77 09/17/2021   BUN 20 09/17/2021   NA 140 09/17/2021   K 4.5 09/17/2021   CL  101 09/17/2021   CO2 26 09/17/2021   Lab Results  Component Value Date   ALT 27 09/17/2021   AST 33 09/17/2021   ALKPHOS 129 (H) 09/17/2021   BILITOT 0.5 09/17/2021   Lab Results  Component Value Date   HGBA1C 6.3 (H) 09/17/2021   HGBA1C 6.8 (H) 07/28/2019   HGBA1C 6.4 (H) 07/28/2018   HGBA1C 6.2 (H) 04/29/2018   HGBA1C 6.1 (H) 08/11/2016   Lab Results  Component Value Date   INSULIN <0.4 (L) 09/17/2021   Lab Results  Component Value Date   TSH 0.049 (L) 09/17/2021   Lab Results  Component Value Date   CHOL 156 04/29/2018   HDL 74 04/29/2018   LDLCALC 67 04/29/2018   TRIG 76 04/29/2018   Lab Results  Component Value Date   WBC 5.8 06/16/2021   HGB  12.2 06/16/2021   HCT 37.3 06/16/2021   MCV 94.2 06/16/2021   PLT 211 06/16/2021   Lab Results  Component Value Date   IRON 37 01/18/2019   TIBC 233 (L) 01/18/2019   FERRITIN 21 01/18/2019   Attestation Statements:   Reviewed by clinician on day of visit: allergies, medications, problem list, medical history, surgical history, family history, social history, and previous encounter notes.   Trude Mcburney, am acting as Energy manager for [employer], DO.  I have reviewed the above documentation for accuracy and completeness, and I agree with the above. Corinna Capra, DO

## 2021-09-19 NOTE — Progress Notes (Signed)
Notified patient and she stated Dr. Felipa Eth is managing her thyroids and doesn't want to change the dose without speaking with him. She is also concerned about her Alkaline Phosphatase being elevated. Can you please advise?

## 2021-09-26 ENCOUNTER — Ambulatory Visit (INDEPENDENT_AMBULATORY_CARE_PROVIDER_SITE_OTHER): Payer: PRIVATE HEALTH INSURANCE | Admitting: Bariatrics

## 2021-10-01 ENCOUNTER — Encounter (INDEPENDENT_AMBULATORY_CARE_PROVIDER_SITE_OTHER): Payer: Self-pay | Admitting: Bariatrics

## 2021-10-01 ENCOUNTER — Ambulatory Visit (INDEPENDENT_AMBULATORY_CARE_PROVIDER_SITE_OTHER): Payer: Medicare Other | Admitting: Bariatrics

## 2021-10-01 VITALS — BP 118/69 | HR 63 | Temp 97.8°F | Ht 62.0 in | Wt 184.0 lb

## 2021-10-01 DIAGNOSIS — E1069 Type 1 diabetes mellitus with other specified complication: Secondary | ICD-10-CM

## 2021-10-01 DIAGNOSIS — E669 Obesity, unspecified: Secondary | ICD-10-CM

## 2021-10-01 DIAGNOSIS — Z6833 Body mass index (BMI) 33.0-33.9, adult: Secondary | ICD-10-CM | POA: Diagnosis not present

## 2021-10-01 DIAGNOSIS — E038 Other specified hypothyroidism: Secondary | ICD-10-CM

## 2021-10-07 NOTE — Progress Notes (Unsigned)
Chief Complaint:   OBESITY Colleen Zuniga is here to discuss her progress with her obesity treatment plan along with follow-up of her obesity related diagnoses. Colleen Zuniga is on the Category 2 Plan and keeping a food journal and adhering to recommended goals of 1100-1200 calories and 70-80 grams of protein daily and states she is following her eating plan approximately 100% of the time. Colleen Zuniga states she is doing 0 minutes 0 times per week.  Today's visit was #: 2 Starting weight: 190 lbs Starting date: 09/17/2021 Today's weight: 184 lbs Today's date: 10/01/2021 Total lbs lost to date: 6 Total lbs lost since last in-office visit: 6  Interim History: Colleen Zuniga is down 6 lbs since her first visit. She had to make some hard choices.   Subjective:   1. Other specified hypothyroidism Colleen Zuniga is taking Synthroid. Last thyroid panel TSH was too low. She contacted her PCP, and her Synthroid dose was lowered about 5 weeks ago.  2. Type 1 diabetes mellitus with other specified complication (HCC) Colleen Zuniga has a Dexcom, and she denies low blood sugars over night.  Assessment/Plan:   1. Other specified hypothyroidism Colleen Zuniga will continue to follow up with her PCP.   2. Type 1 diabetes mellitus with other specified complication (HCC) Colleen Zuniga will continue to use the Dexcom.   3. Obesity, Current BMI 33.8 Colleen Zuniga is currently in the action stage of change. As such, her goal is to continue with weight loss efforts. She has agreed to the Category 2 Plan and keeping a food journal and adhering to recommended goals of 1200 calories and 70-80 grams of protein daily.   Meal planning was discussed. She will continue to adhere to the plan. Reviewed labs with the patient from 09/17/2021, CMP, Vit D, A1c, insulin, and thyroid panel.   Exercise goals: No exercise has been prescribed at this time. As she will need a left knee replacement in the near future.  Behavioral modification strategies: increasing lean protein intake,  decreasing simple carbohydrates, increasing vegetables, increasing water intake, decreasing eating out, no skipping meals, meal planning and cooking strategies, keeping healthy foods in the home, and planning for success.  Colleen Zuniga has agreed to follow-up with our clinic in 2 weeks. She was informed of the importance of frequent follow-up visits to maximize her success with intensive lifestyle modifications for her multiple health conditions.   Objective:   Blood pressure 118/69, pulse 63, temperature 97.8 F (36.6 C), height 5\' 2"  (1.575 m), weight 184 lb (83.5 kg), SpO2 99 %. Body mass index is 33.65 kg/m.  General: Cooperative, alert, well developed, in no acute distress. HEENT: Conjunctivae and lids unremarkable. Cardiovascular: Regular rhythm.  Lungs: Normal work of breathing. Neurologic: No focal deficits.   Lab Results  Component Value Date   CREATININE 0.77 09/17/2021   BUN 20 09/17/2021   NA 140 09/17/2021   K 4.5 09/17/2021   CL 101 09/17/2021   CO2 26 09/17/2021   Lab Results  Component Value Date   ALT 27 09/17/2021   AST 33 09/17/2021   ALKPHOS 129 (H) 09/17/2021   BILITOT 0.5 09/17/2021   Lab Results  Component Value Date   HGBA1C 6.3 (H) 09/17/2021   HGBA1C 6.8 (H) 07/28/2019   HGBA1C 6.4 (H) 07/28/2018   HGBA1C 6.2 (H) 04/29/2018   HGBA1C 6.1 (H) 08/11/2016   Lab Results  Component Value Date   INSULIN <0.4 (L) 09/17/2021   Lab Results  Component Value Date   TSH 0.049 (L) 09/17/2021  Lab Results  Component Value Date   CHOL 156 04/29/2018   HDL 74 04/29/2018   LDLCALC 67 04/29/2018   TRIG 76 04/29/2018   Lab Results  Component Value Date   VD25OH 70.8 09/17/2021   VD25OH 41.3 04/29/2018   Lab Results  Component Value Date   WBC 5.8 06/16/2021   HGB 12.2 06/16/2021   HCT 37.3 06/16/2021   MCV 94.2 06/16/2021   PLT 211 06/16/2021   Lab Results  Component Value Date   IRON 37 01/18/2019   TIBC 233 (L) 01/18/2019   FERRITIN 21  01/18/2019   Attestation Statements:   Reviewed by clinician on day of visit: allergies, medications, problem list, medical history, surgical history, family history, social history, and previous encounter notes.   Trude Mcburney, am acting as Energy manager for [employer], DO.  I have reviewed the above documentation for accuracy and completeness, and I agree with the above. Corinna Capra, DO

## 2021-10-10 ENCOUNTER — Ambulatory Visit (INDEPENDENT_AMBULATORY_CARE_PROVIDER_SITE_OTHER): Payer: PRIVATE HEALTH INSURANCE | Admitting: Bariatrics

## 2021-10-16 ENCOUNTER — Encounter (INDEPENDENT_AMBULATORY_CARE_PROVIDER_SITE_OTHER): Payer: Self-pay

## 2021-10-21 ENCOUNTER — Encounter (INDEPENDENT_AMBULATORY_CARE_PROVIDER_SITE_OTHER): Payer: Self-pay | Admitting: Bariatrics

## 2021-10-21 NOTE — Telephone Encounter (Signed)
Can you please advise? Patient was last seen on 7/25 and has an appointment on 8/21

## 2021-10-28 ENCOUNTER — Ambulatory Visit (INDEPENDENT_AMBULATORY_CARE_PROVIDER_SITE_OTHER): Payer: Medicare Other | Admitting: Bariatrics

## 2021-10-29 ENCOUNTER — Ambulatory Visit (INDEPENDENT_AMBULATORY_CARE_PROVIDER_SITE_OTHER): Payer: Medicare Other | Admitting: Bariatrics

## 2021-10-29 ENCOUNTER — Encounter (INDEPENDENT_AMBULATORY_CARE_PROVIDER_SITE_OTHER): Payer: Self-pay | Admitting: Bariatrics

## 2021-10-29 VITALS — BP 110/65 | HR 72 | Temp 98.1°F | Ht 62.0 in | Wt 187.0 lb

## 2021-10-29 DIAGNOSIS — Z794 Long term (current) use of insulin: Secondary | ICD-10-CM

## 2021-10-29 DIAGNOSIS — Z6834 Body mass index (BMI) 34.0-34.9, adult: Secondary | ICD-10-CM | POA: Diagnosis not present

## 2021-10-29 DIAGNOSIS — E669 Obesity, unspecified: Secondary | ICD-10-CM | POA: Diagnosis not present

## 2021-10-29 DIAGNOSIS — E1069 Type 1 diabetes mellitus with other specified complication: Secondary | ICD-10-CM

## 2021-11-04 NOTE — Progress Notes (Unsigned)
Chief Complaint:   OBESITY Colleen Zuniga is here to discuss her progress with her obesity treatment plan along with follow-up of her obesity related diagnoses. Colleen Zuniga is on the Category 2 Plan and keeping a food journal and adhering to recommended goals of 1200 calories and 70-80 gms protein and states she is following her eating plan approximately 70% of the time. Colleen Zuniga states she is not currently exercising.  Today's visit was #: 3 Starting weight: 190 lbs Starting date: 09/17/2021 Today's weight: 187 Today's date: 10/29/21 Total lbs lost to date: 3 Total lbs lost since last in-office visit: +3  Interim History: She is up 3 pounds since her last visit.  She is skipping her breakfast.  She is concerned about lack of weight loss despite her best efforts.  Subjective:   1. Type 1 diabetes mellitus with other specified complication (HCC) Taking about 25 to 35 units through her pump. She was on [facility] for a short time.  Assessment/Plan:   1. Type 1 diabetes mellitus with other specified complication (HCC) 1.  Will call her PCP and discuss Mounjaro (even though a type I diabetic).  Some endocrinologists are using GLP-1's with some success and she may be able to decrease her insulin requirement.  2. Obesity, Current BMI 34.3 1.  We will continue to log her food. 2.  Will begin category 1 instead of category 2. 3.  Discussed a low carbohydrate option.  Discussed Dr. Melvyn Neth book -The New Atkins For You.  Colleen Zuniga is currently in the action stage of change. As such, her goal is to continue with weight loss efforts. She has agreed to the Category 1 Plan and keeping a food journal and adhering to recommended goals of 1100-1200 calories and 70-80 gms protein.   Exercise goals: No exercise has been prescribed at this time.  Behavioral modification strategies: increasing lean protein intake, decreasing simple carbohydrates, increasing vegetables, increasing water intake, decreasing eating out, no  skipping meals, meal planning and cooking strategies, keeping healthy foods in the home, and planning for success.  Colleen Zuniga has agreed to follow-up with our clinic in 3 weeks time-30 minutes with Dr. Cathey Endow.   She was informed of the importance of frequent follow-up visits to maximize her success with intensive lifestyle modifications for her multiple health conditions.    Objective:   Blood pressure 110/65, pulse 72, temperature 98.1 F (36.7 C), height 5\' 2"  (1.575 m), weight 187 lb (84.8 kg), SpO2 97 %. Body mass index is 34.2 kg/m.  General: Cooperative, alert, well developed, in no acute distress. HEENT: Conjunctivae and lids unremarkable. Cardiovascular: Regular rhythm.  Lungs: Normal work of breathing. Neurologic: No focal deficits.   Lab Results  Component Value Date   CREATININE 0.77 09/17/2021   BUN 20 09/17/2021   NA 140 09/17/2021   K 4.5 09/17/2021   CL 101 09/17/2021   CO2 26 09/17/2021   Lab Results  Component Value Date   ALT 27 09/17/2021   AST 33 09/17/2021   ALKPHOS 129 (H) 09/17/2021   BILITOT 0.5 09/17/2021   Lab Results  Component Value Date   HGBA1C 6.3 (H) 09/17/2021   HGBA1C 6.8 (H) 07/28/2019   HGBA1C 6.4 (H) 07/28/2018   HGBA1C 6.2 (H) 04/29/2018   HGBA1C 6.1 (H) 08/11/2016   Lab Results  Component Value Date   INSULIN <0.4 (L) 09/17/2021   Lab Results  Component Value Date   TSH 0.049 (L) 09/17/2021   Lab Results  Component Value Date  CHOL 156 04/29/2018   HDL 74 04/29/2018   LDLCALC 67 04/29/2018   TRIG 76 04/29/2018   Lab Results  Component Value Date   VD25OH 70.8 09/17/2021   VD25OH 41.3 04/29/2018   Lab Results  Component Value Date   WBC 5.8 06/16/2021   HGB 12.2 06/16/2021   HCT 37.3 06/16/2021   MCV 94.2 06/16/2021   PLT 211 06/16/2021   Lab Results  Component Value Date   IRON 37 01/18/2019   TIBC 233 (L) 01/18/2019   FERRITIN 21 01/18/2019     Attestation Statements:   Reviewed by clinician on day of  visit: allergies, medications, problem list, medical history, surgical history, family history, social history, and previous encounter notes.  I, Dawn Whitmire, FNP-C, am acting as transcriptionist for Dr. Corinna Capra.  I have reviewed the above documentation for accuracy and completeness, and I agree with the above. Corinna Capra, DO

## 2021-11-05 ENCOUNTER — Encounter (INDEPENDENT_AMBULATORY_CARE_PROVIDER_SITE_OTHER): Payer: Self-pay | Admitting: Bariatrics

## 2021-11-21 ENCOUNTER — Ambulatory Visit (INDEPENDENT_AMBULATORY_CARE_PROVIDER_SITE_OTHER): Payer: Medicare Other | Admitting: Family Medicine

## 2021-11-21 ENCOUNTER — Encounter (INDEPENDENT_AMBULATORY_CARE_PROVIDER_SITE_OTHER): Payer: Self-pay | Admitting: Family Medicine

## 2021-11-21 VITALS — BP 108/58 | HR 66 | Temp 97.7°F | Ht 62.0 in | Wt 184.0 lb

## 2021-11-21 DIAGNOSIS — E669 Obesity, unspecified: Secondary | ICD-10-CM

## 2021-11-21 DIAGNOSIS — E038 Other specified hypothyroidism: Secondary | ICD-10-CM

## 2021-11-21 DIAGNOSIS — M170 Bilateral primary osteoarthritis of knee: Secondary | ICD-10-CM | POA: Diagnosis not present

## 2021-11-21 DIAGNOSIS — E109 Type 1 diabetes mellitus without complications: Secondary | ICD-10-CM

## 2021-11-21 DIAGNOSIS — Z6833 Body mass index (BMI) 33.0-33.9, adult: Secondary | ICD-10-CM

## 2021-11-21 DIAGNOSIS — Z794 Long term (current) use of insulin: Secondary | ICD-10-CM

## 2021-11-21 DIAGNOSIS — Z6834 Body mass index (BMI) 34.0-34.9, adult: Secondary | ICD-10-CM

## 2021-11-26 NOTE — Progress Notes (Signed)
Chief Complaint:   OBESITY Colleen Zuniga is here to discuss her progress with her obesity treatment plan along with follow-up of her obesity related diagnoses. Colleen Zuniga is on the Category 1 Plan and keeping a food journal and adhering to recommended goals of [phone] calories and 70-80 protein and states she is following her eating plan approximately 50% of the time. [name] states she is walking 60 minutes 5-7 times per week.  Today's visit was #: 4 Starting weight: 190 lbs Starting date: 09/17/2021 Today's weight: 184 lbs Today's date: 11/21/2021 Total lbs lost to date: 6 lbs Total lbs lost since last in-office visit: 3 lbs  Interim History: started Mounjaro 2.5 mg weekly injections for 3 weeks with endocrinologist.  Has T1DM, she is on an insulin pump.  Still skipping meals.  Busy as a caregiver.  Logging daily intake and hitting goals. Diabetes managed by Dr Felipa Eth.  Exercise limited due to knee pain.   Subjective:   1. Other specified hypothyroidism Levothyroxine dose was reduced by her PCP.  Plans to have TSH rechecked in October.   2. Type 1 diabetes mellitus without complications (HCC) On insulin pump and has CGM. Getting 25-35 units per day.  Bolos was decreased by diabetes educator due to lows.  A1c was 6.3.  prescribed Mounjaro PCP.    3. Osteoarthritis of both knees, unspecified osteoarthritis type Left total knee replacement planned October and right total knee replacement planned for January.   Plans to walk more after both knees replaced.  Limits exercise for now.   Assessment/Plan:   1. Other specified hypothyroidism Continue Levothyroxine daily on empty stomach.   2. Type 1 diabetes mellitus without complications (HCC) 1) continue to work on balancing protein and carbohydrates with meals.  Not skipping meals.  2) Continue current meals per PCP.     3. Osteoarthritis of both knees, unspecified osteoarthritis type Continue to work on weight reduction, plan to ramp up  workouts post op.   4. Obesity,current BMI 33.8 1) Avoid meal skipping. 2) Continue Mounjaro per PCP.   Colleen Zuniga is currently in the action stage of change. As such, her goal is to continue with weight loss efforts. She has agreed to the Category 1 Plan + 80 protein  Exercise goals: All adults should avoid inactivity. Some physical activity is better than none, and adults who participate in any amount of physical activity gain some health benefits.  Behavioral modification strategies: increasing lean protein intake, increasing vegetables, increasing water intake, decreasing eating out, no skipping meals, meal planning and cooking strategies, keeping healthy foods in the home, better snacking choices, and decreasing junk food.  Colleen Zuniga has agreed to follow-up with our clinic in 4 weeks. She was informed of the importance of frequent follow-up visits to maximize her success with intensive lifestyle modifications for her multiple health conditions.   Blood pressure (!) 108/58, pulse 66, temperature 97.7 F (36.5 C), height 5\' 2"  (1.575 m), weight 184 lb (83.5 kg), SpO2 97 %. Body mass index is 33.65 kg/m.  General: Cooperative, alert, well developed, in no acute distress. HEENT: Conjunctivae and lids unremarkable. Cardiovascular: Regular rhythm.  Lungs: Normal work of breathing. Neurologic: No focal deficits.   Lab Results  Component Value Date   CREATININE 0.77 09/17/2021   BUN 20 09/17/2021   NA 140 09/17/2021   K 4.5 09/17/2021   CL 101 09/17/2021   CO2 26 09/17/2021   Lab Results  Component Value Date   ALT 27 09/17/2021  AST 33 09/17/2021   ALKPHOS 129 (H) 09/17/2021   BILITOT 0.5 09/17/2021   Lab Results  Component Value Date   HGBA1C 6.3 (H) 09/17/2021   HGBA1C 6.8 (H) 07/28/2019   HGBA1C 6.4 (H) 07/28/2018   HGBA1C 6.2 (H) 04/29/2018   HGBA1C 6.1 (H) 08/11/2016   Lab Results  Component Value Date   INSULIN <0.4 (L) 09/17/2021   Lab Results  Component Value Date    TSH 0.049 (L) 09/17/2021   Lab Results  Component Value Date   CHOL 156 04/29/2018   HDL 74 04/29/2018   LDLCALC 67 04/29/2018   TRIG 76 04/29/2018   Lab Results  Component Value Date   VD25OH 70.8 09/17/2021   VD25OH 41.3 04/29/2018   Lab Results  Component Value Date   WBC 5.8 06/16/2021   HGB 12.2 06/16/2021   HCT 37.3 06/16/2021   MCV 94.2 06/16/2021   PLT 211 06/16/2021   Lab Results  Component Value Date   IRON 37 01/18/2019   TIBC 233 (L) 01/18/2019   FERRITIN 21 01/18/2019    Obesity Behavioral Intervention:   Approximately 15 minutes were spent on the discussion below.  ASK: We discussed the diagnosis of obesity with Colleen Zuniga today and Colleen Zuniga agreed to give Colleen Zuniga permission to discuss obesity behavioral modification therapy today.  ASSESS: Colleen Zuniga has the diagnosis of obesity and her BMI today is 33.6. Colleen Zuniga is in the action stage of change.   ADVISE: Colleen Zuniga was educated on the multiple health risks of obesity as well as the benefit of weight loss to improve her health. She was advised of the need for long term treatment and the importance of lifestyle modifications to improve her current health and to decrease her risk of future health problems.  AGREE: Multiple dietary modification options and treatment options were discussed and Colleen Zuniga agreed to follow the recommendations documented in the above note.  ARRANGE: Colleen Zuniga was educated on the importance of frequent visits to treat obesity as outlined per CMS and USPSTF guidelines and agreed to schedule her next follow up appointment today.  Attestation Statements:   Reviewed by clinician on day of visit: allergies, medications, problem list, medical history, surgical history, family history, social history, and previous encounter notes.  I, Davy Pique, am acting as Location manager for Loyal Gambler, DO.  I have reviewed the above documentation for accuracy and completeness, and I agree with the above. Dell Ponto, DO

## 2021-12-03 NOTE — H&P (Signed)
TOTAL KNEE ADMISSION H&P  Patient is being admitted for left total knee arthroplasty.  Subjective:  Chief Complaint: Left knee pain.  HPI: Colleen Zuniga, 61 y.o. female has a history of pain and functional disability in the left knee due to arthritis and has failed non-surgical conservative treatments for greater than 12 weeks to include corticosteriod injections, viscosupplementation injections, and activity modification. Onset of symptoms was gradual, starting  several  years ago with gradually worsening course since that time. The patient noted no past surgery on the left knee.  Patient currently rates pain in the left knee at 8 out of 10 with activity. Patient has night pain, worsening of pain with activity and weight bearing, pain with passive range of motion, and crepitus. Patient has evidence of  bone-on-bone arthritis in the medial and patellofemoral compartments bilaterally  by imaging studies. There is no active infection.  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   Lumbar stenosis 08/18/2016   Cervical spondylosis with myelopathy and radiculopathy 01/15/2016   Acquired scoliosis 01/15/2016   Arthritis    Hypokalemia    Chest pain     Past Medical History:  Diagnosis Date   Anemia    Anxiety    Arthritis    Back pain    Back pain    De Quervain's tenosynovitis, right 01/2012   Degenerative disc disease, cervical    states neck is stiff, reduced range of motion   Dental crowns present    Depression    Diabetes mellitus    Insulin pump   GERD (gastroesophageal reflux disease)    Heart murmur    states has a functional murmur, and that she has never had any  problems   Hypothyroidism    Joint pain    Osteoarthritis    PONV (postoperative nausea and vomiting)     Past Surgical History:  Procedure Laterality Date   ANTERIOR CERVICAL DISCECTOMY  02/20/2016   ANTERIOR LAT LUMBAR FUSION N/A 08/18/2016   Procedure: ANTERIOR LATERAL LUMBAR FUSION LUMBAR TWO- LUMBAR THREE, LUMBAR THREE- LUMBAR FOUR, LUMBAR FOUR- LUMBAR FIVE; LUMBAR TWO- LUMBAR FIVE PEDICLE SCREW FIXATION;  Surgeon: Jovita Gamma, MD;  Location: [place];  Service: Neurosurgery;  Laterality: N/A;  ANTERIOR LATERAL LUMBAR FUSION LUMBAR 2- LUMBAR 3, LUMBAR 3- LUMBAR 4-, LUMBAR 4- LUMBAR 5; LUMBAR 2- LUMBAR 5 PEDICLE SCREW FIXATION   ANTERIOR LAT LUMBAR FUSION N/A 08/05/2019   Procedure: ANTERIOR LATERAL LUMBAR INTERBODY FUSION LUMBAR ONE-TWO.;  Surgeon: Kary Kos, MD;  Location: [place];  Service: Neurosurgery;  Laterality: N/A;  anterolateral   APPLICATION OF INTRAOPERATIVE CT SCAN N/A 08/05/2019   Procedure: APPLICATION OF INTRAOPERATIVE CT SCAN;  Surgeon: Kary Kos, MD;  Location: [place];  Service: Neurosurgery;  Laterality: N/A;   BACK SURGERY  07/2018   plif l5-s1   Cataracts Bilateral    CESAREAN SECTION     DORSAL COMPARTMENT RELEASE  02/08/2002   first dorsal compartment left wrist   DORSAL COMPARTMENT RELEASE  02/03/2012   Procedure: RELEASE DORSAL COMPARTMENT (DEQUERVAIN);  Surgeon: Wynonia Sours, MD;  Location: [place];  Service: Orthopedics;  Laterality: Right;  RELEASE DEQUERVAINS RIGHT WRIST   KNEE ARTHROSCOPY  10/14/2004  right   LAMINECTOMY WITH POSTERIOR LATERAL ARTHRODESIS LEVEL 1 N/A 08/05/2019   Procedure: Posterior lateral fusion - Lumbar Five-Sacral One with pelvic fixation with iliac screws and Nuvasive instrumentation;  Surgeon: Donalee Citrin, MD;  Location[facility];  Service: Neurosurgery;  Laterality: N/A;  posterior   SHOULDER ARTHROSCOPY  05/05/2005   left   TRIGGER FINGER RELEASE  01/07/2011   release A1 pulley left thumb   TRIGGER FINGER RELEASE      x 5 other fingers    Prior to Admission medications   Medication Sig Start Date End Date Taking? Authorizing Provider  acetaminophen (TYLENOL) 325 MG tablet Take 325 mg by mouth 3 (three) times daily.    [provider]  albuterol (VENTOLIN HFA) 108 (90 Base) MCG/ACT inhaler Inhale 2 puffs into the lungs every 4 (four) hours as needed. 04/08/19   [provider]  ALPRAZolam Prudy Feeler) 0.5 MG tablet Take 0.25-0.5 mg by mouth at bedtime as needed for sleep or anxiety. 05/25/16   [provider]  Ascorbic Acid (VITAMIN C WITH ROSE HIPS) 500 MG tablet Take 500 mg by mouth daily after breakfast.     [provider]  atorvastatin (LIPITOR) 10 MG tablet Take 10 mg by mouth daily after supper.    [provider]  BAQSIMI TWO PACK 3 MG/DOSE POWD Place 3 mg into the nose once. 06/08/19   [provider]  buPROPion (WELLBUTRIN XL) 300 MG 24 hr tablet Take 300 mg by mouth daily before breakfast.    [provider]  CALCIUM-VITAMIN D PO Take 500 mg by mouth 2 (two) times a day.     [provider]  Continuous Glucose Monitor DEVI 1 Device by Other route continuous.    [provider]  etodolac (LODINE) 500 MG tablet Take 500 mg by mouth daily.     [provider]  fluticasone (FLONASE) 50 MCG/ACT nasal spray Place 2 sprays into both nostrils daily.    [provider]  gabapentin (NEURONTIN) 300 MG capsule Take 300 mg by mouth 4 (four) times daily.     [provider]  insulin aspart (NOVOLOG) 100 UNIT/ML injection Continuous with insulin pump    [provider]  Insulin Human (INSULIN PUMP) SOLN Inject into the skin continuous. Novolog    [provider]  irbesartan (AVAPRO) 300 MG tablet Take 300 mg by mouth daily after breakfast.    [provider]  levocetirizine (XYZAL) 5 MG tablet Take 5 mg by mouth daily.    [provider]  levothyroxine (SYNTHROID) 100 MCG  tablet Take 100 mcg by mouth every morning. 08/21/21   [provider]  Magnesium 500 MG TABS Take 500 mg by mouth daily after breakfast.    [provider]  metoCLOPramide (REGLAN) 10 MG tablet Take 10 mg by mouth daily as needed for nausea.    [provider]  Multiple Vitamin (MULTIVITAMIN WITH MINERALS) TABS tablet Take 1 tablet by mouth daily after breakfast.    [provider]  ondansetron (ZOFRAN) 4 MG tablet Take 1 tablet (4 mg total) by mouth daily as needed for nausea or vomiting. 02/11/19   Rachael Fee, MD  ondansetron (ZOFRAN-ODT) 8 MG disintegrating tablet Take 1 tablet (8 mg total) by mouth every 8 (eight) hours as needed for nausea or vomiting. 06/16/21   Lorre Nick, MD  potassium gluconate 595 (99 K) MG TABS tablet Take 595 mg by mouth daily after breakfast.    [provider]  Propylene Glycol (SYSTANE COMPLETE) 0.6 % SOLN Place 2 drops into both eyes 3 (three) times daily as needed (dry eyes).    [provider]  tirzepatide Greggory Keen) 5 MG/0.5ML Pen inject 5mg  Subcutaneous weekly for 90 days 11/20/21   [provider]    Allergies  Allergen Reactions   Penicillins Anaphylaxis and Other (See Comments)    CLOSES THROAT PATIENT HAD A PCN REACTION WITH IMMEDIATE RASH, FACIAL/TONGUE/THROAT SWELLING, SOB, OR LIGHTHEADEDNESS WITH HYPOTENSION:  #  #  #  YES  #  #  #   Has patient had a PCN reaction causing severe rash involving mucus membranes or skin necrosis: UNKNOWN  Has patient had a PCN reaction that required hospitalization:No Has patient had a PCN reaction occurring within the last 10 years:No    Hydrocodone-Acetaminophen     Nausea/vomiting with or without food   Adhesive [Tape] Rash    Itching    Social History   Socioeconomic History   Marital status: Married    Spouse name: Marinda Tyer   Number of children: Not on file   Years of education: Not on file   Highest education level: Not on file   Occupational History   Occupation: [employer]  Tobacco Use   Smoking status: Former    Types: Cigarettes    Quit date: 03/11/1991    Years since quitting: 30.7   Smokeless tobacco: Never  Vaping Use   Vaping Use: Never used  Substance and Sexual Activity   Alcohol use: Yes    Comment: rarely   Drug use: No   Sexual activity: Not on file  Other Topics Concern   Not on file  Social History Narrative   Not on file   Social Determinants of Health   Financial Resource Strain: Not on file  Food Insecurity: Not on file  Transportation Needs: Not on file  Physical Activity: Not on file  Stress: Not on file  Social Connections: Not on file  Intimate Partner Violence: Not on file    Tobacco Use: Medium Risk (11/21/2021)   Patient History    Smoking Tobacco Use: Former    Smokeless Tobacco Use: Never    Passive Exposure: Not on file   Social History   Substance and Sexual Activity  Alcohol Use Yes   Comment: rarely    Family History  Problem Relation Age of Onset   Heart disease Mother    Stroke Father    Diabetes Father    Heart disease Father    Kidney disease Father    Obesity Father    Colon cancer Neg Hx    Esophageal cancer Neg Hx    Rectal cancer Neg Hx    Stomach cancer Neg Hx     Review of Systems  Constitutional:  Negative for chills and fever.  HENT:  Negative for congestion, sore throat and tinnitus.   Eyes:  Negative for double vision, photophobia and pain.  Respiratory:  Negative for cough, shortness of breath and wheezing.   Cardiovascular:  Negative for chest pain, palpitations and orthopnea.  Gastrointestinal:  Negative for heartburn, nausea and vomiting.  Genitourinary:  Negative for dysuria, frequency and urgency.  Musculoskeletal:  Positive for joint pain.  Neurological:  Negative for dizziness, weakness and headaches.    Objective:  Physical Exam: Well nourished and well developed.  General: Alert and oriented x3, cooperative and  pleasant, no acute distress.  Head: normocephalic, atraumatic, neck supple.  Eyes: EOMI.  Musculoskeletal:  Right Knee Exam: No effusion present. No swelling present. The range of motion is: 0 to 125 degrees. No crepitus on range of motion of the knee. Positive medial joint line tenderness. No lateral joint line tenderness. The knee is stable.  Calves soft and nontender. Motor function intact in LE. Strength 5/5 LE bilaterally. Neuro: Distal pulses 2+. Sensation to light touch intact in LE.  Imaging Review Plain radiographs demonstrate severe degenerative joint disease of the left knee. The overall alignment is neutral. The bone quality appears to be adequate for age and reported activity level.  Assessment/Plan:  End stage arthritis, left knee   The patient history, physical examination, clinical judgment of the provider and imaging studies are consistent with end stage degenerative joint disease of the left knee and total knee arthroplasty is deemed medically necessary. The treatment options including medical management, injection therapy arthroscopy and arthroplasty were discussed at length. The risks and benefits of total knee arthroplasty were presented and reviewed. The risks due to aseptic loosening, infection, stiffness, patella tracking problems, thromboembolic complications and other imponderables were discussed. The patient acknowledged the explanation, agreed to proceed with the plan and consent was signed. Patient is being admitted for inpatient treatment for surgery, pain control, PT, OT, prophylactic antibiotics, VTE prophylaxis, progressive ambulation and ADLs and discharge planning. The patient is planning to be discharged  home .   Patient's anticipated LOS is less than 2 midnights, meeting these requirements: - Younger than 84 - Lives within 1 hour of care - Has a competent adult at home to recover with post-op recover - NO history of  - Chronic pain requiring  opioids  - Diabetes  - Coronary Artery Disease  - Heart failure  - Heart attack  - Stroke  - DVT/VTE  - Cardiac arrhythmia  - Respiratory Failure/COPD  - Renal failure  - Anemia  - Advanced Liver disease  Therapy Plans: Outpatient therapy at [facility] Disposition: Home with sister and neighbor Planned DVT Prophylaxis: Aspirin 325 mg BID DME Needed: None PCP: Larina Earthly, MD (clearance received) TXA: IV Allergies: PCN (anaphylaxis), LATEX, hydrocodone (nausea) Anesthesia Concerns: Extensive lumbar hardware BMI: 34.6 Last HgbA1c: 6.2% (11/2021) Pharmacy: [facility])  Other: - Tolerates percocet - PMH: DM type I - wants to bring insulin pump   - Patient was instructed on what medications to stop prior to surgery. - Follow-up visit in 2 weeks with Dr. Lequita Halt - Begin physical therapy following surgery - Pre-operative lab work as pre-surgical testing - Prescriptions will be provided in hospital at time of discharge  Arther Abbott, PA-C Orthopedic Surgery [facility] [place]

## 2021-12-04 NOTE — Addendum Note (Signed)
Addended by: Neomia Dear on: 12/04/2021 08:32 AM   Modules accepted: Level of Service

## 2021-12-09 NOTE — Patient Instructions (Signed)
DUE TO COVID-19 ONLY TWO VISITORS  (aged 63 and older)  ARE ALLOWED TO COME WITH YOU AND STAY IN THE WAITING ROOM ONLY DURING PRE OP AND PROCEDURE.   **NO VISITORS ARE ALLOWED IN THE SHORT STAY AREA OR RECOVERY ROOM!!**  IF YOU WILL BE ADMITTED INTO THE HOSPITAL YOU ARE ALLOWED ONLY FOUR SUPPORT PEOPLE DURING VISITATION HOURS ONLY (7 AM -8PM)   The support person(s) must pass our screening, gel in and out, and wear a mask at all times, including in the patient's room. Patients must also wear a mask when staff or their support person are in the room. Visitors GUEST BADGE MUST BE WORN VISIBLY  One adult visitor may remain with you overnight and MUST be in the room by 8 P.M.     Your procedure is scheduled on: 12/23/21   Report to [facility] Main Entrance    Report to admitting at : 5:50 AM   Call this number if you have problems the morning of surgery [phone]   Do not eat food :After Midnight.   After Midnight you may have the following liquids until : 5:00 AM DAY OF SURGERY  Water Black Coffee (sugar ok, NO MILK/CREAM OR CREAMERS)  Tea (sugar ok, NO MILK/CREAM OR CREAMERS) regular and decaf                             Plain Jell-O (NO RED)                                           Fruit ices (not with fruit pulp, NO RED)                                     Popsicles (NO RED)                                                                  Juice: apple, WHITE grape, WHITE cranberry Sports drinks like Gatorade (NO RED)              Drink G2 drink AT:  5:00 AM the day of surgery.       The day of surgery:  Drink ONE (1) Pre-Surgery Clear Ensure or G2 at AM the morning of surgery. Drink in one sitting. Do not sip.  This drink was given to you during your hospital  pre-op appointment visit. Nothing else to drink after completing the  Pre-Surgery Clear Ensure or G2.          If you have questions, please contact your surgeon's office.    Oral Hygiene is also  important to reduce your risk of infection.                                    Remember - BRUSH YOUR TEETH THE MORNING OF SURGERY WITH YOUR REGULAR TOOTHPASTE   Do NOT smoke after Midnight   Take these medicines the morning of surgery  with A SIP OF WATER: etodolac,gabapentin,synthroid,bupropion,levocetirizine.Use inhalers and Flonase as usual.Tylenol,alprazolam as needed.  DO NOT TAKE ANY ORAL DIABETIC MEDICATIONS DAY OF YOUR SURGERY  How to Manage Your Diabetes Before and After Surgery  Why is it important to control my blood sugar before and after surgery? Improving blood sugar levels before and after surgery helps healing and can limit problems. A way of improving blood sugar control is eating a healthy diet by:  Eating less sugar and carbohydrates  Increasing activity/exercise  Talking with your doctor about reaching your blood sugar goals High blood sugars (greater than 180 mg/dL) can raise your risk of infections and slow your recovery, so you will need to focus on controlling your diabetes during the weeks before surgery. Make sure that the doctor who takes care of your diabetes knows about your planned surgery including the date and location.  How do I manage my blood sugar before surgery? Check your blood sugar at least 4 times a day, starting 2 days before surgery, to make sure that the level is not too high or low. Check your blood sugar the morning of your surgery when you wake up and every 2 hours until you get to the Short Stay unit. If your blood sugar is less than 70 mg/dL, you will need to treat for low blood sugar: Do not take insulin. Treat a low blood sugar (less than 70 mg/dL) with  cup of clear juice (cranberry or apple), 4 glucose tablets, OR glucose gel. Recheck blood sugar in 15 minutes after treatment (to make sure it is greater than 70 mg/dL). If your blood sugar is not greater than 70 mg/dL on recheck, call [phone] for further instructions. Report your  blood sugar to the short stay nurse when you get to Short Stay.  If you are admitted to the hospital after surgery: Your blood sugar will be checked by the staff and you will probably be given insulin after surgery (instead of oral diabetes medicines) to make sure you have good blood sugar levels. The goal for blood sugar control after surgery is 80-180 mg/dL.   WHAT DO I DO ABOUT MY DIABETES MEDICATION?  Do not take oral diabetes medicines (pills) the morning of surgery.  DO NOT TAKE THE FOLLOWING 7 DAYS PRIOR TO SURGERY: Ozempic, Wegovy, Rybelsus (Semaglutide), Byetta (exenatide), Bydureon (exenatide ER), Victoza, Saxenda (liraglutide), or Trulicity (dulaglutide) Mounjaro (Tirzepatide) Adlyxin (Lixisenatide), Polyethylene Glycol Loxenatide.  If your CBG is greater than 220 mg/dL, you may take  of your sliding scale  (correction) dose of insulin.    For patients with insulin pumps: Contact your diabetes doctor for specific instructions before surgery. Decrease basal rates by 20% at midnight the night before your surgery. Note that if your surgery is planned to be longer than 2 hours, your insulin pump will be removed and intravenous (IV) insulin will be started and managed by the nurses and the anesthesiologist. You will be able to restart your insulin pump once you are awake and able to manage it.  Make sure to bring insulin pump supplies to the hospital with you in case the  site needs to be changed.  Patient Signature:  Date:   Nurse Signature:  Date:   Reviewed and Endorsed by [facility] Patient Education Committee, August 2015                               You may not have any metal  on your body including hair pins, jewelry, and body piercing             Do not wear make-up, lotions, powders, perfumes/cologne, or deodorant  Do not wear nail polish including gel and S&S, artificial/acrylic nails, or any other type of covering on natural nails including finger and toenails. If  you have artificial nails, gel coating, etc. that needs to be removed by a nail salon please have this removed prior to surgery or surgery may need to be canceled/ delayed if the surgeon/ anesthesia feels like they are unable to be safely monitored.   Do not shave  48 hours prior to surgery.    Do not bring valuables to the hospital. [facility] IS NOT             RESPONSIBLE   FOR VALUABLES.   Contacts, dentures or bridgework may not be worn into surgery.   Bring small overnight bag day of surgery.   DO NOT BRING YOUR HOME MEDICATIONS TO THE HOSPITAL. PHARMACY WILL DISPENSE MEDICATIONS LISTED ON YOUR MEDICATION LIST TO YOU DURING YOUR ADMISSION IN THE HOSPITAL!    Patients discharged on the day of surgery will not be allowed to drive home.  Someone NEEDS to stay with you for the first 24 hours after anesthesia.   Special Instructions: Bring a copy of your healthcare power of attorney and living will documents         the day of surgery if you haven't scanned them before.              Please read over the following fact sheets you were given: IF YOU HAVE QUESTIONS ABOUT YOUR PRE-OP INSTRUCTIONS PLEASE CALL [phone]     [facility] - Preparing for Surgery Before surgery, you can play an important role.  Because skin is not sterile, your skin needs to be as free of germs as possible.  You can reduce the number of germs on your skin by washing with CHG (chlorahexidine gluconate) soap before surgery.  CHG is an antiseptic cleaner which kills germs and bonds with the skin to continue killing germs even after washing. Please DO NOT use if you have an allergy to CHG or antibacterial soaps.  If your skin becomes reddened/irritated stop using the CHG and inform your nurse when you arrive at Short Stay. Do not shave (including legs and underarms) for at least 48 hours prior to the first CHG shower.  You may shave your face/neck. Please follow these instructions carefully:  1.  Shower with CHG Soap  the night before surgery and the  morning of Surgery.  2.  If you choose to wash your hair, wash your hair first as usual with your  normal  shampoo.  3.  After you shampoo, rinse your hair and body thoroughly to remove the  shampoo.                           4.  Use CHG as you would any other liquid soap.  You can apply chg directly  to the skin and wash                       Gently with a scrungie or clean washcloth.  5.  Apply the CHG Soap to your body ONLY FROM THE NECK DOWN.   Do not use on face/ open  Wound or open sores. Avoid contact with eyes, ears mouth and genitals (private parts).                       Wash face,  Genitals (private parts) with your normal soap.             6.  Wash thoroughly, paying special attention to the area where your surgery  will be performed.  7.  Thoroughly rinse your body with warm water from the neck down.  8.  DO NOT shower/wash with your normal soap after using and rinsing off  the CHG Soap.                9.  Pat yourself dry with a clean towel.            10.  Wear clean pajamas.            11.  Place clean sheets on your bed the night of your first shower and do not  sleep with pets. Day of Surgery : Do not apply any lotions/deodorants the morning of surgery.  Please wear clean clothes to the hospital/surgery center.  FAILURE TO FOLLOW THESE INSTRUCTIONS MAY RESULT IN THE CANCELLATION OF YOUR SURGERY PATIENT SIGNATURE_________________________________  NURSE SIGNATURE__________________________________  ________________________________________________________________________   Adam Phenix  An incentive spirometer is a tool that can help keep your lungs clear and active. This tool measures how well you are filling your lungs with each breath. Taking long deep breaths may help reverse or decrease the chance of developing breathing (pulmonary) problems (especially infection) following: A long period of time when you  are unable to move or be active. BEFORE THE PROCEDURE  If the spirometer includes an indicator to show your best effort, your nurse or respiratory therapist will set it to a desired goal. If possible, sit up straight or lean slightly forward. Try not to slouch. Hold the incentive spirometer in an upright position. INSTRUCTIONS FOR USE  Sit on the edge of your bed if possible, or sit up as far as you can in bed or on a chair. Hold the incentive spirometer in an upright position. Breathe out normally. Place the mouthpiece in your mouth and seal your lips tightly around it. Breathe in slowly and as deeply as possible, raising the piston or the ball toward the top of the column. Hold your breath for 3-5 seconds or for as long as possible. Allow the piston or ball to fall to the bottom of the column. Remove the mouthpiece from your mouth and breathe out normally. Rest for a few seconds and repeat Steps 1 through 7 at least 10 times every 1-2 hours when you are awake. Take your time and take a few normal breaths between deep breaths. The spirometer may include an indicator to show your best effort. Use the indicator as a goal to work toward during each repetition. After each set of 10 deep breaths, practice coughing to be sure your lungs are clear. If you have an incision (the cut made at the time of surgery), support your incision when coughing by placing a pillow or rolled up towels firmly against it. Once you are able to get out of bed, walk around indoors and cough well. You may stop using the incentive spirometer when instructed by your caregiver.  RISKS AND COMPLICATIONS Take your time so you do not get dizzy or light-headed. If you are in pain, you may need to take or ask  for pain medication before doing incentive spirometry. It is harder to take a deep breath if you are having pain. AFTER USE Rest and breathe slowly and easily. It can be helpful to keep track of a log of your progress. Your  caregiver can provide you with a simple table to help with this. If you are using the spirometer at home, follow these instructions: SEEK MEDICAL CARE IF:  You are having difficultly using the spirometer. You have trouble using the spirometer as often as instructed. Your pain medication is not giving enough relief while using the spirometer. You develop fever of 100.5 F (38.1 C) or higher. SEEK IMMEDIATE MEDICAL CARE IF:  You cough up bloody sputum that had not been present before. You develop fever of 102 F (38.9 C) or greater. You develop worsening pain at or near the incision site. MAKE SURE YOU:  Understand these instructions. Will watch your condition. Will get help right away if you are not doing well or get worse. Document Released: 07/07/2006 Document Revised: 05/19/2011 Document Reviewed: 09/07/2006 [facility] Patient Information 2014 [place], [place].   ________________________________________________________________________

## 2021-12-10 ENCOUNTER — Other Ambulatory Visit: Payer: Self-pay

## 2021-12-10 ENCOUNTER — Encounter (HOSPITAL_COMMUNITY)
Admission: RE | Admit: 2021-12-10 | Discharge: 2021-12-10 | Disposition: A | Discharge status: Home / Self Care | Payer: Medicare Other | Source: Ambulatory Visit | Attending: Orthopedic Surgery | Admitting: Orthopedic Surgery

## 2021-12-10 ENCOUNTER — Encounter (HOSPITAL_COMMUNITY): Payer: Self-pay

## 2021-12-10 VITALS — BP 125/67 | HR 72 | Temp 97.7°F | Ht 61.0 in | Wt 182.0 lb

## 2021-12-10 DIAGNOSIS — M1712 Unilateral primary osteoarthritis, left knee: Secondary | ICD-10-CM | POA: Insufficient documentation

## 2021-12-10 DIAGNOSIS — E109 Type 1 diabetes mellitus without complications: Secondary | ICD-10-CM

## 2021-12-10 DIAGNOSIS — Z794 Long term (current) use of insulin: Secondary | ICD-10-CM | POA: Insufficient documentation

## 2021-12-10 DIAGNOSIS — E1136 Type 2 diabetes mellitus with diabetic cataract: Secondary | ICD-10-CM | POA: Diagnosis not present

## 2021-12-10 DIAGNOSIS — Z9641 Presence of insulin pump (external) (internal): Secondary | ICD-10-CM | POA: Insufficient documentation

## 2021-12-10 DIAGNOSIS — Z01818 Encounter for other preprocedural examination: Secondary | ICD-10-CM | POA: Insufficient documentation

## 2021-12-10 LAB — GLUCOSE, CAPILLARY: Glucose-Capillary: 79 mg/dL (ref 70–99)

## 2021-12-10 LAB — BASIC METABOLIC PANEL
Anion gap: 8 (ref 5–15)
BUN: 25 mg/dL — ABNORMAL HIGH (ref 8–23)
CO2: 27 mmol/L (ref 22–32)
Calcium: 9.9 mg/dL (ref 8.9–10.3)
Chloride: 104 mmol/L (ref 98–111)
Creatinine, Ser: 1.03 mg/dL — ABNORMAL HIGH (ref 0.44–1.00)
GFR, Estimated: >60 mL/min (ref >60)
Glucose, Bld: 86 mg/dL (ref 70–99)
Potassium: 4.5 mmol/L (ref 3.5–5.1)
Sodium: 139 mmol/L (ref 135–145)

## 2021-12-10 LAB — CBC
HCT: 36.6 % (ref 36.0–46.0)
Hemoglobin: 11.9 g/dL — ABNORMAL LOW (ref 12.0–15.0)
MCH: 32.1 pg (ref 26.0–34.0)
MCHC: 32.5 g/dL (ref 30.0–36.0)
MCV: 98.7 fL (ref 80.0–100.0)
Platelets: 240 10*3/uL (ref 150–400)
RBC: 3.71 MIL/uL — ABNORMAL LOW (ref 3.87–5.11)
RDW: 11.7 % (ref 11.5–15.5)
WBC: 6.8 10*3/uL (ref 4.0–10.5)
nRBC: 0 % (ref 0.0–0.2)

## 2021-12-10 LAB — SURGICAL PCR SCREEN
MRSA, PCR: NEGATIVE
Staphylococcus aureus: NEGATIVE

## 2021-12-10 NOTE — Progress Notes (Addendum)
For Short Stay: [facility] appointment date: Date of COVID positive in last 71 days:  Bowel Prep reminder:   For Anesthesia: PCP - Dr. Steva Ready Avva: Clearance: 10/29/21: Chart Cardiologist -   Chest x-ray -  EKG - 09/17/21: EPIC Stress Test -  ECHO - 08/07/19 Cardiac Cath -  Pacemaker/ICD device last checked: Pacemaker orders received: Device Rep notified:  Spinal Cord Stimulator:  Sleep Study -  CPAP -   Fasting Blood Sugar - 113 Checks Blood Sugar: continuous CBG monitor./ Insulin pump Date and result of last Hgb A1c- 6.3: 09/17/21  Blood Thinner Instructions: Aspirin Instructions: Last Dose:  Activity level: Can go up a flight of stairs and activities of daily living without stopping and without chest pain and/or shortness of breath   Able to exercise without chest pain and/or shortness of breath   Unable to go up a flight of stairs without chest pain and/or shortness of breath     Anesthesia review: Hx: DIA 1,Heart murmur.  Patient denies shortness of breath, fever, cough and chest pain at PAT appointment   Patient verbalized understanding of instructions that were given to them at the PAT appointment. Patient was also instructed that they will need to review over the PAT instructions again at home before surgery.

## 2021-12-12 NOTE — Progress Notes (Signed)
Anesthesia Chart Review   Case: [id number] Date/Time: 12/23/21 0805   Procedure: TOTAL KNEE ARTHROPLASTY (Left: Knee)   Anesthesia type: Choice   Pre-op diagnosis: left knee osteoarthritis   Location: [facility] ROOM 10 / [facility]   Surgeons: Gaynelle Arabian, MD       DISCUSSION:61 y.o. former smoker with h/o DM II with insulin pump, left knee OA scheduled for abov eprocedure 12/23/2021 with Dr. Gaynelle Arabian.   Pt with extensive back surgeries.  Fusion with hardware in place L1-pelvis.   VS: BP 125/67   Pulse 72   Temp 36.5 C (Oral)   Ht 5\' 1"  (1.549 m)   Wt 82.6 kg   LMP  (LMP Unknown)   SpO2 100%   BMI 34.39 kg/m   PROVIDERS: Avva, Ravisankar, MD   LABS: Labs reviewed: Acceptable for surgery. (all labs ordered are listed, but only abnormal results are displayed)  Labs Reviewed  BASIC METABOLIC PANEL - Abnormal; Notable for the following components:      Result Value   BUN 25 (*)    Creatinine, Ser 1.03 (*)    All other components within normal limits  CBC - Abnormal; Notable for the following components:   RBC 3.71 (*)    Hemoglobin 11.9 (*)    All other components within normal limits  SURGICAL PCR SCREEN  GLUCOSE, CAPILLARY     IMAGES:   EKG:   CV: Echo 08/07/2019  1. Normal LV function; mild TR; mild pulmonary hypertension.   2. Left ventricular ejection fraction, by estimation, is 60 to 65%. The  left ventricle has normal function. The left ventricle has no regional  wall motion abnormalities. Left ventricular diastolic parameters were  normal.   3. Right ventricular systolic function is normal. The right ventricular  size is normal. There is mildly elevated pulmonary artery systolic  pressure.   4. The mitral valve is normal in structure. No evidence of mitral valve  regurgitation. No evidence of mitral stenosis.   5. The aortic valve is tricuspid. Aortic valve regurgitation is not  visualized. Mild aortic valve sclerosis is present, with no evidence of   aortic valve stenosis.   6. The inferior vena cava is normal in size with greater than 50%  respiratory variability, suggesting right atrial pressure of 3 mmHg.  Past Medical History:  Diagnosis Date   Anemia    Anxiety    Arthritis    Back pain    Back pain    De Quervain's tenosynovitis, right 01/2012   Degenerative disc disease, cervical    states neck is stiff, reduced range of motion   Dental crowns present    Depression    Diabetes mellitus    Insulin pump   GERD (gastroesophageal reflux disease)    Heart murmur    states has a functional murmur, and that she has never had any problems   Hypothyroidism    Joint pain    Osteoarthritis    PONV (postoperative nausea and vomiting)     Past Surgical History:  Procedure Laterality Date   ANTERIOR CERVICAL DISCECTOMY  02/20/2016   ANTERIOR LAT LUMBAR FUSION N/A 08/18/2016   Procedure: ANTERIOR LATERAL LUMBAR FUSION LUMBAR TWO- LUMBAR THREE, LUMBAR THREE- LUMBAR FOUR, LUMBAR FOUR- LUMBAR FIVE; LUMBAR TWO- LUMBAR FIVE PEDICLE SCREW FIXATION;  Surgeon: Jovita Gamma, MD;  Location: [place];  Service: Neurosurgery;  Laterality: N/A;  ANTERIOR LATERAL LUMBAR FUSION LUMBAR 2- LUMBAR 3, LUMBAR 3- LUMBAR 4-, LUMBAR 4- LUMBAR 5; LUMBAR 2-  LUMBAR 5 PEDICLE SCREW FIXATION   ANTERIOR LAT LUMBAR FUSION N/A 08/05/2019   Procedure: ANTERIOR LATERAL LUMBAR INTERBODY FUSION LUMBAR ONE-TWO.;  Surgeon: Donalee Citrin, MD;  Location: [facility];  Service: Neurosurgery;  Laterality: N/A;  anterolateral   APPLICATION OF INTRAOPERATIVE CT SCAN N/A 08/05/2019   Procedure: APPLICATION OF INTRAOPERATIVE CT SCAN;  Surgeon: Donalee Citrin, MD;  Location: [facility];  Service: Neurosurgery;  Laterality: N/A;   BACK SURGERY  07/2018   plif l5-s1   Cataracts Bilateral    CESAREAN SECTION     DORSAL COMPARTMENT RELEASE  02/08/2002   first dorsal compartment left wrist   DORSAL COMPARTMENT RELEASE  02/03/2012   Procedure: RELEASE DORSAL COMPARTMENT (DEQUERVAIN);  Surgeon: Nicki Reaper, MD;  Location: [facility];  Service: Orthopedics;  Laterality: Right;  RELEASE DEQUERVAINS RIGHT WRIST   KNEE ARTHROSCOPY  10/14/2004   right   LAMINECTOMY WITH POSTERIOR LATERAL ARTHRODESIS LEVEL 1 N/A 08/05/2019   Procedure: Posterior lateral fusion - Lumbar Five-Sacral One with pelvic fixation with iliac screws and Nuvasive instrumentation;  Surgeon: Donalee Citrin, MD;  Location: [facility] OR;  Service: Neurosurgery;  Laterality: N/A;  posterior   SHOULDER ARTHROSCOPY  05/05/2005   left   TRIGGER FINGER RELEASE  01/07/2011   release A1 pulley left thumb   TRIGGER FINGER RELEASE     x 5 other fingers    MEDICATIONS:  acetaminophen (TYLENOL) 325 MG tablet   albuterol (VENTOLIN HFA) 108 (90 Base) MCG/ACT inhaler   ALPRAZolam (XANAX) 0.5 MG tablet   atorvastatin (LIPITOR) 10 MG tablet   BAQSIMI TWO PACK 3 MG/DOSE POWD   buPROPion (WELLBUTRIN XL) 300 MG 24 hr tablet   Calcium Carb-Cholecalciferol (CALCIUM 500 + D PO)   Continuous Glucose Monitor DEVI   diclofenac Sodium (VOLTAREN) 1 % GEL   docusate sodium (COLACE) 100 MG capsule   etodolac (LODINE) 500 MG tablet   ferrous sulfate 325 (65 FE) MG tablet   fluticasone (FLONASE) 50 MCG/ACT nasal spray   gabapentin (NEURONTIN) 300 MG capsule   insulin aspart (NOVOLOG) 100 UNIT/ML injection   Insulin Human (INSULIN PUMP) SOLN   irbesartan (AVAPRO) 300 MG tablet   levocetirizine (XYZAL) 5 MG tablet   levothyroxine (SYNTHROID) 88 MCG tablet   Magnesium 250 MG TABS   metoCLOPramide (REGLAN) 5 MG tablet   Multiple Vitamins-Minerals (ADULT GUMMY PO)   Multiple Vitamins-Minerals (AIRBORNE PO)   ondansetron (ZOFRAN-ODT) 8 MG disintegrating tablet   oxyCODONE-acetaminophen (PERCOCET/ROXICET) 5-325 MG tablet   Polyvinyl Alcohol-Povidone (REFRESH OP)   potassium gluconate 595 (99 K) MG TABS tablet   Probiotic Product (FORTIFY DAILY PROBIOTIC) CAPS   tirzepatide (MOUNJARO) 5 MG/0.5ML Pen   vitamin C (ASCORBIC ACID) 250 MG tablet    No current facility-administered medications for this encounter.    Jodell Cipro Ward, PA-C [facility] [phone]

## 2021-12-19 ENCOUNTER — Ambulatory Visit (INDEPENDENT_AMBULATORY_CARE_PROVIDER_SITE_OTHER): Payer: Medicare Other | Admitting: Family Medicine

## 2021-12-19 ENCOUNTER — Encounter (INDEPENDENT_AMBULATORY_CARE_PROVIDER_SITE_OTHER): Payer: Self-pay | Admitting: Family Medicine

## 2021-12-19 VITALS — BP 119/73 | HR 74 | Temp 97.8°F | Ht 62.0 in | Wt 178.0 lb

## 2021-12-19 DIAGNOSIS — Z794 Long term (current) use of insulin: Secondary | ICD-10-CM

## 2021-12-19 DIAGNOSIS — E669 Obesity, unspecified: Secondary | ICD-10-CM | POA: Diagnosis not present

## 2021-12-19 DIAGNOSIS — Z6832 Body mass index (BMI) 32.0-32.9, adult: Secondary | ICD-10-CM

## 2021-12-19 DIAGNOSIS — E1069 Type 1 diabetes mellitus with other specified complication: Secondary | ICD-10-CM

## 2021-12-19 DIAGNOSIS — E038 Other specified hypothyroidism: Secondary | ICD-10-CM | POA: Diagnosis not present

## 2021-12-19 DIAGNOSIS — M1712 Unilateral primary osteoarthritis, left knee: Secondary | ICD-10-CM

## 2021-12-19 DIAGNOSIS — Z7985 Long-term (current) use of injectable non-insulin antidiabetic drugs: Secondary | ICD-10-CM

## 2021-12-21 NOTE — Anesthesia Preprocedure Evaluation (Addendum)
Anesthesia Evaluation    Reviewed: Allergy & Precautions, Patient's Chart, lab work & pertinent test results  History of Anesthesia Complications (+) PONV and history of anesthetic complications  Airway Mallampati: I  TM Distance: >3 FB Neck ROM: Full    Dental  (+) Teeth Intact, Dental Advisory Given   Pulmonary neg pulmonary ROS, former smoker,    breath sounds clear to auscultation       Cardiovascular hypertension, Pt. on medications + Valvular Problems/Murmurs  Rhythm:Regular Rate:Normal  Echo 08/07/2019 1. Normal LV function; mild TR; mild pulmonary hypertension.  2. Left ventricular ejection fraction, by estimation, is 60 to 65%. The  left ventricle has normal function. The left ventricle has no regional  wall motion abnormalities. Left ventricular diastolic parameters were  normal.  3. Right ventricular systolic function is normal. The right ventricular  size is normal. There is mildly elevated pulmonary artery systolic  pressure.  4. The mitral valve is normal in structure. No evidence of mitral valve  regurgitation. No evidence of mitral stenosis.  5. The aortic valve is tricuspid. Aortic valve regurgitation is not  visualized. Mild aortic valve sclerosis is present, with no evidence of  aortic valve stenosis.  6. The inferior vena cava is normal in size with greater than 50%  respiratory variability, suggesting right atrial pressure of 3 mmHg.    Neuro/Psych PSYCHIATRIC DISORDERS Anxiety Depression    GI/Hepatic Neg liver ROS, GERD  ,  Endo/Other  diabetes, Type 2, Insulin DependentHypothyroidism   Renal/GU      Musculoskeletal  (+) Arthritis , Osteoarthritis,    Abdominal Normal abdominal exam  (+)   Peds  Hematology negative hematology ROS (+)   Anesthesia Other Findings   Reproductive/Obstetrics                             Anesthesia Physical  Anesthesia  Plan  ASA: 3  Anesthesia Plan: General   Post-op Pain Management: Tylenol PO (pre-op)*, Celebrex PO (pre-op)*, Minimal or no pain anticipated and Regional block*   Induction: Intravenous  PONV Risk Score and Plan: 4 or greater and Ondansetron, Midazolam, Scopolamine patch - Pre-op, Treatment may vary due to age or medical condition and TIVA  Airway Management Planned: Oral ETT and LMA  Additional Equipment: None  Intra-op Plan:   Post-operative Plan: Extubation in OR  Informed Consent: I have reviewed the patients History and Physical, chart, labs and discussed the procedure including the risks, benefits and alternatives for the proposed anesthesia with the patient or authorized representative who has indicated his/her understanding and acceptance.     Dental advisory given  Plan Discussed with: CRNA, Anesthesiologist and Surgeon  Anesthesia Plan Comments: (PAT note written 07/29/2019 by Myra Gianotti, PA-C. DISCUSSION:60 y.o. former smoker with h/o DM II with insulin pump, left knee OA scheduled for abov eprocedure 12/23/2021 with Dr. Gaynelle Arabian.   Pt with extensive back surgeries.  Fusion with hardware in place L1-pelvis.  )      Anesthesia Quick Evaluation

## 2021-12-23 ENCOUNTER — Observation Stay (HOSPITAL_COMMUNITY)
Admission: RE | Admit: 2021-12-23 | Discharge: 2021-12-24 | Disposition: A | Discharge status: Home / Self Care | Payer: Medicare Other | Attending: Orthopedic Surgery | Admitting: Orthopedic Surgery

## 2021-12-23 ENCOUNTER — Encounter (HOSPITAL_COMMUNITY): Payer: Self-pay | Admitting: Orthopedic Surgery

## 2021-12-23 ENCOUNTER — Other Ambulatory Visit: Payer: Self-pay

## 2021-12-23 ENCOUNTER — Ambulatory Visit (HOSPITAL_BASED_OUTPATIENT_CLINIC_OR_DEPARTMENT_OTHER): Payer: Medicare Other | Admitting: Certified Registered Nurse Anesthetist

## 2021-12-23 ENCOUNTER — Ambulatory Visit (HOSPITAL_COMMUNITY): Payer: Medicare Other | Admitting: Physician Assistant

## 2021-12-23 ENCOUNTER — Encounter (HOSPITAL_COMMUNITY)
Admission: RE | Disposition: A | Discharge status: Home / Self Care | Payer: Self-pay | Source: Home / Self Care | Attending: Orthopedic Surgery

## 2021-12-23 DIAGNOSIS — E039 Hypothyroidism, unspecified: Secondary | ICD-10-CM | POA: Diagnosis not present

## 2021-12-23 DIAGNOSIS — M1712 Unilateral primary osteoarthritis, left knee: Secondary | ICD-10-CM

## 2021-12-23 DIAGNOSIS — Z79899 Other long term (current) drug therapy: Secondary | ICD-10-CM | POA: Diagnosis not present

## 2021-12-23 DIAGNOSIS — Z794 Long term (current) use of insulin: Secondary | ICD-10-CM | POA: Diagnosis not present

## 2021-12-23 DIAGNOSIS — Z87891 Personal history of nicotine dependence: Secondary | ICD-10-CM | POA: Diagnosis not present

## 2021-12-23 DIAGNOSIS — F418 Other specified anxiety disorders: Secondary | ICD-10-CM | POA: Diagnosis not present

## 2021-12-23 DIAGNOSIS — E109 Type 1 diabetes mellitus without complications: Secondary | ICD-10-CM | POA: Insufficient documentation

## 2021-12-23 DIAGNOSIS — I10 Essential (primary) hypertension: Secondary | ICD-10-CM

## 2021-12-23 HISTORY — PX: TOTAL KNEE ARTHROPLASTY: SHX125

## 2021-12-23 LAB — GLUCOSE, CAPILLARY
Glucose-Capillary: 150 mg/dL — ABNORMAL HIGH (ref 70–99)
Glucose-Capillary: 128 mg/dL — ABNORMAL HIGH (ref 70–99)
Glucose-Capillary: 196 mg/dL — ABNORMAL HIGH (ref 70–99)

## 2021-12-23 SURGERY — ARTHROPLASTY, KNEE, TOTAL
Anesthesia: General | Site: Knee | Laterality: Left

## 2021-12-23 MED ORDER — MIDAZOLAM HCL 2 MG/2ML IJ SOLN
INTRAMUSCULAR | Status: AC
  Filled 2021-12-23: qty 2

## 2021-12-23 MED ORDER — ALBUTEROL SULFATE (2.5 MG/3ML) 0.083% IN NEBU: 2.5000 mg | INHALATION_SOLUTION | RESPIRATORY_TRACT | Status: DC | PRN

## 2021-12-23 MED ORDER — ONDANSETRON HCL 4 MG PO TABS
4.0000 mg | ORAL_TABLET | Freq: Four times a day (QID) | ORAL | Status: DC | PRN
  Administered 2021-12-23: 4 mg via ORAL
  Filled 2021-12-23: qty 1

## 2021-12-23 MED ORDER — TRAMADOL HCL 50 MG PO TABS
50.0000 mg | ORAL_TABLET | Freq: Four times a day (QID) | ORAL | Status: DC | PRN
  Administered 2021-12-24: 100 mg via ORAL
  Filled 2021-12-23 (×2): qty 2

## 2021-12-23 MED ORDER — DEXAMETHASONE SODIUM PHOSPHATE 10 MG/ML IJ SOLN
INTRAMUSCULAR | Status: AC
  Filled 2021-12-23: qty 1

## 2021-12-23 MED ORDER — METOCLOPRAMIDE HCL 5 MG PO TABS: 5.0000 mg | ORAL_TABLET | Freq: Three times a day (TID) | ORAL | Status: DC | PRN

## 2021-12-23 MED ORDER — TRANEXAMIC ACID-NACL 1000-0.7 MG/100ML-% IV SOLN
1000.0000 mg | INTRAVENOUS | Status: AC
  Administered 2021-12-23: 1000 mg via INTRAVENOUS
  Filled 2021-12-23: qty 100

## 2021-12-23 MED ORDER — MEPERIDINE HCL 50 MG/ML IJ SOLN: 6.2500 mg | INTRAMUSCULAR | Status: DC | PRN

## 2021-12-23 MED ORDER — ACETAMINOPHEN 160 MG/5ML PO SOLN: 325.0000 mg | ORAL | Status: DC | PRN

## 2021-12-23 MED ORDER — LIDOCAINE HCL (PF) 2 % IJ SOLN
INTRAMUSCULAR | Status: AC
  Filled 2021-12-23: qty 5

## 2021-12-23 MED ORDER — GABAPENTIN 300 MG PO CAPS
300.0000 mg | ORAL_CAPSULE | Freq: Four times a day (QID) | ORAL | Status: DC
  Administered 2021-12-23 – 2021-12-24 (×5): 300 mg via ORAL
  Filled 2021-12-23 (×5): qty 1

## 2021-12-23 MED ORDER — DIPHENHYDRAMINE HCL 12.5 MG/5ML PO ELIX: 12.5000 mg | ORAL_SOLUTION | ORAL | Status: DC | PRN

## 2021-12-23 MED ORDER — VANCOMYCIN HCL IN DEXTROSE 1-5 GM/200ML-% IV SOLN
INTRAVENOUS | Status: AC
  Filled 2021-12-23: qty 200

## 2021-12-23 MED ORDER — FENTANYL CITRATE (PF) 100 MCG/2ML IJ SOLN
INTRAMUSCULAR | Status: DC | PRN
  Administered 2021-12-23: 25 ug via INTRAVENOUS
  Administered 2021-12-23: 100 ug via INTRAVENOUS
  Administered 2021-12-23: 75 ug via INTRAVENOUS
  Administered 2021-12-23: 50 ug via INTRAVENOUS

## 2021-12-23 MED ORDER — ASPIRIN 325 MG PO TBEC
325.0000 mg | DELAYED_RELEASE_TABLET | Freq: Two times a day (BID) | ORAL | Status: DC
  Administered 2021-12-24: 325 mg via ORAL
  Filled 2021-12-23: qty 1

## 2021-12-23 MED ORDER — CHLORHEXIDINE GLUCONATE 0.12 % MT SOLN
15.0000 mL | Freq: Once | OROMUCOSAL | Status: AC
  Administered 2021-12-23: 15 mL via OROMUCOSAL

## 2021-12-23 MED ORDER — PROPOFOL 1000 MG/100ML IV EMUL
INTRAVENOUS | Status: AC
  Filled 2021-12-23: qty 100

## 2021-12-23 MED ORDER — DEXAMETHASONE SODIUM PHOSPHATE 4 MG/ML IJ SOLN
INTRAMUSCULAR | Status: DC | PRN
  Administered 2021-12-23: 5 mg via INTRAVENOUS

## 2021-12-23 MED ORDER — FENTANYL CITRATE PF 50 MCG/ML IJ SOSY
50.0000 ug | PREFILLED_SYRINGE | INTRAMUSCULAR | Status: DC
  Administered 2021-12-23: 50 ug via INTRAVENOUS
  Filled 2021-12-23: qty 2

## 2021-12-23 MED ORDER — DEXAMETHASONE SODIUM PHOSPHATE 10 MG/ML IJ SOLN
8.0000 mg | Freq: Once | INTRAMUSCULAR | Status: AC
  Administered 2021-12-23: 10 mg via INTRAVENOUS

## 2021-12-23 MED ORDER — POLYETHYLENE GLYCOL 3350 17 G PO PACK: 17.0000 g | PACK | Freq: Every day | ORAL | Status: DC | PRN

## 2021-12-23 MED ORDER — LEVOTHYROXINE SODIUM 88 MCG PO TABS
88.0000 ug | ORAL_TABLET | Freq: Every day | ORAL | Status: DC
  Administered 2021-12-24: 88 ug via ORAL
  Filled 2021-12-23: qty 1

## 2021-12-23 MED ORDER — METHOCARBAMOL 500 MG IVPB - SIMPLE MED
INTRAVENOUS | Status: AC
  Filled 2021-12-23: qty 55

## 2021-12-23 MED ORDER — CELECOXIB 200 MG PO CAPS
200.0000 mg | ORAL_CAPSULE | Freq: Once | ORAL | Status: AC
  Administered 2021-12-23: 200 mg via ORAL
  Filled 2021-12-23: qty 1

## 2021-12-23 MED ORDER — VANCOMYCIN HCL IN DEXTROSE 1-5 GM/200ML-% IV SOLN
1000.0000 mg | INTRAVENOUS | Status: AC
  Administered 2021-12-23 (×2): 1000 mg via INTRAVENOUS
  Filled 2021-12-23: qty 200

## 2021-12-23 MED ORDER — INSULIN PUMP
SUBCUTANEOUS | Status: DC
  Administered 2021-12-24: 3 via SUBCUTANEOUS
  Administered 2021-12-24: 3.5 via SUBCUTANEOUS
  Filled 2021-12-23: qty 1

## 2021-12-23 MED ORDER — METHOCARBAMOL 500 MG PO TABS
500.0000 mg | ORAL_TABLET | Freq: Four times a day (QID) | ORAL | Status: DC | PRN
  Administered 2021-12-23 – 2021-12-24 (×3): 500 mg via ORAL
  Filled 2021-12-23 (×3): qty 1

## 2021-12-23 MED ORDER — LORATADINE 10 MG PO TABS
10.0000 mg | ORAL_TABLET | Freq: Every day | ORAL | Status: DC
  Administered 2021-12-24: 10 mg via ORAL
  Filled 2021-12-23: qty 1

## 2021-12-23 MED ORDER — OXYCODONE HCL 5 MG/5ML PO SOLN: 5.0000 mg | Freq: Once | ORAL | Status: DC | PRN

## 2021-12-23 MED ORDER — VANCOMYCIN HCL IN DEXTROSE 1-5 GM/200ML-% IV SOLN
1000.0000 mg | Freq: Two times a day (BID) | INTRAVENOUS | Status: AC
  Administered 2021-12-23: 1000 mg via INTRAVENOUS
  Filled 2021-12-23: qty 200

## 2021-12-23 MED ORDER — BUPIVACAINE LIPOSOME 1.3 % IJ SUSP
INTRAMUSCULAR | Status: DC | PRN
  Administered 2021-12-23: 20 mL

## 2021-12-23 MED ORDER — ONDANSETRON HCL 4 MG/2ML IJ SOLN
4.0000 mg | Freq: Four times a day (QID) | INTRAMUSCULAR | Status: DC | PRN
  Administered 2021-12-23: 4 mg via INTRAVENOUS
  Filled 2021-12-23: qty 2

## 2021-12-23 MED ORDER — MENTHOL 3 MG MT LOZG: 1.0000 | LOZENGE | OROMUCOSAL | Status: DC | PRN

## 2021-12-23 MED ORDER — ALBUTEROL SULFATE HFA 108 (90 BASE) MCG/ACT IN AERS
INHALATION_SPRAY | RESPIRATORY_TRACT | Status: AC
  Filled 2021-12-23: qty 6.7

## 2021-12-23 MED ORDER — POVIDONE-IODINE 10 % EX SWAB
2.0000 | Freq: Once | CUTANEOUS | Status: AC
  Administered 2021-12-23: 2 via TOPICAL

## 2021-12-23 MED ORDER — ONDANSETRON HCL 4 MG/2ML IJ SOLN: 4.0000 mg | Freq: Once | INTRAMUSCULAR | Status: DC | PRN

## 2021-12-23 MED ORDER — OXYCODONE HCL 5 MG PO TABS
10.0000 mg | ORAL_TABLET | ORAL | Status: DC | PRN
  Administered 2021-12-24: 15 mg via ORAL
  Administered 2021-12-24 (×2): 10 mg via ORAL
  Filled 2021-12-23: qty 3
  Filled 2021-12-23: qty 2

## 2021-12-23 MED ORDER — ALPRAZOLAM 0.5 MG PO TABS
0.5000 mg | ORAL_TABLET | Freq: Four times a day (QID) | ORAL | Status: DC | PRN
  Filled 2021-12-23: qty 1

## 2021-12-23 MED ORDER — PROPOFOL 500 MG/50ML IV EMUL
INTRAVENOUS | Status: AC
  Filled 2021-12-23: qty 50

## 2021-12-23 MED ORDER — ACETAMINOPHEN 500 MG PO TABS: 1000.0000 mg | ORAL_TABLET | Freq: Once | ORAL | Status: DC

## 2021-12-23 MED ORDER — OXYCODONE HCL 5 MG PO TABS: 5.0000 mg | ORAL_TABLET | Freq: Once | ORAL | Status: DC | PRN

## 2021-12-23 MED ORDER — ATORVASTATIN CALCIUM 10 MG PO TABS
10.0000 mg | ORAL_TABLET | Freq: Every day | ORAL | Status: DC
  Administered 2021-12-24: 10 mg via ORAL
  Filled 2021-12-23: qty 1

## 2021-12-23 MED ORDER — OXYCODONE HCL 5 MG PO TABS
5.0000 mg | ORAL_TABLET | ORAL | Status: DC | PRN
  Administered 2021-12-23 – 2021-12-24 (×4): 10 mg via ORAL
  Filled 2021-12-23 (×5): qty 2

## 2021-12-23 MED ORDER — DOCUSATE SODIUM 100 MG PO CAPS
100.0000 mg | ORAL_CAPSULE | Freq: Two times a day (BID) | ORAL | Status: DC
  Administered 2021-12-23 – 2021-12-24 (×2): 100 mg via ORAL
  Filled 2021-12-23 (×2): qty 1

## 2021-12-23 MED ORDER — SODIUM CHLORIDE (PF) 0.9 % IJ SOLN
INTRAMUSCULAR | Status: AC
  Filled 2021-12-23: qty 10

## 2021-12-23 MED ORDER — FERROUS SULFATE 325 (65 FE) MG PO TABS: 325.0000 mg | ORAL_TABLET | Freq: Every day | ORAL | Status: DC

## 2021-12-23 MED ORDER — ACETAMINOPHEN 325 MG PO TABS: 325.0000 mg | ORAL_TABLET | ORAL | Status: DC | PRN

## 2021-12-23 MED ORDER — METOCLOPRAMIDE HCL 5 MG/ML IJ SOLN
5.0000 mg | Freq: Three times a day (TID) | INTRAMUSCULAR | Status: DC | PRN
  Administered 2021-12-23: 10 mg via INTRAVENOUS
  Filled 2021-12-23: qty 2

## 2021-12-23 MED ORDER — EPHEDRINE 5 MG/ML INJ
INTRAVENOUS | Status: AC
  Filled 2021-12-23: qty 5

## 2021-12-23 MED ORDER — METHOCARBAMOL 500 MG IVPB - SIMPLE MED
500.0000 mg | Freq: Four times a day (QID) | INTRAVENOUS | Status: DC | PRN
  Administered 2021-12-23: 500 mg via INTRAVENOUS

## 2021-12-23 MED ORDER — IRBESARTAN 150 MG PO TABS
300.0000 mg | ORAL_TABLET | Freq: Every day | ORAL | Status: DC
  Administered 2021-12-24: 300 mg via ORAL
  Filled 2021-12-23: qty 2

## 2021-12-23 MED ORDER — BISACODYL 10 MG RE SUPP: 10.0000 mg | Freq: Every day | RECTAL | Status: DC | PRN

## 2021-12-23 MED ORDER — ROPIVACAINE HCL 7.5 MG/ML IJ SOLN
INTRAMUSCULAR | Status: DC | PRN
  Administered 2021-12-23: 20 mL via PERINEURAL

## 2021-12-23 MED ORDER — FENTANYL CITRATE PF 50 MCG/ML IJ SOSY
25.0000 ug | PREFILLED_SYRINGE | INTRAMUSCULAR | Status: DC | PRN
  Administered 2021-12-23: 50 ug via INTRAVENOUS

## 2021-12-23 MED ORDER — POTASSIUM GLUCONATE 595 (99 K) MG PO TABS
595.0000 mg | ORAL_TABLET | Freq: Every day | ORAL | Status: DC
  Filled 2021-12-23: qty 1

## 2021-12-23 MED ORDER — LACTATED RINGERS IV SOLN: INTRAVENOUS | Status: DC

## 2021-12-23 MED ORDER — FENTANYL CITRATE PF 50 MCG/ML IJ SOSY
PREFILLED_SYRINGE | INTRAMUSCULAR | Status: AC
  Filled 2021-12-23: qty 2

## 2021-12-23 MED ORDER — DEXAMETHASONE INJECTION ORDERABLE
Status: DC | PRN
  Administered 2021-12-23: 10 mg

## 2021-12-23 MED ORDER — SODIUM CHLORIDE 0.9 % IV SOLN: INTRAVENOUS | Status: DC

## 2021-12-23 MED ORDER — PROPOFOL 500 MG/50ML IV EMUL
INTRAVENOUS | Status: DC | PRN
  Administered 2021-12-23: 125 ug/kg/min via INTRAVENOUS

## 2021-12-23 MED ORDER — BUPROPION HCL ER (XL) 300 MG PO TB24
300.0000 mg | ORAL_TABLET | Freq: Every day | ORAL | Status: DC
  Administered 2021-12-24: 300 mg via ORAL
  Filled 2021-12-23: qty 1

## 2021-12-23 MED ORDER — SODIUM CHLORIDE 0.9 % IR SOLN
Status: DC | PRN
  Administered 2021-12-23 (×2): 1000 mL

## 2021-12-23 MED ORDER — ORAL CARE MOUTH RINSE: 15.0000 mL | Freq: Once | OROMUCOSAL | Status: AC

## 2021-12-23 MED ORDER — EPHEDRINE SULFATE-NACL 50-0.9 MG/10ML-% IV SOSY
PREFILLED_SYRINGE | INTRAVENOUS | Status: DC | PRN
  Administered 2021-12-23 (×2): 5 mg via INTRAVENOUS

## 2021-12-23 MED ORDER — ONDANSETRON HCL 4 MG/2ML IJ SOLN
INTRAMUSCULAR | Status: AC
  Filled 2021-12-23: qty 2

## 2021-12-23 MED ORDER — FENTANYL CITRATE (PF) 250 MCG/5ML IJ SOLN
INTRAMUSCULAR | Status: AC
  Filled 2021-12-23: qty 5

## 2021-12-23 MED ORDER — ACETAMINOPHEN 500 MG PO TABS
1000.0000 mg | ORAL_TABLET | Freq: Four times a day (QID) | ORAL | Status: AC
  Administered 2021-12-23 – 2021-12-24 (×3): 1000 mg via ORAL
  Filled 2021-12-23 (×3): qty 2

## 2021-12-23 MED ORDER — SODIUM CHLORIDE (PF) 0.9 % IJ SOLN
INTRAMUSCULAR | Status: DC | PRN
  Administered 2021-12-23: 60 mL

## 2021-12-23 MED ORDER — LIDOCAINE 2% (20 MG/ML) 5 ML SYRINGE
INTRAMUSCULAR | Status: DC | PRN
  Administered 2021-12-23: 60 mg via INTRAVENOUS

## 2021-12-23 MED ORDER — BUPIVACAINE LIPOSOME 1.3 % IJ SUSP: 20.0000 mL | Freq: Once | INTRAMUSCULAR | Status: DC

## 2021-12-23 MED ORDER — BUPIVACAINE LIPOSOME 1.3 % IJ SUSP
INTRAMUSCULAR | Status: AC
  Filled 2021-12-23: qty 20

## 2021-12-23 MED ORDER — PHENOL 1.4 % MT LIQD: 1.0000 | OROMUCOSAL | Status: DC | PRN

## 2021-12-23 MED ORDER — MIDAZOLAM HCL 2 MG/2ML IJ SOLN
1.0000 mg | INTRAMUSCULAR | Status: DC
  Administered 2021-12-23 (×2): 2 mg via INTRAVENOUS
  Filled 2021-12-23: qty 2

## 2021-12-23 MED ORDER — PROPOFOL 10 MG/ML IV BOLUS
INTRAVENOUS | Status: DC | PRN
  Administered 2021-12-23: 150 mg via INTRAVENOUS

## 2021-12-23 MED ORDER — SODIUM CHLORIDE (PF) 0.9 % IJ SOLN
INTRAMUSCULAR | Status: AC
  Filled 2021-12-23: qty 50

## 2021-12-23 MED ORDER — FLUTICASONE PROPIONATE 50 MCG/ACT NA SUSP: 2.0000 | Freq: Every day | NASAL | Status: DC | PRN

## 2021-12-23 MED ORDER — FLEET ENEMA 7-19 GM/118ML RE ENEM: 1.0000 | ENEMA | Freq: Once | RECTAL | Status: DC | PRN

## 2021-12-23 MED ORDER — ACETAMINOPHEN 10 MG/ML IV SOLN
1000.0000 mg | Freq: Four times a day (QID) | INTRAVENOUS | Status: DC
  Administered 2021-12-23: 1000 mg via INTRAVENOUS
  Filled 2021-12-23: qty 100

## 2021-12-23 MED ORDER — MORPHINE SULFATE (PF) 2 MG/ML IV SOLN
1.0000 mg | INTRAVENOUS | Status: DC | PRN
  Administered 2021-12-23: 1 mg via INTRAVENOUS
  Filled 2021-12-23: qty 1

## 2021-12-23 MED ORDER — ONDANSETRON HCL 4 MG/2ML IJ SOLN
INTRAMUSCULAR | Status: DC | PRN
  Administered 2021-12-23: 4 mg via INTRAVENOUS

## 2021-12-23 SURGICAL SUPPLY — 54 items
ATTUNE PS FEM LT SZ 5 CEM KNEE (Femur) IMPLANT
ATTUNE PSRP INSE SZ5 7 KNEE (Insert) IMPLANT
BAG COUNTER SPONGE SURGICOUNT (BAG) IMPLANT
BAG SPEC THK2 15X12 ZIP CLS (MISCELLANEOUS) ×1
BAG SPNG CNTER NS LX DISP (BAG) ×1
BAG ZIPLOCK 12X15 (MISCELLANEOUS) ×2 IMPLANT
BASEPLATE TIBIAL ROTATING SZ 4 (Knees) IMPLANT
BLADE SAG 18X100X1.27 (BLADE) ×2 IMPLANT
BLADE SAW SGTL 11.0X1.19X90.0M (BLADE) ×2 IMPLANT
BNDG ELASTIC 6X5.8 VLCR STR LF (GAUZE/BANDAGES/DRESSINGS) ×2 IMPLANT
BOWL SMART MIX CTS (DISPOSABLE) ×2 IMPLANT
BSPLAT TIB 4 CMNT ROT PLAT STR (Knees) ×1 IMPLANT
CEMENT HV SMART SET (Cement) ×4 IMPLANT
COVER SURGICAL LIGHT HANDLE (MISCELLANEOUS) ×2 IMPLANT
CUFF TOURN SGL QUICK 34 (TOURNIQUET CUFF) ×1
CUFF TRNQT CYL 34X4.125X (TOURNIQUET CUFF) ×2 IMPLANT
DRAPE INCISE IOBAN 66X45 STRL (DRAPES) ×2 IMPLANT
DRAPE U-SHAPE 47X51 STRL (DRAPES) ×2 IMPLANT
DRSG AQUACEL AG ADV 3.5X10 (GAUZE/BANDAGES/DRESSINGS) ×2 IMPLANT
DURAPREP 26ML APPLICATOR (WOUND CARE) ×2 IMPLANT
ELECT REM PT RETURN 15FT ADLT (MISCELLANEOUS) ×2 IMPLANT
GLOVE BIO SURGEON STRL SZ 6.5 (GLOVE) IMPLANT
GLOVE BIO SURGEON STRL SZ7.5 (GLOVE) IMPLANT
GLOVE BIO SURGEON STRL SZ8 (GLOVE) ×2 IMPLANT
GLOVE BIOGEL PI IND STRL 6.5 (GLOVE) IMPLANT
GLOVE BIOGEL PI IND STRL 7.0 (GLOVE) IMPLANT
GLOVE BIOGEL PI IND STRL 8 (GLOVE) ×2 IMPLANT
GOWN STRL REUS W/ TWL LRG LVL3 (GOWN DISPOSABLE) ×2 IMPLANT
GOWN STRL REUS W/ TWL XL LVL3 (GOWN DISPOSABLE) IMPLANT
GOWN STRL REUS W/TWL LRG LVL3 (GOWN DISPOSABLE) ×1
GOWN STRL REUS W/TWL XL LVL3 (GOWN DISPOSABLE)
HANDPIECE INTERPULSE COAX TIP (DISPOSABLE) ×1
HOLDER FOLEY CATH W/STRAP (MISCELLANEOUS) IMPLANT
IMMOBILIZER KNEE 20 (SOFTGOODS) ×1
IMMOBILIZER KNEE 20 THIGH 36 (SOFTGOODS) ×2 IMPLANT
KIT TURNOVER KIT A (KITS) IMPLANT
MANIFOLD NEPTUNE II (INSTRUMENTS) ×2 IMPLANT
NS IRRIG 1000ML POUR BTL (IV SOLUTION) ×2 IMPLANT
PACK TOTAL KNEE CUSTOM (KITS) ×2 IMPLANT
PADDING CAST COTTON 6X4 STRL (CAST SUPPLIES) ×4 IMPLANT
PATELLA MEDIAL ATTUN 35MM KNEE (Knees) IMPLANT
PROTECTOR NERVE ULNAR (MISCELLANEOUS) ×2 IMPLANT
SET HNDPC FAN SPRY TIP SCT (DISPOSABLE) ×2 IMPLANT
SPIKE FLUID TRANSFER (MISCELLANEOUS) ×2 IMPLANT
STRIP CLOSURE SKIN 1/2X4 (GAUZE/BANDAGES/DRESSINGS) ×4 IMPLANT
SUT MNCRL AB 4-0 PS2 18 (SUTURE) ×2 IMPLANT
SUT STRATAFIX 0 PDS 27 VIOLET (SUTURE) ×1
SUT VIC AB 2-0 CT1 27 (SUTURE) ×3
SUT VIC AB 2-0 CT1 TAPERPNT 27 (SUTURE) ×6 IMPLANT
SUTURE STRATFX 0 PDS 27 VIOLET (SUTURE) ×2 IMPLANT
TRAY FOLEY MTR SLVR 16FR STAT (SET/KITS/TRAYS/PACK) ×2 IMPLANT
TUBE SUCTION HIGH CAP CLEAR NV (SUCTIONS) ×2 IMPLANT
WATER STERILE IRR 1000ML POUR (IV SOLUTION) ×4 IMPLANT
WRAP KNEE MAXI GEL POST OP (GAUZE/BANDAGES/DRESSINGS) ×2 IMPLANT

## 2021-12-23 NOTE — Anesthesia Procedure Notes (Addendum)
  Anesthesia Regional Block: Adductor canal block   Pre-Anesthetic Checklist: , timeout performed,  Correct Patient, Correct Site, Correct Laterality,  Correct Procedure, Correct Position, site marked,  Risks and benefits discussed,  Surgical consent,  Pre-op evaluation,  At surgeon's request and post-op pain management  Laterality: Left  Prep: chloraprep       Needles:  Injection technique: Single-shot  Needle Type: Echogenic Stimulator Needle     Needle Length: 5cm  Needle Gauge: 22     Additional Needles:   Procedures:, nerve stimulator,,, ultrasound used (permanent image in chart),,    Narrative:  Start time: 12/23/2021 7:35 AM End time: 12/23/2021 7:40 AM Injection made incrementally with aspirations every 5 mL.  Performed by: Personally  Anesthesiologist: Janeece Riggers, MD  Additional Notes: Functioning IV was confirmed and monitors were applied.  A 35mm 22ga Arrow echogenic stimulator needle was used. Sterile prep and drape,hand hygiene and sterile gloves were used. Ultrasound guidance: relevant anatomy identified, needle position confirmed, local anesthetic spread visualized around nerve(s)., vascular puncture avoided.  Image printed for medical record. Negative aspiration and negative test dose prior to incremental administration of local anesthetic. The patient tolerated the procedure well.

## 2021-12-23 NOTE — Anesthesia Procedure Notes (Signed)
Procedure Name: LMA Insertion Date/Time: 12/23/2021 8:12 AM  Performed by: Claudia Desanctis, CRNAPre-anesthesia Checklist: Emergency Drugs available, Patient identified, Suction available and Patient being monitored Patient Re-evaluated:Patient Re-evaluated prior to induction Oxygen Delivery Method: Circle system utilized Preoxygenation: Pre-oxygenation with 100% oxygen Induction Type: IV induction Ventilation: Mask ventilation without difficulty LMA: LMA inserted and LMA with gastric port inserted LMA Size: 4.0 Number of attempts: 1 Placement Confirmation: positive ETCO2 and breath sounds checked- equal and bilateral Tube secured with: Tape Dental Injury: Teeth and Oropharynx as per pre-operative assessment

## 2021-12-23 NOTE — Evaluation (Signed)
Physical Therapy Evaluation Patient Details Name: Colleen Zuniga MRN: [id number] DOB: 03/26/1960 Today's Date: 12/23/2021  History of Present Illness  Pt is a 61 y.o. femlae s/p Lt TKA on 12/23/21.  PMH significant for anemia, anxiety, depression, DM, gerd, hypothyroidism, lumbar fusion (3875,6433), and  anterior cervical discectomy (2017).   Clinical Impression  Pt is POD 0 s/p Lt TKA resulting in the deficits listed below (see PT Problem List). Pt performed sit to stand transfers with MIN A and cues for safe hand placement. Pt performed step pivot transfer to [facility] and ambulated ~34ft to recliner with MIN A and use of RW. Pt limited this session by dizziness/lightheadedness, and persistent nausea/vomiting. Anticipate that pt will progress well with mobility in future sessions. PT reviewed circulation interventions for promotion of DVT prevention, pt demonstrated understanding. Pt will have assist from family members and friend upon d/c. Pt will benefit from skilled PT to maximize functional mobility to increase independence.         Recommendations for follow up therapy are one component of a multi-disciplinary discharge planning process, led by the attending physician.  Recommendations may be updated based on patient status, additional functional criteria and insurance authorization.  Follow Up Recommendations Follow physician's recommendations for discharge plan and follow up therapies      Assistance Recommended at Discharge Intermittent Supervision/Assistance  Patient can return home with the following  A little help with walking and/or transfers;A little help with bathing/dressing/bathroom;Assistance with cooking/housework;Help with stairs or ramp for entrance;Assist for transportation    Equipment Recommendations None recommended by PT (pt reports she owns RW)  Recommendations for Other Services       Functional Status Assessment Patient has had a recent decline in their  functional status and demonstrates the ability to make significant improvements in function in a reasonable and predictable amount of time.     Precautions / Restrictions Precautions Precautions: Fall Restrictions Weight Bearing Restrictions: No Other Position/Activity Restrictions: WBAT      Mobility  Bed Mobility Overal bed mobility: Needs Assistance Bed Mobility: Supine to Sit     Supine to sit: Min guard     General bed mobility comments: HOB very slightly elevated. use of log roll technique due to history of lumbar surgeries/pain. Increased time. Complaints of nausea, lightheaded/dizziness sitting EOB. Vitals 135/65, 75bpm. No changes in intensity of lightheadedness/dizziness throughout. Multiple bouts of emesis during session. RN notified and updated that pt able to void.    Transfers Overall transfer level: Needs assistance Equipment used: Rolling walker (2 wheels) Transfers: Sit to/from Stand, Bed to chair/wheelchair/BSC Sit to Stand: Min assist   Step pivot transfers: Min assist       General transfer comment: Cues for hand placement, increased time. Able to perform lateral weight shift in standing prior to performance of transfer. Cues for reaching back to [facility] surface to sit.    Ambulation/Gait Ambulation/Gait assistance: Min assist Gait Distance (Feet): 4 Feet Assistive device: Rolling walker (2 wheels) Gait Pattern/deviations: Step-to pattern, Decreased stride length, Decreased weight shift to left Gait velocity: decreased     General Gait Details: able to ambulate short distance from [facility] to recliner chair in room. Further mobility limited due to continued nausea/vomiting.  Stairs            Wheelchair Mobility    Modified Rankin (Stroke Patients Only)       Balance Overall balance assessment: Needs assistance Sitting-balance support: Feet supported Sitting balance-Leahy Scale: Good     Standing  balance support: Bilateral upper extremity  supported, During functional activity Standing balance-Leahy Scale: Poor Standing balance comment: relaint on at least single UE support for standing balance                             Pertinent Vitals/Pain Pain Assessment Pain Assessment: 0-10 Pain Score: 5  Pain Location: Lt knee Pain Descriptors / Indicators: Aching Pain Intervention(s): Limited activity within patient's tolerance, Monitored during session, Repositioned, Premedicated before session    Home Living Family/patient expects to be discharged to:: Private residence Living Arrangements: Spouse/significant other Available Help at Discharge: Family;Friend(s);Available 24 hours/day Type of Home: House Home Access: Level entry (1+1 front door, no steps garage entry)       Home Layout: One level Home Equipment: Conservation officer, nature (2 wheels);Cane - single point;BSC/3in1;Grab bars - toilet;Grab bars - tub/shower (has access to shower seat) Additional Comments: no AD use at baseline    Prior Function Prior Level of Function : Independent/Modified Independent;Driving                     Hand Dominance        Extremity/Trunk Assessment   Upper Extremity Assessment Upper Extremity Assessment: Overall WFL for tasks assessed    Lower Extremity Assessment Lower Extremity Assessment: LLE deficits/detail LLE Deficits / Details: able to perform AROM ankle pumps, SLR, fair quad set    Cervical / Trunk Assessment Cervical / Trunk Assessment: Normal  Communication   Communication: No difficulties  Cognition Arousal/Alertness: Awake/alert Behavior During Therapy: WFL for tasks assessed/performed Overall Cognitive Status: Within Functional Limits for tasks assessed                                          General Comments      Exercises Total Joint Exercises Ankle Circles/Pumps: AROM, Both, 20 reps, Seated   Assessment/Plan    PT Assessment Patient needs continued PT services  PT  Problem List Decreased strength;Decreased range of motion;Decreased activity tolerance;Decreased balance;Decreased mobility;Decreased knowledge of use of DME;Pain       PT Treatment Interventions DME instruction;Gait training;Stair training;Functional mobility training;Therapeutic activities;Therapeutic exercise;Balance training;Patient/family education    PT Goals (Current goals can be found in the Care Plan section)  Acute Rehab PT Goals Patient Stated Goal: feel better and progress with therapy PT Goal Formulation: With patient/family Time For Goal Achievement: 01/06/22 Potential to Achieve Goals: Good    Frequency 7X/week     Co-evaluation               AM-PAC PT "6 Clicks" Mobility  Outcome Measure Help needed turning from your back to your side while in a flat bed without using bedrails?: A Little Help needed moving from lying on your back to sitting on the side of a flat bed without using bedrails?: A Little Help needed moving to and from a bed to a chair (including a wheelchair)?: A Little Help needed standing up from a chair using your arms (e.g., wheelchair or bedside chair)?: A Little Help needed to walk in hospital room?: A Little Help needed climbing 3-5 steps with a railing? : A Lot 6 Click Score: 17    End of Session Equipment Utilized During Treatment: Gait belt Activity Tolerance: Treatment limited secondary to medical complications (Comment) Patient left: in chair;with call bell/phone within reach;with chair alarm  set;with family/visitor present;with nursing/sitter in room Nurse Communication: Mobility status;Other (comment) (symptoms during session and that pt able to void) PT Visit Diagnosis: Unsteadiness on feet (R26.81);Muscle weakness (generalized) (M62.81);Pain Pain - Right/Left: Left Pain - part of body: Knee    Time: 9798-9211 PT Time Calculation (min) (ACUTE ONLY): 61 min   Charges:   PT Evaluation $PT Eval Low Complexity: 1 Low PT  Treatments $Therapeutic Activity: 38-52 mins        Lyman Speller PT, DPT  Acute Rehabilitation Services  Office [phone]  12/23/2021, 3:41 PM

## 2021-12-23 NOTE — Care Plan (Signed)
Ortho Bundle Case Management Note  Patient Details  Name: Colleen Zuniga MRN: [id number] Date of Birth: 01/02/1961  L TKA on 12-23-21 DCP:  Home with husband.  Sister and neighbor will also be assisting.  DME:  No needs, has a RW PT:  [facility] on 12-26-21.                   DME Arranged:  N/A DME Agency:  NA  HH Arranged:  NA HH Agency:  NA  Additional Comments: Please contact me with any questions of if this plan should need to change.  Marianne Sofia, RN,CCM [facility]  [phone] 12/23/2021, 10:50 AM

## 2021-12-23 NOTE — Anesthesia Postprocedure Evaluation (Signed)
Anesthesia Post Note  Patient: Taygan Connell  Procedure(s) Performed: TOTAL KNEE ARTHROPLASTY (Left: Knee)     Patient location during evaluation: PACU Anesthesia Type: General Level of consciousness: awake and alert Pain management: pain level controlled Vital Signs Assessment: post-procedure vital signs reviewed and stable Respiratory status: spontaneous breathing, nonlabored ventilation, respiratory function stable and patient connected to nasal cannula oxygen Cardiovascular status: blood pressure returned to baseline and stable Postop Assessment: no apparent nausea or vomiting Anesthetic complications: no   No notable events documented.  Last Vitals:  Vitals:   12/23/21 1100 12/23/21 1402  BP: 135/60 (!) 141/61  Pulse: 78 76  Resp: 12   Temp: 36.7 C   SpO2: 96% 100%    Last Pain:  Vitals:   12/23/21 1428  TempSrc:   PainSc: 3                  [place]

## 2021-12-23 NOTE — Plan of Care (Signed)
  Problem: Education: Goal: Knowledge of the prescribed therapeutic regimen will improve Outcome: Progressing   Problem: Activity: Goal: Ability to avoid complications of mobility impairment will improve Outcome: Progressing   Problem: Pain Management: Goal: Pain level will decrease with appropriate interventions Outcome: Progressing   

## 2021-12-23 NOTE — Progress Notes (Signed)
Orthopedic Tech Progress Note Patient Details:  Colleen Zuniga 06-11-1960 [id number]  CPM Left Knee CPM Left Knee: On Left Knee Flexion (Degrees): 40 Left Knee Extension (Degrees): 10  Post Interventions Patient Tolerated: Well Instructions Provided: Care of device, Adjustment of device  [name] 12/23/2021, 9:38 AM

## 2021-12-23 NOTE — Transfer of Care (Signed)
Immediate Anesthesia Transfer of Care Note  Patient: Colleen Zuniga  Procedure(s) Performed: TOTAL KNEE ARTHROPLASTY (Left: Knee)  Patient Location: PACU  Anesthesia Type:General  Level of Consciousness: drowsy  Airway & Oxygen Therapy: Patient Spontanous Breathing and Patient connected to face mask  Post-op Assessment: Report given to RN and Post -op Vital signs reviewed and stable  Post vital signs: Reviewed and stable  Last Vitals:  Vitals Value Taken Time  BP 129/59 12/23/21 0939  Temp    Pulse 66 12/23/21 0940  Resp 9 12/23/21 0940  SpO2 100 % 12/23/21 0940  Vitals shown include unvalidated device data.  Last Pain:  Vitals:   12/23/21 0623  TempSrc: Oral  PainSc:       Patients Stated Pain Goal: 5 (29/19/16 6060)  Complications: No notable events documented.

## 2021-12-23 NOTE — Discharge Instructions (Signed)
 Frank Aluisio, MD Total Joint Specialist [facility] [address] [place],  [address] [phone]  TOTAL KNEE REPLACEMENT POSTOPERATIVE DIRECTIONS    Knee Rehabilitation, Guidelines Following Surgery  Results after knee surgery are often greatly improved when you follow the exercise, range of motion and muscle strengthening exercises prescribed by your doctor. Safety measures are also important to protect the knee from further injury. If any of these exercises cause you to have increased pain or swelling in your knee joint, decrease the amount until you are comfortable again and slowly increase them. If you have problems or questions, call your caregiver or physical therapist for advice.   BLOOD CLOT PREVENTION Take a 325 mg Aspirin two times a day for three weeks following surgery. Then take an 81 mg Aspirin once a day for three weeks. Then discontinue Aspirin. You may resume your vitamins/supplements upon discharge from the hospital. Do not take any NSAIDs (Advil, Aleve, Ibuprofen, Meloxicam, etc.) until you have discontinued the 325 mg Aspirin.  HOME CARE INSTRUCTIONS  Remove items at home which could result in a fall. This includes throw rugs or furniture in walking pathways.  ICE to the affected knee as much as tolerated. Icing helps control swelling. If the swelling is well controlled you will be more comfortable and rehab easier. Continue to use ice on the knee for pain and swelling from surgery. You may notice swelling that will progress down to the foot and ankle. This is normal after surgery. Elevate the leg when you are not up walking on it.    Continue to use the breathing machine which will help keep your temperature down. It is common for your temperature to cycle up and down following surgery, especially at night when you are not up moving around and exerting yourself. The breathing machine keeps your lungs expanded and your temperature  down. Do not place pillow under the operative knee, focus on keeping the knee straight while resting  DIET You may resume your previous home diet once you are discharged from the hospital.  DRESSING / WOUND CARE / SHOWERING Keep your bulky bandage on for 2 days. On the third post-operative day you may remove the Ace bandage and gauze. There is a waterproof adhesive bandage on your skin which will stay in place until your first follow-up appointment. Once you remove this you will not need to place another bandage You may begin showering 3 days following surgery, but do not submerge the incision under water.  ACTIVITY For the first 5 days, the key is rest and control of pain and swelling Do your home exercises twice a day starting on post-operative day 3. On the days you go to physical therapy, just do the home exercises once that day. You should rest, ice and elevate the leg for 50 minutes out of every hour. Get up and walk/stretch for 10 minutes per hour. After 5 days you can increase your activity slowly as tolerated. Walk with your walker as instructed. Use the walker until you are comfortable transitioning to a cane. Walk with the cane in the opposite hand of the operative leg. You may discontinue the cane once you are comfortable and walking steadily. Avoid periods of inactivity such as sitting longer than an hour when not asleep. This helps prevent blood clots.  You may discontinue the knee immobilizer once you are able to perform a straight leg raise while lying down. You may resume a sexual relationship in one month   or when given the OK by your doctor.  You may return to work once you are cleared by your doctor.  Do not drive a car for 6 weeks or until released by your surgeon.  Do not drive while taking narcotics.  TED HOSE STOCKINGS Wear the elastic stockings on both legs for three weeks following surgery during the day. You may remove them at night for sleeping.  WEIGHT  BEARING Weight bearing as tolerated with assist device (walker, cane, etc) as directed, use it as long as suggested by your surgeon or therapist, typically at least 4-6 weeks.  POSTOPERATIVE CONSTIPATION PROTOCOL Constipation - defined medically as fewer than three stools per week and severe constipation as less than one stool per week.  One of the most common issues patients have following surgery is constipation.  Even if you have a regular bowel pattern at home, your normal regimen is likely to be disrupted due to multiple reasons following surgery.  Combination of anesthesia, postoperative narcotics, change in appetite and fluid intake all can affect your bowels.  In order to avoid complications following surgery, here are some recommendations in order to help you during your recovery period.  Colace (docusate) - Pick up an over-the-counter form of Colace or another stool softener and take twice a day as long as you are requiring postoperative pain medications.  Take with a full glass of water daily.  If you experience loose stools or diarrhea, hold the colace until you stool forms back up. If your symptoms do not get better within 1 week or if they get worse, check with your doctor. Dulcolax (bisacodyl) - Pick up over-the-counter and take as directed by the product packaging as needed to assist with the movement of your bowels.  Take with a full glass of water.  Use this product as needed if not relieved by Colace only.  MiraLax (polyethylene glycol) - Pick up over-the-counter to have on hand. MiraLax is a solution that will increase the amount of water in your bowels to assist with bowel movements.  Take as directed and can mix with a glass of water, juice, soda, coffee, or tea. Take if you go more than two days without a movement. Do not use MiraLax more than once per day. Call your doctor if you are still constipated or irregular after using this medication for 7 days in a row.  If you continue  to have problems with postoperative constipation, please contact the office for further assistance and recommendations.  If you experience "the worst abdominal pain ever" or develop nausea or vomiting, please contact the office immediatly for further recommendations for treatment.  ITCHING If you experience itching with your medications, try taking only a single pain pill, or even half a pain pill at a time.  You can also use Benadryl over the counter for itching or also to help with sleep.   MEDICATIONS See your medication summary on the "After Visit Summary" that the nursing staff will review with you prior to discharge.  You may have some home medications which will be placed on hold until you complete the course of blood thinner medication.  It is important for you to complete the blood thinner medication as prescribed by your surgeon.  Continue your approved medications as instructed at time of discharge.  PRECAUTIONS If you experience chest pain or shortness of breath - call 911 immediately for transfer to the hospital emergency department.  If you develop a fever greater that 101 F,   purulent drainage from wound, increased redness or drainage from wound, foul odor from the wound/dressing, or calf pain - CONTACT YOUR SURGEON.                                                   FOLLOW-UP APPOINTMENTS Make sure you keep all of your appointments after your operation with your surgeon and caregivers. You should call the office at the above phone number and make an appointment for approximately two weeks after the date of your surgery or on the date instructed by your surgeon outlined in the "After Visit Summary".  RANGE OF MOTION AND STRENGTHENING EXERCISES  Rehabilitation of the knee is important following a knee injury or an operation. After just a few days of immobilization, the muscles of the thigh which control the knee become weakened and shrink (atrophy). Knee exercises are designed to build up  the tone and strength of the thigh muscles and to improve knee motion. Often times heat used for twenty to thirty minutes before working out will loosen up your tissues and help with improving the range of motion but do not use heat for the first two weeks following surgery. These exercises can be done on a training (exercise) mat, on the floor, on a table or on a bed. Use what ever works the best and is most comfortable for you Knee exercises include:  Leg Lifts - While your knee is still immobilized in a splint or cast, you can do straight leg raises. Lift the leg to 60 degrees, hold for 3 sec, and slowly lower the leg. Repeat 10-20 times 2-3 times daily. Perform this exercise against resistance later as your knee gets better.  Quad and Hamstring Sets - Tighten up the muscle on the front of the thigh (Quad) and hold for 5-10 sec. Repeat this 10-20 times hourly. Hamstring sets are done by pushing the foot backward against an object and holding for 5-10 sec. Repeat as with quad sets.  Leg Slides: Lying on your back, slowly slide your foot toward your buttocks, bending your knee up off the floor (only go as far as is comfortable). Then slowly slide your foot back down until your leg is flat on the floor again. Angel Wings: Lying on your back spread your legs to the side as far apart as you can without causing discomfort.  A rehabilitation program following serious knee injuries can speed recovery and prevent re-injury in the future due to weakened muscles. Contact your doctor or a physical therapist for more information on knee rehabilitation.   POST-OPERATIVE OPIOID TAPER INSTRUCTIONS: It is important to wean off of your opioid medication as soon as possible. If you do not need pain medication after your surgery it is ok to stop day one. Opioids include: Codeine, Hydrocodone(Norco, Vicodin), Oxycodone(Percocet, oxycontin) and hydromorphone amongst others.  Long term and even short term use of opiods can  cause: Increased pain response Dependence Constipation Depression Respiratory depression And more.  Withdrawal symptoms can include Flu like symptoms Nausea, vomiting And more Techniques to manage these symptoms Hydrate well Eat regular healthy meals Stay active Use relaxation techniques(deep breathing, meditating, yoga) Do Not substitute Alcohol to help with tapering If you have been on opioids for less than two weeks and do not have pain than it is ok to stop all together.  Plan   to wean off of opioids This plan should start within one week post op of your joint replacement. Maintain the same interval or time between taking each dose and first decrease the dose.  Cut the total daily intake of opioids by one tablet each day Next start to increase the time between doses. The last dose that should be eliminated is the evening dose.   IF YOU ARE TRANSFERRED TO A SKILLED REHAB FACILITY If the patient is transferred to a skilled rehab facility following release from the hospital, a list of the current medications will be sent to the facility for the patient to continue.  When discharged from the skilled rehab facility, please have the facility set up the patient's Home Health Physical Therapy prior to being released. Also, the skilled facility will be responsible for providing the patient with their medications at time of release from the facility to include their pain medication, the muscle relaxants, and their blood thinner medication. If the patient is still at the rehab facility at time of the two week follow up appointment, the skilled rehab facility will also need to assist the patient in arranging follow up appointment in our office and any transportation needs.  MAKE SURE YOU:  Understand these instructions.  Get help right away if you are not doing well or get worse.   DENTAL ANTIBIOTICS:  In most cases prophylactic antibiotics for Dental procdeures after total joint surgery are  not necessary.  Exceptions are as follows:  1. History of prior total joint infection  2. Severely immunocompromised (Organ Transplant, cancer chemotherapy, Rheumatoid biologic meds such as Humera)  3. Poorly controlled diabetes (A1C &gt; 8.0, blood glucose over 200)  If you have one of these conditions, contact your surgeon for an antibiotic prescription, prior to your dental procedure.    Pick up stool softner and laxative for home use following surgery while on pain medications. Do not submerge incision under water. Please use good hand washing techniques while changing dressing each day. May shower starting three days after surgery. Please use a clean towel to pat the incision dry following showers. Continue to use ice for pain and swelling after surgery. Do not use any lotions or creams on the incision until instructed by your surgeon.  

## 2021-12-23 NOTE — Interval H&P Note (Signed)
History and Physical Interval Note:  12/23/2021 6:32 AM  Colleen Zuniga  has presented today for surgery, with the diagnosis of left knee osteoarthritis.  The various methods of treatment have been discussed with the patient and family. After consideration of risks, benefits and other options for treatment, the patient has consented to  Procedure(s): TOTAL KNEE ARTHROPLASTY (Left) as a surgical intervention.  The patient's history has been reviewed, patient examined, no change in status, stable for surgery.  I have reviewed the patient's chart and labs.  Questions were answered to the patient's satisfaction.     Pilar Plate Jocelyne Reinertsen

## 2021-12-23 NOTE — Plan of Care (Signed)
Plan of care reviewed and discussed with the patient. 

## 2021-12-23 NOTE — Op Note (Signed)
OPERATIVE REPORT-TOTAL KNEE ARTHROPLASTY   Pre-operative diagnosis- Osteoarthritis  Left knee(s)  Post-operative diagnosis- Osteoarthritis Left knee(s)  Procedure-  Left  Total Knee Arthroplasty  Surgeon- Gus Rankin. Rovena Hearld, MD  Assistant- Marylou Flesher, PA-C   Anesthesia-  General and Regional  EBL-25 mL   Drains None  Tourniquet time-  Total Tourniquet Time Documented: Thigh (Left) - 36 minutes Total: Thigh (Left) - 36 minutes     Complications- None  Condition-PACU - hemodynamically stable.   Brief Clinical Note  Colleen Zuniga is a 61 y.o. year old female with end stage OA of her left knee with progressively worsening pain and dysfunction. She has constant pain, with activity and at rest and significant functional deficits with difficulties even with ADLs. She has had extensive non-op management including analgesics, injections of cortisone and viscosupplements, and home exercise program, but remains in significant pain with significant dysfunction. Radiographs show bone on bone arthritis medial and patellofemoral. She presents now for left Total Knee Arthroplasty.     Procedure in detail---   The patient is brought into the operating room and positioned supine on the operating table. After successful administration of  General and Regional,   a tourniquet is placed high on the  Left thigh(s) and the lower extremity is prepped and draped in the usual sterile fashion. Time out is performed by the operating team and then the  Left lower extremity is wrapped in Esmarch, knee flexed and the tourniquet inflated to 300 mmHg.       A midline incision is made with a ten blade through the subcutaneous tissue to the level of the extensor mechanism. A fresh blade is used to make a medial parapatellar arthrotomy. Soft tissue over the proximal medial tibia is subperiosteally elevated to the joint line with a knife and into the semimembranosus bursa with a Cobb elevator. Soft tissue  over the proximal lateral tibia is elevated with attention being paid to avoiding the patellar tendon on the tibial tubercle. The patella is everted, knee flexed 90 degrees and the ACL and PCL are removed. Findings are bone on bone medial and patellofemoral with large global osteophytes        The drill is used to create a starting hole in the distal femur and the canal is thoroughly irrigated with sterile saline to remove the fatty contents. The 5 degree Left  valgus alignment guide is placed into the femoral canal and the distal femoral cutting block is pinned to remove 9 mm off the distal femur. Resection is made with an oscillating saw.      The tibia is subluxed forward and the menisci are removed. The extramedullary alignment guide is placed referencing proximally at the medial aspect of the tibial tubercle and distally along the second metatarsal axis and tibial crest. The block is pinned to remove 40mm off the more deficient medial  side. Resection is made with an oscillating saw. Size 4is the most appropriate size for the tibia and the proximal tibia is prepared with the modular drill and keel punch for that size.      The femoral sizing guide is placed and size 5 is most appropriate. Rotation is marked off the epicondylar axis and confirmed by creating a rectangular flexion gap at 90 degrees. The size 5 cutting block is pinned in this rotation and the anterior, posterior and chamfer cuts are made with the oscillating saw. The intercondylar block is then placed and that cut is made.  Trial size 4 tibial component, trial size 5 posterior stabilized femur and a 7  mm posterior stabilized rotating platform insert trial is placed. Full extension is achieved with excellent varus/valgus and anterior/posterior balance throughout full range of motion. The patella is everted and thickness measured to be 22  mm. Free hand resection is taken to 12 mm, a 35 template is placed, lug holes are drilled, trial  patella is placed, and it tracks normally. Osteophytes are removed off the posterior femur with the trial in place. All trials are removed and the cut bone surfaces prepared with pulsatile lavage. Cement is mixed and once ready for implantation, the size 4 tibial implant, size  5 posterior stabilized femoral component, and the size 35 patella are cemented in place and the patella is held with the clamp. The trial insert is placed and the knee held in full extension. The Exparel (20 ml mixed with 60 ml saline) is injected into the extensor mechanism, posterior capsule, medial and lateral gutters and subcutaneous tissues.  All extruded cement is removed and once the cement is hard the permanent 7 mm posterior stabilized rotating platform insert is placed into the tibial tray.      The wound is copiously irrigated with saline solution and the extensor mechanism closed with # 0 Stratofix suture. The tourniquet is released for a total tourniquet time of 36  minutes. Flexion against gravity is 140 degrees and the patella tracks normally. Subcutaneous tissue is closed with 2.0 vicryl and subcuticular with running 4.0 Monocryl. The incision is cleaned and dried and steri-strips and a bulky sterile dressing are applied. The limb is placed into a knee immobilizer and the patient is awakened and transported to recovery in stable condition.      Please note that a surgical assistant was a medical necessity for this procedure in order to perform it in a safe and expeditious manner. Surgical assistant was necessary to retract the ligaments and vital neurovascular structures to prevent injury to them and also necessary for proper positioning of the limb to allow for anatomic placement of the prosthesis.   Dione Plover Russell Quinney, MD    12/23/2021, 9:23 AM

## 2021-12-24 ENCOUNTER — Encounter (HOSPITAL_COMMUNITY): Payer: Self-pay | Admitting: Orthopedic Surgery

## 2021-12-24 DIAGNOSIS — M1712 Unilateral primary osteoarthritis, left knee: Secondary | ICD-10-CM | POA: Diagnosis not present

## 2021-12-24 LAB — CBC
HCT: 31.1 % — ABNORMAL LOW (ref 36.0–46.0)
Hemoglobin: 10.2 g/dL — ABNORMAL LOW (ref 12.0–15.0)
MCH: 32.7 pg (ref 26.0–34.0)
MCHC: 32.8 g/dL (ref 30.0–36.0)
MCV: 99.7 fL (ref 80.0–100.0)
Platelets: 201 10*3/uL (ref 150–400)
RBC: 3.12 MIL/uL — ABNORMAL LOW (ref 3.87–5.11)
RDW: 11.8 % (ref 11.5–15.5)
WBC: 9 10*3/uL (ref 4.0–10.5)
nRBC: 0 % (ref 0.0–0.2)

## 2021-12-24 LAB — BASIC METABOLIC PANEL
Anion gap: 6 (ref 5–15)
BUN: 16 mg/dL (ref 8–23)
CO2: 24 mmol/L (ref 22–32)
Calcium: 9.2 mg/dL (ref 8.9–10.3)
Chloride: 105 mmol/L (ref 98–111)
Creatinine, Ser: 0.76 mg/dL (ref 0.44–1.00)
GFR, Estimated: >60 mL/min (ref >60)
Glucose, Bld: 209 mg/dL — ABNORMAL HIGH (ref 70–99)
Potassium: 5.4 mmol/L — ABNORMAL HIGH (ref 3.5–5.1)
Sodium: 135 mmol/L (ref 135–145)

## 2021-12-24 MED ORDER — TRAMADOL HCL 50 MG PO TABS: 50.0000 mg | ORAL_TABLET | Freq: Four times a day (QID) | ORAL | 0 refills | Status: DC | PRN

## 2021-12-24 MED ORDER — OXYCODONE HCL 5 MG PO TABS: 5.0000 mg | ORAL_TABLET | Freq: Four times a day (QID) | ORAL | 0 refills | Status: DC | PRN

## 2021-12-24 MED ORDER — ASPIRIN 325 MG PO TBEC: 325.0000 mg | DELAYED_RELEASE_TABLET | Freq: Two times a day (BID) | ORAL | 0 refills | Status: AC

## 2021-12-24 MED ORDER — METHOCARBAMOL 500 MG PO TABS: 500.0000 mg | ORAL_TABLET | Freq: Four times a day (QID) | ORAL | 0 refills | Status: DC | PRN

## 2021-12-24 MED ORDER — ASPIRIN 325 MG PO TBEC: 325.0000 mg | DELAYED_RELEASE_TABLET | Freq: Two times a day (BID) | ORAL | 0 refills | Status: DC

## 2021-12-24 NOTE — Progress Notes (Signed)
Physical Therapy Treatment Patient Details Name: Rozalia Dino MRN: [id number] DOB: [birth year] Today's Date: 12/24/2021   History of Present Illness Pt is a 61 y.o. femlae s/p Lt TKA on 12/23/21.  PMh significant for anemia, anxiety, depression, DM, gerd, hypothyroidism, lumbar fusion (4742,5956), and  anterior cervical discectomy (2017).    PT Comments    Pain controlled. Pt tolerated session well. Will plan to have a 2nd session prior to d/c home later today   Recommendations for follow up therapy are one component of a multi-disciplinary discharge planning process, led by the attending physician.  Recommendations may be updated based on patient status, additional functional criteria and insurance authorization.  Follow Up Recommendations  Follow physician's recommendations for discharge plan and follow up therapies     Assistance Recommended at Discharge Intermittent Supervision/Assistance  Patient can return home with the following A little help with walking and/or transfers;A little help with bathing/dressing/bathroom;Assistance with cooking/housework;Help with stairs or ramp for entrance;Assist for transportation   Equipment Recommendations       Recommendations for Other Services       Precautions / Restrictions Precautions Precautions: Fall Restrictions Weight Bearing Restrictions: No Other Position/Activity Restrictions: WBAT     Mobility  Bed Mobility Overal bed mobility: Modified Independent Bed Mobility: Sit to Supine       Sit to supine: Modified independent (Device/Increase time)   General bed mobility comments: No assist required.    Transfers Overall transfer level: Needs assistance Equipment used: Rolling walker (2 wheels) Transfers: Sit to/from Stand Sit to Stand: Min guard           General transfer comment: Min guard for safety. Cues for safety, hand/LE placement    Ambulation/Gait Ambulation/Gait assistance: Min guard Gait  Distance (Feet): 75 Feet Assistive device: Rolling walker (2 wheels) Gait Pattern/deviations: Step-to pattern       General Gait Details: Min guard for safety. Slow but steady gait. No nausea or dizziness. Pt tolerated distance well.   Stairs             Wheelchair Mobility    Modified Rankin (Stroke Patients Only)       Balance Overall balance assessment: Needs assistance         Standing balance support: Bilateral upper extremity supported, During functional activity Standing balance-Leahy Scale: Fair                              Cognition Arousal/Alertness: Awake/alert Behavior During Therapy: WFL for tasks assessed/performed Overall Cognitive Status: Within Functional Limits for tasks assessed                                          Exercises Total Joint Exercises Ankle Circles/Pumps: AROM, Both, 10 reps Quad Sets: AROM, Both, 10 reps Hip ABduction/ADduction: AROM, Left, 10 reps Straight Leg Raises: AROM, Left, 10 reps Long Arc Quad: AROM, Left, 10 reps, Seated Goniometric ROM: ~10-65 degrees    General Comments        Pertinent Vitals/Pain Pain Assessment Pain Assessment: Faces Faces Pain Scale: Hurts little more Pain Location: Lt knee Pain Descriptors / Indicators: Discomfort, Sore, Spasm Pain Intervention(s): Limited activity within patient's tolerance, Monitored during session, Ice applied    Home Living  Prior Function            PT Goals (current goals can now be found in the care plan section) Progress towards PT goals: Progressing toward goals    Frequency    7X/week      PT Plan Current plan remains appropriate    Co-evaluation              AM-PAC PT "6 Clicks" Mobility   Outcome Measure  Help needed turning from your back to your side while in a flat bed without using bedrails?: None Help needed moving from lying on your back to sitting on the  side of a flat bed without using bedrails?: None Help needed moving to and from a bed to a chair (including a wheelchair)?: A Little Help needed standing up from a chair using your arms (e.g., wheelchair or bedside chair)?: A Little Help needed to walk in hospital room?: A Little Help needed climbing 3-5 steps with a railing? : A Little 6 Click Score: 20    End of Session Equipment Utilized During Treatment: Gait belt Activity Tolerance: Patient tolerated treatment well Patient left: in bed;with call bell/phone within reach;with family/visitor present   PT Visit Diagnosis: Unsteadiness on feet (R26.81);Muscle weakness (generalized) (M62.81);Pain Pain - Right/Left: Left Pain - part of body: Knee     Time: 1062-6948 PT Time Calculation (min) (ACUTE ONLY): 35 min  Charges:  $Gait Training: 8-22 mins $Therapeutic Exercise: 8-22 mins                         Faye Ramsay, PT Acute Rehabilitation  Office: [phone] Pager: [phone]

## 2021-12-24 NOTE — Progress Notes (Signed)
Physical Therapy Treatment Patient Details Name: Colleen Zuniga MRN: [id number] DOB: 06-13-1960 Today's Date: 12/24/2021   History of Present Illness Pt is a 61 y.o. femlae s/p Lt TKA on 12/23/21.  PMh significant for anemia, anxiety, depression, DM, gerd, hypothyroidism, lumbar fusion (4098,1191), and  anterior cervical discectomy (2017).    PT Comments    Pt continues to participate well. Pain has been controlled. Pt feels ready to d/c home on today. Encouraged her to try to mobilize often at home. All PT education completed.    Recommendations for follow up therapy are one component of a multi-disciplinary discharge planning process, led by the attending physician.  Recommendations may be updated based on patient status, additional functional criteria and insurance authorization.  Follow Up Recommendations  Follow physician's recommendations for discharge plan and follow up therapies     Assistance Recommended at Discharge Intermittent Supervision/Assistance  Patient can return home with the following A little help with walking and/or transfers;A little help with bathing/dressing/bathroom;Assistance with cooking/housework;Help with stairs or ramp for entrance;Assist for transportation   Equipment Recommendations  None recommended by PT    Recommendations for Other Services       Precautions / Restrictions Precautions Precautions: Fall Restrictions Weight Bearing Restrictions: No LLE Weight Bearing: Weight bearing as tolerated     Mobility  Bed Mobility Overal bed mobility: Modified Independent Bed Mobility: Supine to Sit, Sit to Supine     Supine to sit: Modified independent (Device/Increase time) Sit to supine: Modified independent (Device/Increase time)   General bed mobility comments: No assist required.    Transfers Overall transfer level: Needs assistance Equipment used: Rolling walker (2 wheels) Transfers: Sit to/from Stand Sit to Stand: Min guard            General transfer comment: Min guard for safety. Cues for safety, hand/LE placement    Ambulation/Gait Ambulation/Gait assistance: Min guard Gait Distance (Feet): 115 Feet Assistive device: Rolling walker (2 wheels) Gait Pattern/deviations: Step-to pattern       General Gait Details: Min guard for safety. Slow but steady gait. No nausea or dizziness. Pt tolerated distance well.   Stairs             Wheelchair Mobility    Modified Rankin (Stroke Patients Only)       Balance Overall balance assessment: Needs assistance         Standing balance support: Bilateral upper extremity supported, During functional activity Standing balance-Leahy Scale: Fair                              Cognition Arousal/Alertness: Awake/alert Behavior During Therapy: WFL for tasks assessed/performed Overall Cognitive Status: Within Functional Limits for tasks assessed                                          Exercises     General Comments        Pertinent Vitals/Pain Pain Assessment Pain Assessment: Faces Faces Pain Scale: Hurts little more Pain Location: Lt knee Pain Descriptors / Indicators: Discomfort, Sore, Spasm Pain Intervention(s): Monitored during session, Repositioned, Ice applied    Home Living                          Prior Function  PT Goals (current goals can now be found in the care plan section) Progress towards PT goals: Progressing toward goals    Frequency    7X/week      PT Plan Current plan remains appropriate    Co-evaluation              AM-PAC PT "6 Clicks" Mobility   Outcome Measure  Help needed turning from your back to your side while in a flat bed without using bedrails?: None Help needed moving from lying on your back to sitting on the side of a flat bed without using bedrails?: None Help needed moving to and from a bed to a chair (including a wheelchair)?: A  Little Help needed standing up from a chair using your arms (e.g., wheelchair or bedside chair)?: A Little Help needed to walk in hospital room?: A Little Help needed climbing 3-5 steps with a railing? : A Little 6 Click Score: 20    End of Session Equipment Utilized During Treatment: Gait belt Activity Tolerance: Patient tolerated treatment well Patient left: in bed;with call bell/phone within reach;with family/visitor present;with bed alarm set   PT Visit Diagnosis: Unsteadiness on feet (R26.81);Muscle weakness (generalized) (M62.81);Pain Pain - Right/Left: Left Pain - part of body: Knee     Time: [id number] PT Time Calculation (min) (ACUTE ONLY): 17 min  Charges:  $Gait Training: 8-22 mins                         Doreatha Massed, PT Acute Rehabilitation  Office: [phone] Pager: [phone]

## 2021-12-24 NOTE — Plan of Care (Signed)

## 2021-12-24 NOTE — TOC Transition Note (Signed)
Transition of Care [facility]) - CM/SW Discharge Note   Patient Details  Name: Matasha Smigelski MRN: [id number] Date of Birth: August 27, 1960  [facility] CM/SW Contact:  Lennart Pall, LCSW Phone Number: 12/24/2021, 9:41 AM   Clinical Narrative:     Met with pt and confirming she has needed DME at home.  OPPT already arranged with [facility].  No TOC needs.  Final next level of care: OP Rehab Barriers to Discharge: No Barriers Identified   Patient Goals and CMS Choice Patient states their goals for this hospitalization and ongoing recovery are:: return home      Discharge Placement                       Discharge Plan and Services                DME Arranged: N/A DME Agency: NA       HH Arranged: NA HH Agency: NA        Social Determinants of Health (SDOH) Interventions     Readmission Risk Interventions     No data to display

## 2021-12-24 NOTE — Progress Notes (Signed)
Subjective: 1 Day Post-Op Procedure(s) (LRB): TOTAL KNEE ARTHROPLASTY (Left) Patient seen in rounds by Dr. Lequita Halt. Patient is well, and has had no acute complaints or problems. Denies SOB or chest pain. Denies calf pain. Voiding without difficulty. Patient reports pain as mild.  We will continue physical therapy today.  Objective: Vital signs in last 24 hours: Temp:  [97.8 F (36.6 C)-98.4 F (36.9 C)] 97.8 F (36.6 C) (10/17 0604) Pulse Rate:  [63-84] 73 (10/17 0604) Resp:  [8-18] 18 (10/17 0604) BP: (117-150)/(56-78) 126/59 (10/17 0604) SpO2:  [96 %-100 %] 98 % (10/17 0604)  Intake/Output from previous day:  Intake/Output Summary (Last 24 hours) at 12/24/2021 0658 Last data filed at 12/24/2021 0600 Gross per 24 hour  Intake 3329.08 ml  Output 2225 ml  Net 1104.08 ml     Intake/Output this shift: Total I/O In: 1334.9 [P.O.:480; I.V.:599.9; IV Piggyback:255] Out: 2200 [Urine:2200]  Labs: Recent Labs    12/24/21 0329  HGB 10.2*   Recent Labs    12/24/21 0329  WBC 9.0  RBC 3.12*  HCT 31.1*  PLT 201   Recent Labs    12/24/21 0329  NA 135  K 5.4*  CL 105  CO2 24  BUN 16  CREATININE 0.76  GLUCOSE 209*  CALCIUM 9.2   No results for input(s): "LABPT", "INR" in the last 72 hours.  Exam: General - Patient is Alert and Oriented Extremity - Neurologically intact Neurovascular intact Sensation intact distally Dorsiflexion/Plantar flexion intact Dressing - dressing C/D/I Motor Function - intact, moving foot and toes well on exam.  Past Medical History:  Diagnosis Date   Anemia    Anxiety    Arthritis    Back pain    Back pain    De Quervain's tenosynovitis, right 01/2012   Degenerative disc disease, cervical    states neck is stiff, reduced range of motion   Dental crowns present    Depression    Diabetes mellitus    Insulin pump   GERD (gastroesophageal reflux disease)    Heart murmur    states has a functional murmur, and that she has  never had any problems   Hypothyroidism    Joint pain    Osteoarthritis    PONV (postoperative nausea and vomiting)     Assessment/Plan: 1 Day Post-Op Procedure(s) (LRB): TOTAL KNEE ARTHROPLASTY (Left) Principal Problem:   Osteoarthritis of left knee Active Problems:   Primary osteoarthritis of left knee  Estimated body mass index is 32.56 kg/m as calculated from the following:   Height as of this encounter: 5\' 2"  (1.575 m).   Weight as of this encounter: 80.7 kg. Advance diet Up with therapy D/C IV fluids  Patient's anticipated LOS is less than 2 midnights, meeting these requirements: - Younger than 53 - Lives within 1 hour of care - Has a competent adult at home to recover with post-op - NO history of  - Chronic pain requiring opiods  - Diabetes  - Coronary Artery Disease  - Heart failure  - Heart attack  - Stroke  - DVT/VTE  - Cardiac arrhythmia  - Respiratory Failure/COPD  - Renal failure  - Anemia  - Advanced Liver disease  DVT Prophylaxis - Aspirin Weight bearing as tolerated. Continue physical therapy.  Plan is to go Home after hospital stay. Expected discharge today pending progress with physical therapy and if meeting patient goals. Scheduled [facility]. Follow-up in clinic in 2 weeks.  The PDMP database was reviewed  today prior to any opioid medications being prescribed to this patient.  R. Jaynie Bream, PA-C Orthopedic Surgery [phone] 12/24/2021, 6:58 AM

## 2021-12-24 NOTE — Progress Notes (Signed)
Provided discharge education/instructions, all questions and concerns addressed. Pt not in acute distress, discharged home with belongings. 

## 2021-12-25 MED FILL — Dexamethasone Sodium Phosphate Inj 10 MG/ML: INTRAMUSCULAR | Qty: 1 | Status: AC

## 2021-12-25 NOTE — Discharge Summary (Signed)
Physician Discharge Summary   Patient ID: Colleen Zuniga MRN: [id number] DOB/AGE: 14-May-1960 61 y.o.  Admit date: 12/23/2021 Discharge date: 12/24/2021  Primary Diagnosis: Osteoarthritis, left knee   Admission Diagnoses:  Past Medical History:  Diagnosis Date   Anemia    Anxiety    Arthritis    Back pain    Back pain    De Quervain's tenosynovitis, right 01/2012   Degenerative disc disease, cervical    states neck is stiff, reduced range of motion   Dental crowns present    Depression    Diabetes mellitus    Insulin pump   GERD (gastroesophageal reflux disease)    Heart murmur    states has a functional murmur, and that she has never had any problems   Hypothyroidism    Joint pain    Osteoarthritis    PONV (postoperative nausea and vomiting)    Discharge Diagnoses:   Principal Problem:   Osteoarthritis of left knee Active Problems:   Primary osteoarthritis of left knee  Estimated body mass index is 32.56 kg/m as calculated from the following:   Height as of this encounter:  (1.575 m).   Weight as of this encounter: 80.7 kg.  Procedure:  Procedure(s) (LRB): TOTAL KNEE ARTHROPLASTY (Left)   Consults: None  HPI: Colleen Zuniga is a 61 y.o. year old female with end stage OA of her left knee with progressively worsening pain and dysfunction. She has constant pain, with activity and at rest and significant functional deficits with difficulties even with ADLs. She has had extensive non-op management including analgesics, injections of cortisone and viscosupplements, and home exercise program, but remains in significant pain with significant dysfunction. Radiographs show bone on bone arthritis medial and patellofemoral. She presents now for left Total Knee Arthroplasty.  Laboratory Data: Admission on 12/23/2021, Discharged on 12/24/2021  Component Date Value Ref Range Status   Glucose-Capillary 12/23/2021 150 (H)  70 - 99 mg/dL Final   Glucose reference  range applies only to samples taken after fasting for at least 8 hours.   Glucose-Capillary 12/23/2021 128 (H)  70 - 99 mg/dL Final   Glucose reference range applies only to samples taken after fasting for at least 8 hours.   WBC 12/24/2021 9.0  4.0 - 10.5 K/uL Final   RBC 12/24/2021 3.12 (L)  3.87 - 5.11 MIL/uL Final   Hemoglobin 12/24/2021 10.2 (L)  12.0 - 15.0 g/dL Final   HCT 78/29/5621 31.1 (L)  36.0 - 46.0 % Final   MCV 12/24/2021 99.7  80.0 - 100.0 fL Final   MCH 12/24/2021 32.7  26.0 - 34.0 pg Final   MCHC 12/24/2021 32.8  30.0 - 36.0 g/dL Final   RDW 30/86/5784 11.8  11.5 - 15.5 % Final   Platelets 12/24/2021 201  150 - 400 K/uL Final   nRBC 12/24/2021 0.0  0.0 - 0.2 % Final   Performed at [facility], [address] [place], [place] [address]   Sodium 12/24/2021 135  135 - 145 mmol/L Final   Potassium 12/24/2021 5.4 (H)  3.5 - 5.1 mmol/L Final   Chloride 12/24/2021 105  98 - 111 mmol/L Final   CO2 12/24/2021 24  22 - 32 mmol/L Final   Glucose, Bld 12/24/2021 209 (H)  70 - 99 mg/dL Final   Glucose reference range applies only to samples taken after fasting for at least 8 hours.   BUN 12/24/2021 16  8 - 23 mg/dL Final   Creatinine,  Ser 12/24/2021 0.76  0.44 - 1.00 mg/dL Final   Calcium 40/12/2723 9.2  8.9 - 10.3 mg/dL Final   GFR, Estimated 12/24/2021 >60  >60 mL/min Final   Comment: (NOTE) Calculated using the CKD-EPI Creatinine Equation (2021)    Anion gap 12/24/2021 6  5 - 15 Final   Performed at [facility], [address], [place], [place] [address]   Glucose-Capillary 12/23/2021 196 (H)  70 - 99 mg/dL Final   Glucose reference range applies only to samples taken after fasting for at least 8 hours.  Hospital Outpatient Visit on 12/10/2021  Component Date Value Ref Range Status   MRSA, PCR 12/10/2021 NEGATIVE  NEGATIVE Final   Staphylococcus aureus 12/10/2021 NEGATIVE  NEGATIVE Final   Comment: (NOTE) The Xpert SA Assay (FDA  approved for NASAL specimens in patients 47 years of age and older), is one component of a comprehensive surveillance program. It is not intended to diagnose infection nor to guide or monitor treatment. Performed at [facility], [address] [place], [place] [address]    Sodium 12/10/2021 139  135 - 145 mmol/L Final   Potassium 12/10/2021 4.5  3.5 - 5.1 mmol/L Final   Chloride 12/10/2021 104  98 - 111 mmol/L Final   CO2 12/10/2021 27  22 - 32 mmol/L Final   Glucose, Bld 12/10/2021 86  70 - 99 mg/dL Final   Glucose reference range applies only to samples taken after fasting for at least 8 hours.   BUN 12/10/2021 25 (H)  8 - 23 mg/dL Final   Creatinine, Ser 12/10/2021 1.03 (H)  0.44 - 1.00 mg/dL Final   Calcium 25/95/6387 9.9  8.9 - 10.3 mg/dL Final   GFR, Estimated 12/10/2021 >60  >60 mL/min Final   Comment: (NOTE) Calculated using the CKD-EPI Creatinine Equation (2021)    Anion gap 12/10/2021 8  5 - 15 Final   Performed at [facility], [address] [place], [place] [address]   WBC 12/10/2021 6.8  4.0 - 10.5 K/uL Final   RBC 12/10/2021 3.71 (L)  3.87 - 5.11 MIL/uL Final   Hemoglobin 12/10/2021 11.9 (L)  12.0 - 15.0 g/dL Final   HCT 29/51/8841 36.6  36.0 - 46.0 % Final   MCV 12/10/2021 98.7  80.0 - 100.0 fL Final   MCH 12/10/2021 32.1  26.0 - 34.0 pg Final   MCHC 12/10/2021 32.5  30.0 - 36.0 g/dL Final   RDW 66/08/3014 11.7  11.5 - 15.5 % Final   Platelets 12/10/2021 240  150 - 400 K/uL Final   nRBC 12/10/2021 0.0  0.0 - 0.2 % Final   Performed at [facility], [address] [place], [place] [address]   Glucose-Capillary 12/10/2021 79  70 - 99 mg/dL Final   Glucose reference range applies only to samples taken after fasting for at least 8 hours.     X-Rays:No results found.  EKG: Orders placed or performed during the hospital encounter of 12/10/21   EKG 12 lead per protocol   EKG 12 lead per protocol      Hospital Course: Colleen Zuniga is a 61 y.o. who was admitted to [facility]. They were brought to the operating room on 12/23/2021 and underwent Procedure(s): TOTAL KNEE ARTHROPLASTY.  Patient tolerated the procedure well and was later transferred to the recovery room and then to the orthopaedic floor for postoperative care. They were given PO and IV analgesics for pain control following  their surgery. They were given 24 hours of postoperative antibiotics of  Anti-infectives (From admission, onward)    Start     Dose/Rate Route Frequency Ordered Stop   12/23/21 2000  vancomycin (VANCOCIN) IVPB 1000 mg/200 mL premix        1,000 mg 200 mL/hr over 60 Minutes Intravenous Every 12 hours 12/23/21 1111 12/24/21 1132   12/23/21 0615  vancomycin (VANCOCIN) IVPB 1000 mg/200 mL premix        1,000 mg 200 mL/hr over 60 Minutes Intravenous On call to O.R. 12/23/21 0603 12/23/21 0905   12/23/21 [id number]  vancomycin (VANCOCIN) 1-5 GM/200ML-% IVPB       Note to Pharmacy: Vevelyn RoyalsHarvell, Gwendolyn D: cabinet override      12/23/21 0614 12/23/21 0821      and started on DVT prophylaxis in the form of Aspirin.   PT and OT were ordered for total joint protocol. Discharge planning consulted to help with postop disposition and equipment needs.  Patient had a fair night on the evening of surgery. They started to get up OOB with therapy on POD #0. Pt was seen during rounds and was ready to go home pending progress with therapy. She worked with therapy on POD #1 and was meeting her goals. Pt was discharged to home later that day in stable condition.  Diet: Diabetic diet Activity: WBAT Follow-up: in 2 weeks Disposition: Home Discharged Condition: stable   Discharge Instructions     Call MD / Call 911   Complete by: As directed    If you experience chest pain or shortness of breath, CALL 911 and be transported to the hospital emergency room.  If you develope a fever above 101 F, pus (white drainage) or  increased drainage or redness at the wound, or calf pain, call your surgeon's office.   Change dressing   Complete by: As directed    You may remove the bulky bandage (ACE wrap and gauze) two days after surgery. You will have an adhesive waterproof bandage underneath. Leave this in place until your first follow-up appointment.   Constipation Prevention   Complete by: As directed    Drink plenty of fluids.  Prune juice may be helpful.  You may use a stool softener, such as Colace (over the counter) 100 mg twice a day.  Use MiraLax (over the counter) for constipation as needed.   Diet - low sodium heart healthy   Complete by: As directed    Do not put a pillow under the knee. Place it under the heel.   Complete by: As directed    Driving restrictions   Complete by: As directed    No driving for two weeks   Post-operative opioid taper instructions:   Complete by: As directed    POST-OPERATIVE OPIOID TAPER INSTRUCTIONS: It is important to wean off of your opioid medication as soon as possible. If you do not need pain medication after your surgery it is ok to stop day one. Opioids include: Codeine, Hydrocodone(Norco, Vicodin), Oxycodone(Percocet, oxycontin) and hydromorphone amongst others.  Long term and even short term use of opiods can cause: Increased pain response Dependence Constipation Depression Respiratory depression And more.  Withdrawal symptoms can include Flu like symptoms Nausea, vomiting And more Techniques to manage these symptoms Hydrate well Eat regular healthy meals Stay active Use relaxation techniques(deep breathing, meditating, yoga) Do Not substitute Alcohol to help with tapering If you have been on opioids for less than two weeks and do not have pain  than it is ok to stop all together.  Plan to wean off of opioids This plan should start within one week post op of your joint replacement. Maintain the same interval or time between taking each dose and first  decrease the dose.  Cut the total daily intake of opioids by one tablet each day Next start to increase the time between doses. The last dose that should be eliminated is the evening dose.      TED hose   Complete by: As directed    Use stockings (TED hose) for three weeks on both leg(s).  You may remove them at night for sleeping.   Weight bearing as tolerated   Complete by: As directed       Allergies as of 12/24/2021       Reactions   Penicillins Anaphylaxis, Other (See Comments)   CLOSES THROAT PATIENT HAD A PCN REACTION WITH IMMEDIATE RASH, FACIAL/TONGUE/THROAT SWELLING, SOB, OR LIGHTHEADEDNESS WITH HYPOTENSION:  #  #  #  YES  #  #  #   Has patient had a PCN reaction causing severe rash involving mucus membranes or skin necrosis: UNKNOWN  Has patient had a PCN reaction that required hospitalization:No Has patient had a PCN reaction occurring within the last 10 years:No   Hydrocodone-acetaminophen Nausea And Vomiting   Nausea/vomiting with or without food   Adhesive [tape] Itching, Rash        Medication List     STOP taking these medications    diclofenac Sodium 1 % Gel Commonly known as: VOLTAREN   etodolac 500 MG tablet Commonly known as: LODINE   oxyCODONE-acetaminophen 5-325 MG tablet Commonly known as: PERCOCET/ROXICET       TAKE these medications    acetaminophen 325 MG tablet Commonly known as: TYLENOL Take 650 mg by mouth every 6 (six) hours as needed for moderate pain. Notes to patient: Last dose given 10/17 09:56am   ADULT GUMMY PO Take 2 capsules by mouth daily. Notes to patient: Resume home regimen   AIRBORNE PO Take 2 capsules by mouth daily. Notes to patient: Resume home regimen   albuterol 108 (90 Base) MCG/ACT inhaler Commonly known as: VENTOLIN HFA Inhale 2 puffs into the lungs every 4 (four) hours as needed. Notes to patient: Resume home regimen   ALPRAZolam 0.5 MG tablet Commonly known as: XANAX Take 0.5 mg by mouth every 6  (six) hours as needed for sleep or anxiety. Notes to patient: Resume home regimen   aspirin EC 325 MG tablet Take 1 tablet (325 mg total) by mouth 2 (two) times daily for 20 days. Then take one 81 mg aspirin once a day for three weeks. Then discontinue aspirin.   atorvastatin 10 MG tablet Commonly known as: LIPITOR Take 10 mg by mouth daily after supper. Notes to patient: Resume home regimen   Baqsimi Two Pack 3 MG/DOSE Powd Generic drug: Glucagon Place 3 mg into the nose as needed (hypoglycemia). Notes to patient: Resume home regimen   buPROPion 300 MG 24 hr tablet Commonly known as: WELLBUTRIN XL Take 300 mg by mouth daily before breakfast.   CALCIUM 500 + D PO Take 2 tablets by mouth 2 (two) times daily. Notes to patient: Resume home regimen   Continuous Glucose Monitor Devi 1 Device by Other route continuous. Notes to patient: Resume home regimen   docusate sodium 100 MG capsule Commonly known as: COLACE Take 100 mg by mouth every evening.   ferrous sulfate 325 (65 FE) MG  tablet Take 325 mg by mouth at bedtime. Notes to patient: Resume home regimen   fluticasone 50 MCG/ACT nasal spray Commonly known as: FLONASE Place 2 sprays into both nostrils daily as needed for allergies. Notes to patient: Resume home regimen   Fortify Daily Probiotic Caps Take 1 capsule by mouth daily. Notes to patient: Resume home regimen   gabapentin 300 MG capsule Commonly known as: NEURONTIN Take 300 mg by mouth 4 (four) times daily.   insulin aspart 100 UNIT/ML injection Commonly known as: novoLOG Continuous with insulin pump Notes to patient: Resume home regimen   insulin pump Soln Inject into the skin continuous. Novolog Notes to patient: Resume home regimen   irbesartan 300 MG tablet Commonly known as: AVAPRO Take 300 mg by mouth daily after breakfast.   levocetirizine 5 MG tablet Commonly known as: XYZAL Take 5 mg by mouth daily. Notes to patient: Resume home regimen    levothyroxine 88 MCG tablet Commonly known as: SYNTHROID Take 88 mcg by mouth daily before breakfast.   Magnesium 250 MG Tabs Take 500 mg by mouth at bedtime. Notes to patient: Resume home regimen   methocarbamol 500 MG tablet Commonly known as: ROBAXIN Take 1 tablet (500 mg total) by mouth every 6 (six) hours as needed for muscle spasms. Notes to patient: Last dose given 10/17 01:43pm   metoCLOPramide 5 MG tablet Commonly known as: REGLAN Take 5 mg by mouth every 6 (six) hours as needed for nausea. Notes to patient: Last dose given 10/16 03:44pm   Mounjaro 5 MG/0.5ML Pen Generic drug: tirzepatide Inject 5 mg into the skin every Wednesday.   ondansetron 8 MG disintegrating tablet Commonly known as: ZOFRAN-ODT Take 1 tablet (8 mg total) by mouth every 8 (eight) hours as needed for nausea or vomiting. Notes to patient: Last dos egiven 10/16 07:36pm   oxyCODONE 5 MG immediate release tablet Commonly known as: Oxy IR/ROXICODONE Take 1-2 tablets (5-10 mg total) by mouth every 6 (six) hours as needed for severe pain. Notes to patient: Last dose given 10/17 01:43pm   potassium gluconate 595 (99 K) MG Tabs tablet Take 595 mg by mouth daily after breakfast. Notes to patient: Resume home regimen   REFRESH OP Place 1 drop into both eyes 2 (two) times daily as needed (dry eyes). Notes to patient: Resume home regimen   traMADol 50 MG tablet Commonly known as: ULTRAM Take 1-2 tablets (50-100 mg total) by mouth every 6 (six) hours as needed for moderate pain. Notes to patient: Last dose given 03:40pm   vitamin C 250 MG tablet Commonly known as: ASCORBIC ACID Take 500 mg by mouth 2 (two) times daily. Notes to patient: Resume home regimen               Discharge Care Instructions  (From admission, onward)           Start     Ordered   12/24/21 0000  Weight bearing as tolerated        12/24/21 0701   12/24/21 0000  Change dressing       Comments: You may remove  the bulky bandage (ACE wrap and gauze) two days after surgery. You will have an adhesive waterproof bandage underneath. Leave this in place until your first follow-up appointment.   12/24/21 0701            Follow-up Information     Ollen Gross, MD. Go on 01/07/2022.   Specialty: Orthopedic Surgery Why: You are scheduled for  a follow up appointment on 01-07-22 at 2:00 pm, Contact information: [address] [place] [address] [phone]                 Signed: R. Arcola Jansky, PA-C Orthopedic Surgery 12/25/2021, 8:15 AM

## 2021-12-30 NOTE — Progress Notes (Signed)
Chief Complaint:   OBESITY Colleen Zuniga is here to discuss her progress with her obesity treatment plan along with follow-up of her obesity related diagnoses. Colleen Zuniga is on the Category 1 Plan and states she is following her eating plan approximately 75% of the time. Colleen Zuniga states she is PT exercises 15 minutes 7 times per week.  Today's visit was #: 5 Starting weight: 190 lbs Starting date: 09/17/2021 Today's weight: 178 lbs Today's date: 12/19/2021 Total lbs lost to date: 12 lbs Total lbs lost since last in-office visit: 6 lbs  Interim History: She fell on Monday and was in the hospital, she is back at home so had to eat off plan some.  She did skip some meals.  She is scheduled for left total knee replacement on 12/23/2021.   Subjective:   1. Osteoarthritis of left knee, unspecified osteoarthritis type Scheduled for left total knee replacement with Dr Maureen Ralphs, 12/23/2021. Plans to do PT after surgery.  Has fair support at home.   2. Type 1 diabetes mellitus with other specified complication (HCC) Using insulin pump, less hypoglycemia.  Trying to not meal skip.  She is doing well on Mounjaro 5 mg weekly which helps, prescribed by endocrinology.  3. Other specified hypothyroidism TSH rechecked 1.07, 12/11/2021, on Levothyroxine 88 mcg daily. Feels better, no more night sweets.   Assessment/Plan:   1. Osteoarthritis of left knee, unspecified osteoarthritis type Look for improvements in exercise capacity following bilateral total knee replacements.   2. Type 1 diabetes mellitus with other specified complication ([place]) Watching for hypoglycemia on insulin and Mounjaro.  Holding Mounjaro in preparation for total knee replacement.   3. Other specified hypothyroidism Continue levothyroxine 88 mcg daily.   4. Obesity,current BMI 32.7 Colleen Zuniga is currently in the action stage of change. As such, her goal is to continue with weight loss efforts. She has agreed to the Category 1 Plan + 80  protein daily.   Exercise goals:  As is.   Behavioral modification strategies: increasing lean protein intake, increasing vegetables, increasing water intake, decreasing eating out, no skipping meals, meal planning and cooking strategies, keeping healthy foods in the home, better snacking choices, and decreasing junk food.  Colleen Zuniga has agreed to follow-up with our clinic in 4 weeks. She was informed of the importance of frequent follow-up visits to maximize her success with intensive lifestyle modifications for her multiple health conditions.   Objective:   Blood pressure 119/73, pulse 74, temperature 97.8 F (36.6 C), height 5\' 2"  (1.575 m), weight 178 lb (80.7 kg), SpO2 99 %. Body mass index is 32.56 kg/m.  General: Cooperative, alert, well developed, in no acute distress. HEENT: Conjunctivae and lids unremarkable. Cardiovascular: Regular rhythm.  Lungs: Normal work of breathing. Neurologic: No focal deficits.   Lab Results  Component Value Date   CREATININE 0.76 12/24/2021   BUN 16 12/24/2021   NA 135 12/24/2021   K 5.4 (H) 12/24/2021   CL 105 12/24/2021   CO2 24 12/24/2021   Lab Results  Component Value Date   ALT 27 09/17/2021   AST 33 09/17/2021   ALKPHOS 129 (H) 09/17/2021   BILITOT 0.5 09/17/2021   Lab Results  Component Value Date   HGBA1C 6.3 (H) 09/17/2021   HGBA1C 6.8 (H) 07/28/2019   HGBA1C 6.4 (H) 07/28/2018   HGBA1C 6.2 (H) 04/29/2018   HGBA1C 6.1 (H) 08/11/2016   Lab Results  Component Value Date   INSULIN <0.4 (L) 09/17/2021   Lab Results  Component  Value Date   TSH 0.049 (L) 09/17/2021   Lab Results  Component Value Date   CHOL 156 04/29/2018   HDL 74 04/29/2018   LDLCALC 67 04/29/2018   TRIG 76 04/29/2018   Lab Results  Component Value Date   VD25OH 70.8 09/17/2021   VD25OH 41.3 04/29/2018   Lab Results  Component Value Date   WBC 9.0 12/24/2021   HGB 10.2 (L) 12/24/2021   HCT 31.1 (L) 12/24/2021   MCV 99.7 12/24/2021   PLT 201  12/24/2021   Lab Results  Component Value Date   IRON 37 01/18/2019   TIBC 233 (L) 01/18/2019   FERRITIN 21 01/18/2019    Attestation Statements:   Reviewed by clinician on day of visit: allergies, medications, problem list, medical history, surgical history, family history, social history, and previous encounter notes.  Time spent on visit including pre-visit chart review and post-visit care and charting was 30 minutes.   I, Malcolm Metro, RMA, am acting as Energy manager for Seymour Bars, DO.  I have reviewed the above documentation for accuracy and completeness, and I agree with the above. Glennis Brink, DO

## 2022-01-22 ENCOUNTER — Ambulatory Visit (INDEPENDENT_AMBULATORY_CARE_PROVIDER_SITE_OTHER): Payer: Medicare Other | Admitting: Family Medicine

## 2022-01-22 ENCOUNTER — Encounter (INDEPENDENT_AMBULATORY_CARE_PROVIDER_SITE_OTHER): Payer: Self-pay | Admitting: Family Medicine

## 2022-01-22 VITALS — BP 107/63 | HR 82 | Temp 98.1°F | Ht 62.0 in | Wt 174.0 lb

## 2022-01-22 DIAGNOSIS — Z6831 Body mass index (BMI) 31.0-31.9, adult: Secondary | ICD-10-CM

## 2022-01-22 DIAGNOSIS — M170 Bilateral primary osteoarthritis of knee: Secondary | ICD-10-CM | POA: Diagnosis not present

## 2022-01-22 DIAGNOSIS — E669 Obesity, unspecified: Secondary | ICD-10-CM | POA: Diagnosis not present

## 2022-01-22 DIAGNOSIS — E1069 Type 1 diabetes mellitus with other specified complication: Secondary | ICD-10-CM

## 2022-01-22 DIAGNOSIS — Z7985 Long-term (current) use of injectable non-insulin antidiabetic drugs: Secondary | ICD-10-CM

## 2022-02-03 NOTE — Progress Notes (Unsigned)
Chief Complaint:   OBESITY Colleen Zuniga is here to discuss her progress with her obesity treatment plan along with follow-up of her obesity related diagnoses. Colleen Zuniga is on the Category 1 Plan+80 protein and states she is following her eating plan approximately 80% of the time. Colleen Zuniga states she is doing PT for knee 30 minutes 7 times per week.  Today's visit was #: 6 Starting weight: 190 lbs Starting date: 09/17/2021 Today's weight: 174 lbs Today's date: 01/22/2022 Total lbs lost to date: 16 lbs Total lbs lost since last in-office visit: 4 lbs  Interim History: Had left total knee replacement 10/16, without complications.  Doing PT 2 times per week and doing home exercises.  Plans to do right knee replacement in January.  Doing well with meal plan.  Occasional hypoglycemia but denies meal skipping.  New weight loss 16 lbs in 4 months.    Subjective:   1. Type 1 diabetes mellitus with other specified complication (HCC) She is on insulin pump.  Manage by Dr Felipa Eth.  And Mounjaro 5 mg weekly.  Has adequate satiety with slight nausea.  Average blood sugar 130.    2. Osteoarthritis of both knees, unspecified osteoarthritis type S/P TKR, she is in PT and awaiting right total knee replacement in January. Walking well without a limp.   Assessment/Plan:   1. Type 1 diabetes mellitus with other specified complication (HCC) Continue plan of care per PCP and avoid meal skipping.   2. Osteoarthritis of both knees, unspecified osteoarthritis type Continue PT and home exercises.   3. Obesity, current BMI 31.9 Colleen Zuniga is currently in the action stage of change. As such, her goal is to continue with weight loss efforts. She has agreed to the Category 1 Plan+80 protein daily.   Exercise goals:  As is.   Behavioral modification strategies: increasing lean protein intake, increasing vegetables, increasing water intake, increasing high fiber foods, decreasing eating out, no skipping meals, meal planning  and cooking strategies, and decreasing junk food.  Colleen Zuniga has agreed to follow-up with our clinic in 4 weeks. She was informed of the importance of frequent follow-up visits to maximize her success with intensive lifestyle modifications for her multiple health conditions.   Objective:   Blood pressure 107/63, pulse 82, temperature 98.1 F (36.7 C), height 5\' 2"  (1.575 m), weight 174 lb (78.9 kg), SpO2 97 %. Body mass index is 31.83 kg/m.  General: Cooperative, alert, well developed, in no acute distress. HEENT: Conjunctivae and lids unremarkable. Cardiovascular: Regular rhythm.  Lungs: Normal work of breathing. Neurologic: No focal deficits.   Lab Results  Component Value Date   CREATININE 0.76 12/24/2021   BUN 16 12/24/2021   NA 135 12/24/2021   K 5.4 (H) 12/24/2021   CL 105 12/24/2021   CO2 24 12/24/2021   Lab Results  Component Value Date   ALT 27 09/17/2021   AST 33 09/17/2021   ALKPHOS 129 (H) 09/17/2021   BILITOT 0.5 09/17/2021   Lab Results  Component Value Date   HGBA1C 6.3 (H) 09/17/2021   HGBA1C 6.8 (H) 07/28/2019   HGBA1C 6.4 (H) 07/28/2018   HGBA1C 6.2 (H) 04/29/2018   HGBA1C 6.1 (H) 08/11/2016   Lab Results  Component Value Date   INSULIN <0.4 (L) 09/17/2021   Lab Results  Component Value Date   TSH 0.049 (L) 09/17/2021   Lab Results  Component Value Date   CHOL 156 04/29/2018   HDL 74 04/29/2018   LDLCALC 67 04/29/2018  TRIG 76 04/29/2018   Lab Results  Component Value Date   VD25OH 70.8 09/17/2021   VD25OH 41.3 04/29/2018   Lab Results  Component Value Date   WBC 9.0 12/24/2021   HGB 10.2 (L) 12/24/2021   HCT 31.1 (L) 12/24/2021   MCV 99.7 12/24/2021   PLT 201 12/24/2021   Lab Results  Component Value Date   IRON 37 01/18/2019   TIBC 233 (L) 01/18/2019   FERRITIN 21 01/18/2019   Attestation Statements:   Reviewed by clinician on day of visit: allergies, medications, problem list, medical history, surgical history, family  history, social history, and previous encounter notes.  I, Malcolm Metro, am acting as Energy manager for Seymour Bars, DO.  I have reviewed the above documentation for accuracy and completeness, and I agree with the above. -  ***

## 2022-02-05 ENCOUNTER — Other Ambulatory Visit (HOSPITAL_COMMUNITY): Payer: Medicare Other

## 2022-02-19 ENCOUNTER — Ambulatory Visit (INDEPENDENT_AMBULATORY_CARE_PROVIDER_SITE_OTHER): Payer: Medicare Other | Admitting: Family Medicine

## 2022-02-19 ENCOUNTER — Encounter (INDEPENDENT_AMBULATORY_CARE_PROVIDER_SITE_OTHER): Payer: Self-pay | Admitting: Family Medicine

## 2022-02-19 VITALS — BP 125/80 | HR 75 | Temp 97.7°F | Ht 62.0 in | Wt 167.0 lb

## 2022-02-19 DIAGNOSIS — E669 Obesity, unspecified: Secondary | ICD-10-CM | POA: Diagnosis not present

## 2022-02-19 DIAGNOSIS — Z7985 Long-term (current) use of injectable non-insulin antidiabetic drugs: Secondary | ICD-10-CM

## 2022-02-19 DIAGNOSIS — Z6830 Body mass index (BMI) 30.0-30.9, adult: Secondary | ICD-10-CM

## 2022-02-19 DIAGNOSIS — E038 Other specified hypothyroidism: Secondary | ICD-10-CM

## 2022-02-19 DIAGNOSIS — E1069 Type 1 diabetes mellitus with other specified complication: Secondary | ICD-10-CM

## 2022-02-19 DIAGNOSIS — M170 Bilateral primary osteoarthritis of knee: Secondary | ICD-10-CM | POA: Diagnosis not present

## 2022-02-19 DIAGNOSIS — Z794 Long term (current) use of insulin: Secondary | ICD-10-CM

## 2022-02-25 NOTE — Progress Notes (Signed)
Chief Complaint:   OBESITY Colleen Zuniga is here to discuss her progress with her obesity treatment plan along with follow-up of her obesity related diagnoses. Colleen Zuniga is on the Category 1 Plan +80 protein and states she is following her eating plan approximately 95% of the time. Colleen Zuniga states she is walking more steps and PT 7 times per week.  Today's visit was #: 7 Starting weight: 190 LBS Starting date: 01/18/2022 Today's weight: 167 LBS Today's date: 02/19/2022 Total lbs lost to date: 23 LBS Total lbs lost since last in-office visit: 7 LBS  Interim History: Patient has completed PT for left total knee replacement and will have a right total knee replacement in late January.  Patient feels good about weight loss, has been practicing mindful eating.  She avoids sweets.  Her appetite has reduced on Mounjaro.  Emotional eating 1 episode after her husband's dementia set her off.   Subjective:   1. Type 1 diabetes mellitus with other specified complication (HCC) Managed by Dr. Felipa Eth.  Patient is on insulin pump and Mounjaro 5 mg weekly injection.  Patient has slight nausea which is improving.  Patient enjoys improved satiety.  Patient has occasional lows but eats on a schedule with protein and fiber with meals and snacks  2. Other specified hypothyroidism On 02/04/2022 TSH was 7.8.  Reviewed from [facility], this may have affected her weight loss.  Levothyroxine was increased to 100 mcg daily.  3. Osteoarthritis of both knees, unspecified osteoarthritis type Pain in left knee has much improved.  Patient is doing home PT and preparing for a right total knee replacement on 03/31/2022.  Patient is able to do some light exercise.  Weight loss is helping her overall mobility.  Assessment/Plan:   1. Type 1 diabetes mellitus with other specified complication (HCC) Continue current treatment plan per Dr. Felipa Eth.  Insulin resistance is improving with weight loss.  2. Other specified hypothyroidism TSH recheck  scheduled with PCP.  23 LBS weight loss in 5 months may be responsible for shifting thyroid results.  3. Osteoarthritis of both knees, unspecified osteoarthritis type Use Tylenol 325 mg 2 tablets every 6 hours as needed for pain.  4. Obesity,current BMI 30.6 1.  Practice mindful eating. 2.  Generalized dementia care support group.  Colleen Zuniga is currently in the action stage of change. As such, her goal is to continue with weight loss efforts. She has agreed to the Category 1 Plan +80 protein daily.   Exercise goals:  As is.  Behavioral modification strategies: increasing lean protein intake, increasing vegetables, increasing water intake, no skipping meals, meal planning and cooking strategies, keeping healthy foods in the home, emotional eating strategies, and holiday eating strategies .  Colleen Zuniga has agreed to follow-up with our clinic in 4 weeks. She was informed of the importance of frequent follow-up visits to maximize her success with intensive lifestyle modifications for her multiple health conditions.   Objective:   Blood pressure 125/80, pulse 75, temperature 97.7 F (36.5 C), height 5\' 2"  (1.575 m), weight 167 lb (75.8 kg), SpO2 99 %. Body mass index is 30.54 kg/m.  General: Cooperative, alert, well developed, in no acute distress. HEENT: Conjunctivae and lids unremarkable. Cardiovascular: Regular rhythm.  Lungs: Normal work of breathing. Neurologic: No focal deficits.   Lab Results  Component Value Date   CREATININE 0.76 12/24/2021   BUN 16 12/24/2021   NA 135 12/24/2021   K 5.4 (H) 12/24/2021   CL 105 12/24/2021   CO2 24  12/24/2021   Lab Results  Component Value Date   ALT 27 09/17/2021   AST 33 09/17/2021   ALKPHOS 129 (H) 09/17/2021   BILITOT 0.5 09/17/2021   Lab Results  Component Value Date   HGBA1C 6.3 (H) 09/17/2021   HGBA1C 6.8 (H) 07/28/2019   HGBA1C 6.4 (H) 07/28/2018   HGBA1C 6.2 (H) 04/29/2018   HGBA1C 6.1 (H) 08/11/2016   Lab Results  Component  Value Date   INSULIN <0.4 (L) 09/17/2021   Lab Results  Component Value Date   TSH 0.049 (L) 09/17/2021   Lab Results  Component Value Date   CHOL 156 04/29/2018   HDL 74 04/29/2018   LDLCALC 67 04/29/2018   TRIG 76 04/29/2018   Lab Results  Component Value Date   VD25OH 70.8 09/17/2021   VD25OH 41.3 04/29/2018   Lab Results  Component Value Date   WBC 9.0 12/24/2021   HGB 10.2 (L) 12/24/2021   HCT 31.1 (L) 12/24/2021   MCV 99.7 12/24/2021   PLT 201 12/24/2021   Lab Results  Component Value Date   IRON 37 01/18/2019   TIBC 233 (L) 01/18/2019   FERRITIN 21 01/18/2019   Attestation Statements:   Reviewed by clinician on day of visit: allergies, medications, problem list, medical history, surgical history, family history, social history, and previous encounter notes.  I, Malcolm Metro, am acting as Energy manager for Colleen Bars, DO.  I have reviewed the above documentation for accuracy and completeness, and I agree with the above. Glennis Brink, DO

## 2022-03-11 ENCOUNTER — Other Ambulatory Visit (HOSPITAL_COMMUNITY): Payer: Self-pay

## 2022-03-11 NOTE — H&P (Signed)
TOTAL KNEE ADMISSION H&P  Patient is being admitted for right total knee arthroplasty.  Subjective:  Chief Complaint: Right knee pain.  HPI: Colleen Zuniga, 62 y.o. female has a history of pain and functional disability in the right knee due to arthritis and has failed non-surgical conservative treatments for greater than 12 weeks to include NSAID's and/or analgesics, corticosteriod injections, viscosupplementation injections, and activity modification. Onset of symptoms was gradual, starting several years ago with gradually worsening course since that time. The patient noted prior procedures on the knee to include  arthroscopy and menisectomy on the right knee.  Patient currently rates pain in the right knee at 8 out of 10 with activity. Patient has night pain, worsening of pain with activity and weight bearing, and pain that interferes with activities of daily living. Patient has evidence of  bone-on-bone arthritis in the medial and patellofemoral compartments  by imaging studies.There is no active infection.  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   Lumbar stenosis 08/18/2016   Cervical spondylosis with myelopathy and radiculopathy 01/15/2016   Acquired scoliosis 01/15/2016   Arthritis    Hypokalemia    Chest pain     Past Medical History:  Diagnosis Date   Anemia    Anxiety    Arthritis    Back pain    Back pain    De Quervain's tenosynovitis, right 01/2012   Degenerative disc disease, cervical    states neck is stiff, reduced range of  motion   Dental crowns present    Depression    Diabetes mellitus    Insulin pump   GERD (gastroesophageal reflux disease)    Heart murmur    states has a functional murmur, and that she has never had any problems   Hypothyroidism    Joint pain    Osteoarthritis    PONV (postoperative nausea and vomiting)     Past Surgical History:  Procedure Laterality Date   ANTERIOR CERVICAL DISCECTOMY  02/20/2016   ANTERIOR LAT LUMBAR FUSION N/A 08/18/2016   Procedure: ANTERIOR LATERAL LUMBAR FUSION LUMBAR TWO- LUMBAR THREE, LUMBAR THREE- LUMBAR FOUR, LUMBAR FOUR- LUMBAR FIVE; LUMBAR TWO- LUMBAR FIVE PEDICLE SCREW FIXATION;  Surgeon: Shirlean Kelly, MD;  Location: [facility] OR;  Service: Neurosurgery;  Laterality: N/A;  ANTERIOR LATERAL LUMBAR FUSION LUMBAR 2- LUMBAR 3, LUMBAR 3- LUMBAR 4-, LUMBAR 4- LUMBAR 5; LUMBAR 2- LUMBAR 5 PEDICLE SCREW FIXATION   ANTERIOR LAT LUMBAR FUSION N/A 08/05/2019   Procedure: ANTERIOR LATERAL LUMBAR INTERBODY FUSION LUMBAR ONE-TWO.;  Surgeon: Donalee Citrin, MD;  Location: [facility] OR;  Service: Neurosurgery;  Laterality: N/A;  anterolateral   APPLICATION OF INTRAOPERATIVE CT SCAN N/A 08/05/2019   Procedure: APPLICATION OF INTRAOPERATIVE CT SCAN;  Surgeon: Donalee Citrin, MD;  Location: [facility] OR;  Service: Neurosurgery;  Laterality: N/A;   BACK SURGERY  07/2018   plif l5-s1   Cataracts Bilateral    CESAREAN SECTION     DORSAL COMPARTMENT RELEASE  02/08/2002   first dorsal compartment left wrist   DORSAL COMPARTMENT RELEASE  02/03/2012   Procedure: RELEASE DORSAL COMPARTMENT (DEQUERVAIN);  Surgeon:  Nicki Reaper, MD;  Location:  SURGERY CENTER;  Service: Orthopedics;  Laterality: Right;  RELEASE DEQUERVAINS RIGHT WRIST   KNEE ARTHROSCOPY  10/14/2004   right   LAMINECTOMY WITH POSTERIOR LATERAL ARTHRODESIS LEVEL 1 N/A 08/05/2019   Procedure: Posterior lateral fusion - Lumbar Five-Sacral One with pelvic fixation with iliac screws and Nuvasive instrumentation;  Surgeon: Donalee Citrin, MD;   Location: [facility] OR;  Service: Neurosurgery;  Laterality: N/A;  posterior   SHOULDER ARTHROSCOPY  05/05/2005   left   TOTAL KNEE ARTHROPLASTY Left 12/23/2021   Procedure: TOTAL KNEE ARTHROPLASTY;  Surgeon: Ollen Gross, MD;  Location: [facility] ORS;  Service: Orthopedics;  Laterality: Left;   TRIGGER FINGER RELEASE  01/07/2011   release A1 pulley left thumb   TRIGGER FINGER RELEASE     x 5 other fingers    Prior to Admission medications   Medication Sig Start Date End Date Taking? Authorizing Provider  acetaminophen (TYLENOL) 325 MG tablet Take 650 mg by mouth every 6 (six) hours as needed for moderate pain.    [provider]  albuterol (VENTOLIN HFA) 108 (90 Base) MCG/ACT inhaler Inhale 2 puffs into the lungs every 4 (four) hours as needed. 04/08/19   [provider]  ALPRAZolam Prudy Feeler) 0.5 MG tablet Take 0.5 mg by mouth every 6 (six) hours as needed for sleep or anxiety. 05/25/16   [provider]  atorvastatin (LIPITOR) 10 MG tablet Take 10 mg by mouth daily after supper.    [provider]  BAQSIMI TWO PACK 3 MG/DOSE POWD Place 3 mg into the nose as needed (hypoglycemia). 06/08/19   [provider]  buPROPion (WELLBUTRIN XL) 300 MG 24 hr tablet Take 300 mg by mouth daily before breakfast.    [provider]  Calcium Carb-Cholecalciferol (CALCIUM 500 + D PO) Take 2 tablets by mouth 2 (two) times daily.    [provider]  Continuous Glucose Monitor DEVI 1 Device by Other route continuous.    [provider]  docusate sodium (COLACE) 100 MG capsule Take 100 mg by mouth every evening.    [provider]  ferrous sulfate 325 (65 FE) MG tablet Take 325 mg by mouth at bedtime.    [provider]  fluticasone (FLONASE) 50 MCG/ACT nasal spray Place 2 sprays into both nostrils daily as needed for allergies.    [provider]  gabapentin (NEURONTIN) 300 MG capsule Take 300 mg by mouth 4 (four) times daily.      [provider]  insulin aspart (NOVOLOG) 100 UNIT/ML injection Continuous with insulin pump    [provider]  Insulin Human (INSULIN PUMP) SOLN Inject into the skin continuous. Novolog    [provider]  irbesartan (AVAPRO) 300 MG tablet Take 300 mg by mouth daily after breakfast.    [provider]  levocetirizine (XYZAL) 5 MG tablet Take 5 mg by mouth daily.    [provider]  levothyroxine (SYNTHROID) 88 MCG tablet Take 88 mcg by mouth daily before breakfast.    [provider]  Magnesium 250 MG TABS Take 500 mg by mouth at bedtime.    [provider]  methocarbamol (ROBAXIN) 500 MG tablet Take 1 tablet (500 mg total) by mouth every 6 (six) hours as needed for muscle spasms. 12/24/21   Eartha Inch, PA  metoCLOPramide (REGLAN) 5 MG tablet Take 5 mg by mouth every 6 (six) hours as needed for nausea.    [provider]  Multiple  Vitamins-Minerals (ADULT GUMMY PO) Take 2 capsules by mouth daily.    [provider]  Multiple Vitamins-Minerals (AIRBORNE PO) Take 2 capsules by mouth daily.    [provider]  ondansetron (ZOFRAN-ODT) 8 MG disintegrating tablet Take 1 tablet (8 mg total) by mouth every 8 (eight) hours as needed for nausea or vomiting. 06/16/21   Lacretia Leigh, MD  oxyCODONE (OXY IR/ROXICODONE) 5 MG immediate release tablet Take 1-2 tablets (5-10 mg total) by mouth every 6 (six) hours as needed for severe pain. 12/24/21   Jearld Lesch, PA  Polyvinyl Alcohol-Povidone (REFRESH OP) Place 1 drop into both eyes 2 (two) times daily as needed (dry eyes).    [provider]  potassium gluconate 595 (99 K) MG TABS tablet Take 595 mg by mouth daily after breakfast.    [provider]  Probiotic Product (FORTIFY DAILY PROBIOTIC) CAPS Take 1 capsule by mouth daily.    [provider]  tirzepatide Darcel Bayley) 5 MG/0.5ML Pen Inject 5 mg into the skin every Wednesday. 11/20/21    [provider]  traMADol (ULTRAM) 50 MG tablet Take 1-2 tablets (50-100 mg total) by mouth every 6 (six) hours as needed for moderate pain. 12/24/21   Jearld Lesch, PA  vitamin C (ASCORBIC ACID) 250 MG tablet Take 500 mg by mouth 2 (two) times daily.    [provider]    Allergies  Allergen Reactions   Penicillins Anaphylaxis and Other (See Comments)    CLOSES THROAT PATIENT HAD A PCN REACTION WITH IMMEDIATE RASH, FACIAL/TONGUE/THROAT SWELLING, SOB, OR LIGHTHEADEDNESS WITH HYPOTENSION:  #  #  #  YES  #  #  #   Has patient had a PCN reaction causing severe rash involving mucus membranes or skin necrosis: UNKNOWN  Has patient had a PCN reaction that required hospitalization:No Has patient had a PCN reaction occurring within the last 10 years:No    Hydrocodone-Acetaminophen Nausea And Vomiting    Nausea/vomiting with or without food   Adhesive [Tape] Itching and Rash    Social History   Socioeconomic History   Marital status: Married    Spouse name: Rayette Mogg   Number of children: Not on file   Years of education: Not on file   Highest education level: Not on file  Occupational History   Occupation: [employer]  Tobacco Use   Smoking status: Former    Types: Cigarettes    Quit date: 03/11/1991    Years since quitting: 31.0   Smokeless tobacco: Never  Vaping Use   Vaping Use: Never used  Substance and Sexual Activity   Alcohol use: Not Currently    Comment: rarely   Drug use: No   Sexual activity: Not on file  Other Topics Concern   Not on file  Social History Narrative   Not on file   Social Determinants of Health   Financial Resource Strain: Not on file  Food Insecurity: Not on file  Transportation Needs: Not on file  Physical Activity: Not on file  Stress: Not on file  Social Connections: Not on file  Intimate Partner Violence: Not on file    Tobacco Use: Medium Risk (02/19/2022)   Patient History    Smoking Tobacco Use: Former     Smokeless Tobacco Use: Never    Passive Exposure: Not on file   Social History   Substance and Sexual Activity  Alcohol Use Not Currently   Comment: rarely    Family History  Problem Relation Age of Onset  Heart disease Mother    Stroke Father    Diabetes Father    Heart disease Father    Kidney disease Father    Obesity Father    Colon cancer Neg Hx    Esophageal cancer Neg Hx    Rectal cancer Neg Hx    Stomach cancer Neg Hx     Review of Systems  Constitutional:  Negative for chills and fever.  HENT: Negative.    Eyes: Negative.   Respiratory:  Negative for cough and shortness of breath.   Cardiovascular:  Negative for chest pain and palpitations.  Gastrointestinal:  Negative for abdominal pain, constipation, diarrhea, nausea and vomiting.  Genitourinary:  Negative for dysuria, frequency and urgency.  Musculoskeletal:  Positive for joint pain.  Skin:  Negative for rash.   Objective:  Physical Exam: Well nourished and well developed.  General: Alert and oriented x3, cooperative and pleasant, no acute distress.  Head: normocephalic, atraumatic, neck supple.  Eyes: EOMI.  Abdomen: non-tender to palpation and soft, normoactive bowel sounds. Musculoskeletal:  Right Knee Exam: No effusion present. No swelling present. The range of motion is: 0 to 125 degrees. No crepitus on range of motion of the knee. Positive medial joint line tenderness. No lateral joint line tenderness. The knee is stable.  Calves soft and nontender. Motor function intact in LE. Strength 5/5 LE bilaterally. Neuro: Distal pulses 2+. Sensation to light touch intact in LE.  Vital signs in last 24 hours: BP: ()/()  Arterial Line BP: ()/()   Imaging Review Plain radiographs demonstrate moderate degenerative joint disease of the right knee. The overall alignment is neutral. The bone quality appears to be adequate for age and reported activity level.  Assessment/Plan:  End stage arthritis,  right knee   The patient history, physical examination, clinical judgment of the provider and imaging studies are consistent with end stage degenerative joint disease of the right knee and total knee arthroplasty is deemed medically necessary. The treatment options including medical management, injection therapy arthroscopy and arthroplasty were discussed at length. The risks and benefits of total knee arthroplasty were presented and reviewed. The risks due to aseptic loosening, infection, stiffness, patella tracking problems, thromboembolic complications and other imponderables were discussed. The patient acknowledged the explanation, agreed to proceed with the plan and consent was signed. Patient is being admitted for inpatient treatment for surgery, pain control, PT, OT, prophylactic antibiotics, VTE prophylaxis, progressive ambulation and ADLs and discharge planning. The patient is planning to be discharged  home .   Patient's anticipated LOS is less than 2 midnights, meeting these requirements: - Younger than 69 - Lives within 1 hour of care - Has a competent adult at home to recover with post-op  - NO history of  - Chronic pain requiring opioids  - Coronary Artery Disease  - Heart failure  - Heart attack  - Stroke  - DVT/VTE  - Cardiac arrhythmia  - Respiratory Failure/COPD  - Renal failure  - Anemia  - Advanced Liver disease  Therapy Plans: EO  Disposition: Home with sister and neighbor Planned DVT Prophylaxis: Aspirin 325 mg BID DME Needed: None PCP: Berneta Sages, MD (clearance received)  TXA: IV Allergies: PCN (anaphylaxis), hydrocodone (nausea), cefdinir (diarrhea), LATEX  Anesthesia Concerns: extensive lumbar hardware; requesting general anesthesia - hx of N/V with last TKA BMI: 34.6  Last HgbA1c: 6.2% (11/2021)  Pharmacy: [facility])    Other:  - Tolerates percocet - had oxycodone in with left TKA - PMH: DM type  I - wants to bring insulin pump  - Patient was instructed  on what medications to stop prior to surgery. - Follow-up visit in 2 weeks with Dr. Lequita Halt - Begin physical therapy following surgery - Pre-operative lab work as pre-surgical testing - Prescriptions will be provided in hospital at time of discharge  R. Arcola Jansky, PA-C Orthopedic Surgery [facility] [place]

## 2022-03-18 ENCOUNTER — Encounter (HOSPITAL_COMMUNITY): Payer: Medicare Other

## 2022-03-18 NOTE — Patient Instructions (Addendum)
SURGICAL WAITING ROOM VISITATION Patients having surgery or a procedure may have no more than 2 support people in the waiting area - these visitors may rotate in the visitor waiting room.   Due to an increase in RSV and influenza rates and associated hospitalizations, children ages 47 and under may not visit patients in [facility] hospitals. If the patient needs to stay at the hospital during part of their recovery, the visitor guidelines for inpatient rooms apply.  PRE-OP VISITATION  Pre-op nurse will coordinate an appropriate time for 1 support person to accompany the patient in pre-op.  This support person may not rotate.  This visitor will be contacted when the time is appropriate for the visitor to come back in the pre-op area.  Please refer to the [facility] website for the visitor guidelines for Inpatients (after your surgery is over and you are in a regular room).  You are not required to quarantine at this time prior to your surgery. However, you must do this: Hand Hygiene often Do NOT share personal items Notify your provider if you are in close contact with someone who has COVID or you develop fever 100.4 or greater, new onset of sneezing, cough, sore throat, shortness of breath or body aches.  If you test positive for Covid or have been in contact with anyone that has tested positive in the last 10 days please notify you surgeon.    Your procedure is scheduled on: Monday  March 31, 2022  Report to [facility] Main Entrance: [place] entrance where the [employer] is available.   Report to admitting at: 05:45 AM  +++++Call this number if you have any questions or problems the morning of surgery [phone]  WHAT DO I DO ABOUT MY DIABETES MEDICATION?   Do not take oral diabetes medicines (pills) the morning of surgery.   DO NOT TAKE THE FOLLOWING 7 DAYS PRIOR TO SURGERY:  Mounjaro (Tirzepatide) Do not take on Wednesday 03-26-22. :You may resume the week after  your Surgery   If your CBG is greater than 220 mg/dL, you may take  of your sliding scale  (correction) dose of insulin.  Patients with Insulin Pumps For patients with Insulin Pumps: Contact your diabetes doctor for specific instructions before surgery. Decrease basal insulin rates by 20% at midnight the night before surgery. Do not remove your insulin pump prior to arrival to short stay the morning of surgery.  Anesthesia will instruct you on when to remove your pump. Note that if your surgery is planned to be longer than 2 hours, your insulin pump will be removed and intravenous (IV) insulin will be started and managed by the nurses and anesthesiologist. You will be able to restart your insulin pump once you are awake and able to manage it. Make sure to bring insulin pump supplies to the hospital with you in case your site needs to be changed.    Do not eat food after Midnight the night prior to your surgery/procedure.  After Midnight you may have the following liquids until   05:15 AM DAY OF SURGERY  Clear Liquid Diet Water Black Coffee (sugar ok, NO MILK/CREAM OR CREAMERS)  Tea (sugar ok, NO MILK/CREAM OR CREAMERS) regular and decaf                             Plain Jell-O  with no fruit (NO RED)  Fruit ices (not with fruit pulp, NO RED)                                     Popsicles (NO RED)                                                                  Juice: apple, WHITE grape, WHITE cranberry Sports drinks like Gatorade or Powerade (NO RED)                    The day of surgery:  Drink ONE (1) Pre-Surgery G2 at 05:15  AM the morning of surgery. Drink in one sitting. Do not sip.  This drink was given to you during your hospital pre-op appointment visit. Nothing else to drink after completing the Pre-SurgeryG2 : No candy, chewing gum or throat lozenges.    FOLLOW  ANY ADDITIONAL PRE OP INSTRUCTIONS YOU RECEIVED FROM YOUR SURGEON'S  OFFICE!!!   Oral Hygiene is also important to reduce your risk of infection.        Remember - BRUSH YOUR TEETH THE MORNING OF SURGERY WITH YOUR REGULAR TOOTHPASTE  Take ONLY these medicines the morning of surgery with A SIP OF WATER: Gabapentin, Bupropion (Wellbutrin), Levocetirizine (Xyzal), levothyroxine (Synthroid).   If needed, you may take Tramadol or Tylenol, alprazolam (Xanax). You my use your albuterol inhaler, Flonase nasal spray and your Refresh Eye drops if needed.                     You may not have any metal on your body including hair pins, jewelry, and body piercing  Do not wear make-up, lotions, powders, perfumes or deodorant  Do not wear nail polish including gel and S&S, artificial / acrylic nails, or any other type of covering on natural nails including finger and toenails. If you have artificial nails, gel coating, etc., that needs to be removed by a nail salon, Please have this removed prior to surgery. Not doing so may mean that your surgery could be cancelled or delayed if the Surgeon or anesthesia staff feels like they are unable to monitor you safely.   Do not shave 48 hours prior to surgery to avoid nicks in your skin which may contribute to postoperative infections.   You may bring a small overnight bag with you on the day of surgery, only pack items that are not valuable. [facility] IS NOT RESPONSIBLE   FOR VALUABLES THAT ARE LOST OR STOLEN.   Do not bring your home medications to the hospital. The Pharmacy will dispense medications listed on your medication list to you during your admission in the Hospital.  Special Instructions: Bring a copy of your healthcare power of attorney and living will documents the day of surgery, if you wish to have them scanned into your [facility] Records- EPIC  Please read over the following fact sheets you were given: IF YOU HAVE QUESTIONS ABOUT YOUR PRE-OP INSTRUCTIONS, PLEASE CALL [phone]  (KAY)   [place] -  Preparing for Surgery Before surgery, you can play an important role.  Because skin is not sterile, your skin needs to be as free of germs  as possible.  You can reduce the number of germs on your skin by washing with CHG (chlorahexidine gluconate) soap before surgery.  CHG is an antiseptic cleaner which kills germs and bonds with the skin to continue killing germs even after washing. Please DO NOT use if you have an allergy to CHG or antibacterial soaps.  If your skin becomes reddened/irritated stop using the CHG and inform your nurse when you arrive at Short Stay. Do not shave (including legs and underarms) for at least 48 hours prior to the first CHG shower.  You may shave your face/neck.  Please follow these instructions carefully:  1.  Shower with CHG Soap the night before surgery and the  morning of surgery.  2.  If you choose to wash your hair, wash your hair first as usual with your normal  shampoo.  3.  After you shampoo, rinse your hair and body thoroughly to remove the shampoo.                             4.  Use CHG as you would any other liquid soap.  You can apply chg directly to the skin and wash.  Gently with a scrungie or clean washcloth.  5.  Apply the CHG Soap to your body ONLY FROM THE NECK DOWN.   Do not use on face/ open                           Wound or open sores. Avoid contact with eyes, ears mouth and genitals (private parts).                       Wash face,  Genitals (private parts) with your normal soap.             6.  Wash thoroughly, paying special attention to the area where your  surgery  will be performed.  7.  Thoroughly rinse your body with warm water from the neck down.  8.  DO NOT shower/wash with your normal soap after using and rinsing off the CHG Soap.            9.  Pat yourself dry with a clean towel.            10.  Wear clean pajamas.            11.  Place clean sheets on your bed the night of your first shower and do not  sleep with pets.  ON THE DAY  OF SURGERY : Do not apply any lotions/deodorants the morning of surgery.  Please wear clean clothes to the hospital/surgery center.    FAILURE TO FOLLOW THESE INSTRUCTIONS MAY RESULT IN THE CANCELLATION OF YOUR SURGERY  PATIENT SIGNATURE_________________________________  NURSE SIGNATURE__________________________________  ________________________________________________________________________       Rogelia Mire    An incentive spirometer is a tool that can help keep your lungs clear and active. This tool measures how well you are filling your lungs with each breath. Taking long deep breaths may help reverse or decrease the chance of developing breathing (pulmonary) problems (especially infection) following: A long period of time when you are unable to move or be active. BEFORE THE PROCEDURE  If the spirometer includes an indicator to show your best effort, your nurse or respiratory therapist will set it to a desired goal. If possible, sit up straight or lean  slightly forward. Try not to slouch. Hold the incentive spirometer in an upright position. INSTRUCTIONS FOR USE  Sit on the edge of your bed if possible, or sit up as far as you can in bed or on a chair. Hold the incentive spirometer in an upright position. Breathe out normally. Place the mouthpiece in your mouth and seal your lips tightly around it. Breathe in slowly and as deeply as possible, raising the piston or the ball toward the top of the column. Hold your breath for 3-5 seconds or for as long as possible. Allow the piston or ball to fall to the bottom of the column. Remove the mouthpiece from your mouth and breathe out normally. Rest for a few seconds and repeat Steps 1 through 7 at least 10 times every 1-2 hours when you are awake. Take your time and take a few normal breaths between deep breaths. The spirometer may include an indicator to show your best effort. Use the indicator as a goal to work toward  during each repetition. After each set of 10 deep breaths, practice coughing to be sure your lungs are clear. If you have an incision (the cut made at the time of surgery), support your incision when coughing by placing a pillow or rolled up towels firmly against it. Once you are able to get out of bed, walk around indoors and cough well. You may stop using the incentive spirometer when instructed by your caregiver.  RISKS AND COMPLICATIONS Take your time so you do not get dizzy or light-headed. If you are in pain, you may need to take or ask for pain medication before doing incentive spirometry. It is harder to take a deep breath if you are having pain. AFTER USE Rest and breathe slowly and easily. It can be helpful to keep track of a log of your progress. Your caregiver can provide you with a simple table to help with this. If you are using the spirometer at home, follow these instructions: [place] IF:  You are having difficultly using the spirometer. You have trouble using the spirometer as often as instructed. Your pain medication is not giving enough relief while using the spirometer. You develop fever of 100.5 F (38.1 C) or higher.                                                                                                    SEEK IMMEDIATE MEDICAL CARE IF:  You cough up bloody sputum that had not been present before. You develop fever of 102 F (38.9 C) or greater. You develop worsening pain at or near the incision site. MAKE SURE YOU:  Understand these instructions. Will watch your condition. Will get help right away if you are not doing well or get worse. Document Released: 07/07/2006 Document Revised: 05/19/2011 Document Reviewed: 09/07/2006 [facility] Patient Information 2014 [place], [place].

## 2022-03-18 NOTE — Progress Notes (Signed)
COVID Vaccine received:  _0  No _1  Yes Date of any COVID positive Test in last 90 days:  None  PCP - Dr. Steva Ready Avva:  Cardiologist - none now but would go to Dr. Sherren Mocha (Her husband sees [facility])  Chest x-ray -  EKG - 09-17-21  Epic  Stress Test -  ECHO -08-07-2019  epic Cardiac Cath -   PCR screen: _2  Ordered & Completed                      _3   No Order but Needs PROFEND                      _4   N/A for this surgery  Surgery Plan:  _5  Ambulatory                            _6  Outpatient in bed                            _7  Admit  Anesthesia:    _8  General  _9  Spinal    Pt wants General  _10   Choice _11   MAC  Pacemaker / ICD device _12  No _13  Yes        Device order form faxed _14  No    _15   Yes      Faxed to:  Spinal Cord Stimulator:_16  No _17  Yes      (Remind patient to bring remote DOS) Other Implants:   History of Sleep Apnea? _18  No _19  Yes   CPAP used?- _20  No _21  Yes    DM1 - On Insulin pump, has attached DEXCOM monitor    CBG  168 at PST Does the patient monitor blood sugar? _22  No _23  Yes  _24  N/A Does patient have a [employer] or Dexacom? _25  No _26  Yes   Fasting Blood Sugar Ranges-  Checks Blood Sugar continuous times a day  Last dose of GLP1 agonist- Mounjaro (Wednesday) GLP1 instructions: patient had hold date in her calendar  The Diabetes Coordinator, Abigail Butts, and made her aware of this surgery and the patient's pump.   Blood Thinner / Instructions: None Aspirin Instructions:  none  ERAS Protocol Ordered: _27  No  _28  Yes PRE-SURGERY _29  ENSURE  _30  G2  Patient is to be NPO after: 05:15 am  Comments: Patient's sister will be with her and she is aware of how to manage Mrs. Zubiate's Insulin Pump should it be necessary. Mrs. Thurlow signed the Insulin Pump agreement and know to bring her own supplies.   Activity level: Patient can climb a flight of stairs without difficulty; _31  No CP  _32  No SOB, but would have severe leg pain  Patient can  perform ADLs without assistance.   Anesthesia review: DM1, heart murmur, multi. Back surgeries- fusions for scoliosis, ACDF also, anemia, PONV, GERD  Patient denies shortness of breath, fever, cough and chest pain at PAT appointment.  Patient verbalized understanding and agreement to the Pre-Surgical Instructions that were given to them at this PAT appointment. Patient was also educated of the need to review these PAT instructions again prior to his/her surgery.I reviewed the appropriate phone numbers to call if they have any and questions or concerns.

## 2022-03-20 ENCOUNTER — Encounter (HOSPITAL_COMMUNITY)
Admission: RE | Admit: 2022-03-20 | Discharge: 2022-03-20 | Disposition: A | Discharge status: Home / Self Care | Payer: Medicare Other | Source: Ambulatory Visit | Attending: Orthopedic Surgery | Admitting: Orthopedic Surgery

## 2022-03-20 ENCOUNTER — Encounter (HOSPITAL_COMMUNITY): Payer: Self-pay

## 2022-03-20 ENCOUNTER — Other Ambulatory Visit: Payer: Self-pay

## 2022-03-20 VITALS — BP 115/52 | HR 75 | Temp 98.0°F | Resp 16 | Ht 61.25 in | Wt 165.0 lb

## 2022-03-20 DIAGNOSIS — D649 Anemia, unspecified: Secondary | ICD-10-CM | POA: Insufficient documentation

## 2022-03-20 DIAGNOSIS — Z01812 Encounter for preprocedural laboratory examination: Secondary | ICD-10-CM | POA: Insufficient documentation

## 2022-03-20 DIAGNOSIS — E109 Type 1 diabetes mellitus without complications: Secondary | ICD-10-CM | POA: Insufficient documentation

## 2022-03-20 DIAGNOSIS — R011 Cardiac murmur, unspecified: Secondary | ICD-10-CM | POA: Diagnosis not present

## 2022-03-20 DIAGNOSIS — Z01818 Encounter for other preprocedural examination: Secondary | ICD-10-CM

## 2022-03-20 LAB — CBC
HCT: 38.6 % (ref 36.0–46.0)
Hemoglobin: 12.5 g/dL (ref 12.0–15.0)
MCH: 32.7 pg (ref 26.0–34.0)
MCHC: 32.4 g/dL (ref 30.0–36.0)
MCV: 101 fL — ABNORMAL HIGH (ref 80.0–100.0)
Platelets: 226 10*3/uL (ref 150–400)
RBC: 3.82 MIL/uL — ABNORMAL LOW (ref 3.87–5.11)
RDW: 11.5 % (ref 11.5–15.5)
WBC: 5.9 10*3/uL (ref 4.0–10.5)
nRBC: 0 % (ref 0.0–0.2)

## 2022-03-20 LAB — BASIC METABOLIC PANEL
Anion gap: 8 (ref 5–15)
BUN: 17 mg/dL (ref 8–23)
CO2: 25 mmol/L (ref 22–32)
Calcium: 9.9 mg/dL (ref 8.9–10.3)
Chloride: 102 mmol/L (ref 98–111)
Creatinine, Ser: 0.98 mg/dL (ref 0.44–1.00)
GFR, Estimated: >60 mL/min (ref >60)
Glucose, Bld: 157 mg/dL — ABNORMAL HIGH (ref 70–99)
Potassium: 5.2 mmol/L — ABNORMAL HIGH (ref 3.5–5.1)
Sodium: 135 mmol/L (ref 135–145)

## 2022-03-20 LAB — HEMOGLOBIN A1C
Hgb A1c MFr Bld: 5.9 % — ABNORMAL HIGH (ref 4.8–5.6)
Mean Plasma Glucose: 122.63 mg/dL

## 2022-03-20 LAB — SURGICAL PCR SCREEN
MRSA, PCR: NEGATIVE
Staphylococcus aureus: NEGATIVE

## 2022-03-25 ENCOUNTER — Ambulatory Visit (INDEPENDENT_AMBULATORY_CARE_PROVIDER_SITE_OTHER): Payer: Medicare Other | Admitting: Family Medicine

## 2022-03-25 ENCOUNTER — Encounter (INDEPENDENT_AMBULATORY_CARE_PROVIDER_SITE_OTHER): Payer: Self-pay | Admitting: Family Medicine

## 2022-03-25 VITALS — BP 130/70 | HR 78 | Temp 97.9°F | Ht 62.0 in | Wt 164.0 lb

## 2022-03-25 DIAGNOSIS — M179 Osteoarthritis of knee, unspecified: Secondary | ICD-10-CM

## 2022-03-25 DIAGNOSIS — E669 Obesity, unspecified: Secondary | ICD-10-CM | POA: Diagnosis not present

## 2022-03-25 DIAGNOSIS — E1069 Type 1 diabetes mellitus with other specified complication: Secondary | ICD-10-CM

## 2022-03-25 DIAGNOSIS — Z636 Dependent relative needing care at home: Secondary | ICD-10-CM

## 2022-03-25 DIAGNOSIS — F411 Generalized anxiety disorder: Secondary | ICD-10-CM | POA: Insufficient documentation

## 2022-03-25 DIAGNOSIS — Z6830 Body mass index (BMI) 30.0-30.9, adult: Secondary | ICD-10-CM

## 2022-03-25 DIAGNOSIS — Z7985 Long-term (current) use of injectable non-insulin antidiabetic drugs: Secondary | ICD-10-CM

## 2022-03-25 MED ORDER — ESCITALOPRAM OXALATE 5 MG PO TABS: 5.0000 mg | ORAL_TABLET | Freq: Every day | ORAL | 0 refills | Status: DC

## 2022-03-31 ENCOUNTER — Encounter (HOSPITAL_COMMUNITY)
Admission: RE | Disposition: A | Discharge status: Home / Self Care | Payer: Self-pay | Source: Home / Self Care | Attending: Orthopedic Surgery

## 2022-03-31 ENCOUNTER — Other Ambulatory Visit: Payer: Self-pay

## 2022-03-31 ENCOUNTER — Observation Stay (HOSPITAL_COMMUNITY)
Admission: RE | Admit: 2022-03-31 | Discharge: 2022-04-01 | Disposition: A | Discharge status: Home / Self Care | Payer: Medicare Other | Attending: Orthopedic Surgery | Admitting: Orthopedic Surgery

## 2022-03-31 ENCOUNTER — Ambulatory Visit (HOSPITAL_COMMUNITY): Payer: Medicare Other | Admitting: Certified Registered Nurse Anesthetist

## 2022-03-31 ENCOUNTER — Ambulatory Visit (HOSPITAL_BASED_OUTPATIENT_CLINIC_OR_DEPARTMENT_OTHER): Payer: Medicare Other | Admitting: Certified Registered Nurse Anesthetist

## 2022-03-31 ENCOUNTER — Encounter (HOSPITAL_COMMUNITY): Payer: Self-pay | Admitting: Orthopedic Surgery

## 2022-03-31 DIAGNOSIS — E039 Hypothyroidism, unspecified: Secondary | ICD-10-CM | POA: Diagnosis not present

## 2022-03-31 DIAGNOSIS — Z96652 Presence of left artificial knee joint: Secondary | ICD-10-CM | POA: Insufficient documentation

## 2022-03-31 DIAGNOSIS — Z794 Long term (current) use of insulin: Secondary | ICD-10-CM | POA: Insufficient documentation

## 2022-03-31 DIAGNOSIS — Z87891 Personal history of nicotine dependence: Secondary | ICD-10-CM | POA: Diagnosis not present

## 2022-03-31 DIAGNOSIS — M1712 Unilateral primary osteoarthritis, left knee: Secondary | ICD-10-CM | POA: Diagnosis not present

## 2022-03-31 DIAGNOSIS — M1711 Unilateral primary osteoarthritis, right knee: Secondary | ICD-10-CM | POA: Diagnosis present

## 2022-03-31 DIAGNOSIS — E109 Type 1 diabetes mellitus without complications: Secondary | ICD-10-CM | POA: Diagnosis not present

## 2022-03-31 DIAGNOSIS — Z79899 Other long term (current) drug therapy: Secondary | ICD-10-CM | POA: Diagnosis not present

## 2022-03-31 DIAGNOSIS — Z01818 Encounter for other preprocedural examination: Secondary | ICD-10-CM

## 2022-03-31 HISTORY — PX: TOTAL KNEE ARTHROPLASTY: SHX125

## 2022-03-31 LAB — GLUCOSE, CAPILLARY
Glucose-Capillary: 215 mg/dL — ABNORMAL HIGH (ref 70–99)
Glucose-Capillary: 178 mg/dL — ABNORMAL HIGH (ref 70–99)

## 2022-03-31 SURGERY — ARTHROPLASTY, KNEE, TOTAL
Anesthesia: General | Site: Knee | Laterality: Right

## 2022-03-31 MED ORDER — METHOCARBAMOL 500 MG IVPB - SIMPLE MED
500.0000 mg | Freq: Four times a day (QID) | INTRAVENOUS | Status: DC | PRN
  Administered 2022-03-31: 500 mg via INTRAVENOUS

## 2022-03-31 MED ORDER — ASPIRIN 325 MG PO TBEC
325.0000 mg | DELAYED_RELEASE_TABLET | Freq: Two times a day (BID) | ORAL | Status: DC
  Administered 2022-04-01: 325 mg via ORAL
  Filled 2022-03-31: qty 1

## 2022-03-31 MED ORDER — VANCOMYCIN HCL IN DEXTROSE 1-5 GM/200ML-% IV SOLN
1000.0000 mg | INTRAVENOUS | Status: AC
  Administered 2022-03-31: 1000 mg via INTRAVENOUS
  Filled 2022-03-31: qty 200

## 2022-03-31 MED ORDER — ALBUTEROL SULFATE (2.5 MG/3ML) 0.083% IN NEBU: 2.5000 mg | INHALATION_SOLUTION | RESPIRATORY_TRACT | Status: DC | PRN

## 2022-03-31 MED ORDER — POLYETHYLENE GLYCOL 3350 17 G PO PACK: 17.0000 g | PACK | Freq: Every day | ORAL | Status: DC | PRN

## 2022-03-31 MED ORDER — BUPIVACAINE LIPOSOME 1.3 % IJ SUSP
INTRAMUSCULAR | Status: DC | PRN
  Administered 2022-03-31: 20 mL

## 2022-03-31 MED ORDER — OXYCODONE HCL 5 MG PO TABS
5.0000 mg | ORAL_TABLET | ORAL | Status: DC | PRN
  Administered 2022-03-31 – 2022-04-01 (×6): 10 mg via ORAL
  Filled 2022-03-31 (×6): qty 2

## 2022-03-31 MED ORDER — CONTINUOUS GLUCOSE MONITOR DEVI: 1.0000 | Status: DC

## 2022-03-31 MED ORDER — LACTATED RINGERS IV SOLN: INTRAVENOUS | Status: DC

## 2022-03-31 MED ORDER — BISACODYL 10 MG RE SUPP: 10.0000 mg | Freq: Every day | RECTAL | Status: DC | PRN

## 2022-03-31 MED ORDER — METHOCARBAMOL 500 MG PO TABS
500.0000 mg | ORAL_TABLET | Freq: Four times a day (QID) | ORAL | Status: DC | PRN
  Administered 2022-03-31 – 2022-04-01 (×2): 500 mg via ORAL
  Filled 2022-03-31 (×3): qty 1

## 2022-03-31 MED ORDER — BUPIVACAINE LIPOSOME 1.3 % IJ SUSP
INTRAMUSCULAR | Status: AC
  Filled 2022-03-31: qty 20

## 2022-03-31 MED ORDER — KETAMINE HCL 50 MG/ML IJ SOLN
INTRAMUSCULAR | Status: DC | PRN
  Administered 2022-03-31: 40 mg via INTRAMUSCULAR

## 2022-03-31 MED ORDER — DIPHENHYDRAMINE HCL 12.5 MG/5ML PO ELIX
12.5000 mg | ORAL_SOLUTION | ORAL | Status: DC | PRN
  Administered 2022-04-01: 25 mg via ORAL
  Filled 2022-03-31: qty 10

## 2022-03-31 MED ORDER — MIDAZOLAM HCL 2 MG/2ML IJ SOLN
1.0000 mg | INTRAMUSCULAR | Status: DC
  Filled 2022-03-31: qty 2

## 2022-03-31 MED ORDER — LEVOCETIRIZINE DIHYDROCHLORIDE 5 MG PO TABS: 5.0000 mg | ORAL_TABLET | Freq: Every day | ORAL | Status: DC

## 2022-03-31 MED ORDER — ACETAMINOPHEN 10 MG/ML IV SOLN
1000.0000 mg | Freq: Four times a day (QID) | INTRAVENOUS | Status: DC
  Administered 2022-03-31: 1000 mg via INTRAVENOUS
  Filled 2022-03-31: qty 100

## 2022-03-31 MED ORDER — SODIUM CHLORIDE 0.9 % IR SOLN
Status: DC | PRN
  Administered 2022-03-31: 1000 mL

## 2022-03-31 MED ORDER — METOCLOPRAMIDE HCL 5 MG PO TABS: 5.0000 mg | ORAL_TABLET | Freq: Three times a day (TID) | ORAL | Status: DC | PRN

## 2022-03-31 MED ORDER — FENTANYL CITRATE PF 50 MCG/ML IJ SOSY
25.0000 ug | PREFILLED_SYRINGE | INTRAMUSCULAR | Status: DC | PRN
  Administered 2022-03-31 (×4): 25 ug via INTRAVENOUS

## 2022-03-31 MED ORDER — METHOCARBAMOL 500 MG IVPB - SIMPLE MED
INTRAVENOUS | Status: AC
  Filled 2022-03-31: qty 55

## 2022-03-31 MED ORDER — FLUTICASONE PROPIONATE 50 MCG/ACT NA SUSP: 2.0000 | Freq: Every day | NASAL | Status: DC | PRN

## 2022-03-31 MED ORDER — ONDANSETRON HCL 4 MG/2ML IJ SOLN
INTRAMUSCULAR | Status: DC | PRN
  Administered 2022-03-31: 4 mg via INTRAVENOUS

## 2022-03-31 MED ORDER — INSULIN ASPART 100 UNIT/ML ~~LOC~~ SOLN: 100.0000 [IU] | SUBCUTANEOUS | Status: DC

## 2022-03-31 MED ORDER — STERILE WATER FOR IRRIGATION IR SOLN
Status: DC | PRN
  Administered 2022-03-31: 2000 mL

## 2022-03-31 MED ORDER — DEXAMETHASONE SODIUM PHOSPHATE 10 MG/ML IJ SOLN
10.0000 mg | Freq: Once | INTRAMUSCULAR | Status: AC
  Administered 2022-04-01: 10 mg via INTRAVENOUS
  Filled 2022-03-31: qty 1

## 2022-03-31 MED ORDER — ONDANSETRON HCL 4 MG/2ML IJ SOLN: 4.0000 mg | Freq: Four times a day (QID) | INTRAMUSCULAR | Status: DC | PRN

## 2022-03-31 MED ORDER — ORAL CARE MOUTH RINSE: 15.0000 mL | Freq: Once | OROMUCOSAL | Status: AC

## 2022-03-31 MED ORDER — FENTANYL CITRATE (PF) 100 MCG/2ML IJ SOLN
INTRAMUSCULAR | Status: DC | PRN
  Administered 2022-03-31: 100 ug via INTRAVENOUS

## 2022-03-31 MED ORDER — POVIDONE-IODINE 10 % EX SWAB: 2.0000 | Freq: Once | CUTANEOUS | Status: DC

## 2022-03-31 MED ORDER — CHLORHEXIDINE GLUCONATE 0.12 % MT SOLN
15.0000 mL | Freq: Once | OROMUCOSAL | Status: AC
  Administered 2022-03-31: 15 mL via OROMUCOSAL

## 2022-03-31 MED ORDER — KETAMINE HCL 50 MG/5ML IJ SOSY
PREFILLED_SYRINGE | INTRAMUSCULAR | Status: AC
  Filled 2022-03-31: qty 5

## 2022-03-31 MED ORDER — FENTANYL CITRATE PF 50 MCG/ML IJ SOSY
PREFILLED_SYRINGE | INTRAMUSCULAR | Status: AC
  Filled 2022-03-31: qty 1

## 2022-03-31 MED ORDER — FERROUS SULFATE 325 (65 FE) MG PO TABS: 325.0000 mg | ORAL_TABLET | Freq: Every day | ORAL | Status: DC

## 2022-03-31 MED ORDER — VANCOMYCIN HCL IN DEXTROSE 1-5 GM/200ML-% IV SOLN
1000.0000 mg | Freq: Two times a day (BID) | INTRAVENOUS | Status: AC
  Administered 2022-03-31: 1000 mg via INTRAVENOUS
  Filled 2022-03-31: qty 200

## 2022-03-31 MED ORDER — GLUCAGON 3 MG/DOSE NA POWD: 3.0000 mg | NASAL | Status: DC | PRN

## 2022-03-31 MED ORDER — ONDANSETRON HCL 4 MG PO TABS: 4.0000 mg | ORAL_TABLET | Freq: Four times a day (QID) | ORAL | Status: DC | PRN

## 2022-03-31 MED ORDER — ALPRAZOLAM 0.5 MG PO TABS: 0.5000 mg | ORAL_TABLET | Freq: Four times a day (QID) | ORAL | Status: DC | PRN

## 2022-03-31 MED ORDER — ACETAMINOPHEN 500 MG PO TABS
1000.0000 mg | ORAL_TABLET | Freq: Four times a day (QID) | ORAL | Status: AC
  Administered 2022-03-31 – 2022-04-01 (×4): 1000 mg via ORAL
  Filled 2022-03-31 (×4): qty 2

## 2022-03-31 MED ORDER — INSULIN ASPART 100 UNIT/ML IJ SOLN
0.0000 [IU] | Freq: Three times a day (TID) | INTRAMUSCULAR | Status: DC
  Administered 2022-04-01: 5 [IU] via SUBCUTANEOUS
  Administered 2022-04-01: 4 [IU] via SUBCUTANEOUS

## 2022-03-31 MED ORDER — SODIUM CHLORIDE (PF) 0.9 % IJ SOLN
INTRAMUSCULAR | Status: DC | PRN
  Administered 2022-03-31: 60 mL

## 2022-03-31 MED ORDER — INSULIN PUMP
SUBCUTANEOUS | Status: DC
  Filled 2022-03-31: qty 1

## 2022-03-31 MED ORDER — POLYVINYL ALCOHOL 1.4 % OP SOLN
1.0000 [drp] | Freq: Two times a day (BID) | OPHTHALMIC | Status: DC
  Filled 2022-03-31: qty 15

## 2022-03-31 MED ORDER — METOCLOPRAMIDE HCL 5 MG/ML IJ SOLN: 5.0000 mg | Freq: Three times a day (TID) | INTRAMUSCULAR | Status: DC | PRN

## 2022-03-31 MED ORDER — BUPROPION HCL ER (XL) 300 MG PO TB24
300.0000 mg | ORAL_TABLET | Freq: Every day | ORAL | Status: DC
  Administered 2022-04-01: 300 mg via ORAL
  Filled 2022-03-31: qty 1

## 2022-03-31 MED ORDER — DOCUSATE SODIUM 100 MG PO CAPS
100.0000 mg | ORAL_CAPSULE | Freq: Two times a day (BID) | ORAL | Status: DC
  Administered 2022-03-31 – 2022-04-01 (×2): 100 mg via ORAL
  Filled 2022-03-31 (×2): qty 1

## 2022-03-31 MED ORDER — LORATADINE 10 MG PO TABS
10.0000 mg | ORAL_TABLET | Freq: Every day | ORAL | Status: DC
  Administered 2022-04-01: 10 mg via ORAL
  Filled 2022-03-31: qty 1

## 2022-03-31 MED ORDER — 0.9 % SODIUM CHLORIDE (POUR BTL) OPTIME
TOPICAL | Status: DC | PRN
  Administered 2022-03-31: 1000 mL

## 2022-03-31 MED ORDER — POTASSIUM GLUCONATE 595 (99 K) MG PO TABS
595.0000 mg | ORAL_TABLET | Freq: Every day | ORAL | Status: DC
  Filled 2022-03-31: qty 1

## 2022-03-31 MED ORDER — MORPHINE SULFATE (PF) 2 MG/ML IV SOLN
1.0000 mg | INTRAVENOUS | Status: DC | PRN
  Administered 2022-03-31: 1 mg via INTRAVENOUS
  Administered 2022-03-31: 2 mg via INTRAVENOUS
  Administered 2022-03-31: 1 mg via INTRAVENOUS
  Administered 2022-04-01: 2 mg via INTRAVENOUS
  Filled 2022-03-31 (×6): qty 1

## 2022-03-31 MED ORDER — TRANEXAMIC ACID-NACL 1000-0.7 MG/100ML-% IV SOLN
1000.0000 mg | INTRAVENOUS | Status: AC
  Administered 2022-03-31: 1000 mg via INTRAVENOUS
  Filled 2022-03-31: qty 100

## 2022-03-31 MED ORDER — SODIUM CHLORIDE (PF) 0.9 % IJ SOLN
INTRAMUSCULAR | Status: AC
  Filled 2022-03-31: qty 10

## 2022-03-31 MED ORDER — SODIUM CHLORIDE (PF) 0.9 % IJ SOLN
INTRAMUSCULAR | Status: AC
  Filled 2022-03-31: qty 50

## 2022-03-31 MED ORDER — FENTANYL CITRATE PF 50 MCG/ML IJ SOSY
50.0000 ug | PREFILLED_SYRINGE | INTRAMUSCULAR | Status: DC
  Filled 2022-03-31: qty 2

## 2022-03-31 MED ORDER — MENTHOL 3 MG MT LOZG: 1.0000 | LOZENGE | OROMUCOSAL | Status: DC | PRN

## 2022-03-31 MED ORDER — LEVOTHYROXINE SODIUM 88 MCG PO TABS
88.0000 ug | ORAL_TABLET | Freq: Every day | ORAL | Status: DC
  Administered 2022-04-01: 88 ug via ORAL
  Filled 2022-03-31: qty 1

## 2022-03-31 MED ORDER — ATORVASTATIN CALCIUM 10 MG PO TABS
10.0000 mg | ORAL_TABLET | Freq: Every day | ORAL | Status: DC
  Administered 2022-03-31: 10 mg via ORAL
  Filled 2022-03-31: qty 1

## 2022-03-31 MED ORDER — ROPIVACAINE HCL 5 MG/ML IJ SOLN
INTRAMUSCULAR | Status: DC | PRN
  Administered 2022-03-31: 25 mL via PERINEURAL

## 2022-03-31 MED ORDER — ESCITALOPRAM OXALATE 10 MG PO TABS
5.0000 mg | ORAL_TABLET | Freq: Every day | ORAL | Status: DC
  Administered 2022-04-01: 5 mg via ORAL
  Filled 2022-03-31: qty 1

## 2022-03-31 MED ORDER — PROPOFOL 500 MG/50ML IV EMUL
INTRAVENOUS | Status: DC | PRN
  Administered 2022-03-31: 150 ug/kg/min via INTRAVENOUS

## 2022-03-31 MED ORDER — PHENOL 1.4 % MT LIQD: 1.0000 | OROMUCOSAL | Status: DC | PRN

## 2022-03-31 MED ORDER — DEXAMETHASONE SODIUM PHOSPHATE 10 MG/ML IJ SOLN
8.0000 mg | Freq: Once | INTRAMUSCULAR | Status: AC
  Administered 2022-03-31: 8 mg via INTRAVENOUS

## 2022-03-31 MED ORDER — MIDAZOLAM HCL 5 MG/5ML IJ SOLN
INTRAMUSCULAR | Status: DC | PRN
  Administered 2022-03-31: 2 mg via INTRAVENOUS

## 2022-03-31 MED ORDER — BUPIVACAINE LIPOSOME 1.3 % IJ SUSP: 20.0000 mL | Freq: Once | INTRAMUSCULAR | Status: DC

## 2022-03-31 MED ORDER — SODIUM CHLORIDE 0.9 % IV SOLN: INTRAVENOUS | Status: DC

## 2022-03-31 MED ORDER — FLEET ENEMA 7-19 GM/118ML RE ENEM: 1.0000 | ENEMA | Freq: Once | RECTAL | Status: DC | PRN

## 2022-03-31 MED ORDER — GABAPENTIN 300 MG PO CAPS
300.0000 mg | ORAL_CAPSULE | Freq: Four times a day (QID) | ORAL | Status: DC
  Administered 2022-03-31 – 2022-04-01 (×5): 300 mg via ORAL
  Filled 2022-03-31 (×6): qty 1

## 2022-03-31 SURGICAL SUPPLY — 56 items
ATTUNE PS FEM RT SZ 5 CEM KNEE (Femur) IMPLANT
ATTUNE PSRP INSR SZ 5 10M KNEE (Insert) IMPLANT
BAG COUNTER SPONGE SURGICOUNT (BAG) IMPLANT
BAG SPEC THK2 15X12 ZIP CLS (MISCELLANEOUS) ×1
BAG SPNG CNTER NS LX DISP (BAG)
BAG ZIPLOCK 12X15 (MISCELLANEOUS) ×2 IMPLANT
BASEPLATE TIBIAL ROTATING SZ 4 (Knees) IMPLANT
BLADE SAG 18X100X1.27 (BLADE) ×2 IMPLANT
BLADE SAW SGTL 11.0X1.19X90.0M (BLADE) ×2 IMPLANT
BNDG ELASTIC 6X5.8 VLCR STR LF (GAUZE/BANDAGES/DRESSINGS) ×2 IMPLANT
BOWL SMART MIX CTS (DISPOSABLE) ×2 IMPLANT
BSPLAT TIB 4 CMNT ROT PLAT STR (Knees) ×1 IMPLANT
CEMENT HV SMART SET (Cement) ×4 IMPLANT
CLSR STERI-STRIP ANTIMIC 1/2X4 (GAUZE/BANDAGES/DRESSINGS) IMPLANT
COVER SURGICAL LIGHT HANDLE (MISCELLANEOUS) ×2 IMPLANT
CUFF TOURN SGL QUICK 34 (TOURNIQUET CUFF) ×1
CUFF TRNQT CYL 34X4.125X (TOURNIQUET CUFF) ×2 IMPLANT
DRAPE INCISE IOBAN 66X45 STRL (DRAPES) ×2 IMPLANT
DRAPE U-SHAPE 47X51 STRL (DRAPES) ×2 IMPLANT
DRSG AQUACEL AG ADV 3.5X10 (GAUZE/BANDAGES/DRESSINGS) ×2 IMPLANT
DURAPREP 26ML APPLICATOR (WOUND CARE) ×2 IMPLANT
ELECT REM PT RETURN 15FT ADLT (MISCELLANEOUS) ×2 IMPLANT
GLOVE BIO SURGEON STRL SZ 6.5 (GLOVE) IMPLANT
GLOVE BIO SURGEON STRL SZ7.5 (GLOVE) IMPLANT
GLOVE BIO SURGEON STRL SZ8 (GLOVE) ×2 IMPLANT
GLOVE BIOGEL PI IND STRL 6.5 (GLOVE) IMPLANT
GLOVE BIOGEL PI IND STRL 7.0 (GLOVE) IMPLANT
GLOVE BIOGEL PI IND STRL 8 (GLOVE) ×2 IMPLANT
GOWN STRL REUS W/ TWL LRG LVL3 (GOWN DISPOSABLE) ×2 IMPLANT
GOWN STRL REUS W/ TWL XL LVL3 (GOWN DISPOSABLE) IMPLANT
GOWN STRL REUS W/TWL LRG LVL3 (GOWN DISPOSABLE) ×1
GOWN STRL REUS W/TWL XL LVL3 (GOWN DISPOSABLE)
HANDPIECE INTERPULSE COAX TIP (DISPOSABLE) ×1
HOLDER FOLEY CATH W/STRAP (MISCELLANEOUS) IMPLANT
IMMOBILIZER KNEE 20 (SOFTGOODS) ×1
IMMOBILIZER KNEE 20 THIGH 36 (SOFTGOODS) ×2 IMPLANT
KIT TURNOVER KIT A (KITS) IMPLANT
MANIFOLD NEPTUNE II (INSTRUMENTS) ×2 IMPLANT
NS IRRIG 1000ML POUR BTL (IV SOLUTION) ×2 IMPLANT
PACK TOTAL KNEE CUSTOM (KITS) ×2 IMPLANT
PADDING CAST COTTON 6X4 STRL (CAST SUPPLIES) ×4 IMPLANT
PATELLA MEDIAL ATTUN 35MM KNEE (Knees) IMPLANT
PIN STEINMAN FIXATION KNEE (PIN) IMPLANT
PROTECTOR NERVE ULNAR (MISCELLANEOUS) ×2 IMPLANT
SET HNDPC FAN SPRY TIP SCT (DISPOSABLE) ×2 IMPLANT
SPIKE FLUID TRANSFER (MISCELLANEOUS) ×2 IMPLANT
STRIP CLOSURE SKIN 1/2X4 (GAUZE/BANDAGES/DRESSINGS) ×4 IMPLANT
SUT MNCRL AB 4-0 PS2 18 (SUTURE) ×2 IMPLANT
SUT STRATAFIX 0 PDS 27 VIOLET (SUTURE) ×1
SUT VIC AB 2-0 CT1 27 (SUTURE) ×3
SUT VIC AB 2-0 CT1 TAPERPNT 27 (SUTURE) ×6 IMPLANT
SUTURE STRATFX 0 PDS 27 VIOLET (SUTURE) ×2 IMPLANT
TRAY FOLEY MTR SLVR 16FR STAT (SET/KITS/TRAYS/PACK) ×2 IMPLANT
TUBE SUCTION HIGH CAP CLEAR NV (SUCTIONS) ×2 IMPLANT
WATER STERILE IRR 1000ML POUR (IV SOLUTION) ×4 IMPLANT
WRAP KNEE MAXI GEL POST OP (GAUZE/BANDAGES/DRESSINGS) ×2 IMPLANT

## 2022-03-31 NOTE — Anesthesia Procedure Notes (Signed)
Procedure Name: LMA Insertion Date/Time: 03/31/2022 8:41 AM  Performed by: Gean Maidens, CRNAPre-anesthesia Checklist: Patient identified, Emergency Drugs available, Suction available, Patient being monitored and Timeout performed Patient Re-evaluated:Patient Re-evaluated prior to induction Oxygen Delivery Method: Circle system utilized Preoxygenation: Pre-oxygenation with 100% oxygen Induction Type: IV induction Ventilation: Mask ventilation without difficulty LMA: LMA inserted LMA Size: 4.0 Number of attempts: 1 Placement Confirmation: positive ETCO2 and breath sounds checked- equal and bilateral Tube secured with: Tape Dental Injury: Teeth and Oropharynx as per pre-operative assessment

## 2022-03-31 NOTE — Plan of Care (Signed)
  Problem: Pain Management: Goal: Pain level will decrease with appropriate interventions Outcome: Progressing   Problem: Activity: Goal: Risk for activity intolerance will decrease Outcome: Progressing   Problem: Safety: Goal: Ability to remain free from injury will improve Outcome: Progressing

## 2022-03-31 NOTE — Interval H&P Note (Signed)
History and Physical Interval Note:  03/31/2022 6:29 AM  Colleen Zuniga  has presented today for surgery, with the diagnosis of right knee osteoarthritis.  The various methods of treatment have been discussed with the patient and family. After consideration of risks, benefits and other options for treatment, the patient has consented to  Procedure(s): TOTAL KNEE ARTHROPLASTY (Right) as a surgical intervention.  The patient's history has been reviewed, patient examined, no change in status, stable for surgery.  I have reviewed the patient's chart and labs.  Questions were answered to the patient's satisfaction.     Pilar Plate Prophet Renwick

## 2022-03-31 NOTE — Op Note (Addendum)
OPERATIVE REPORT-TOTAL KNEE ARTHROPLASTY   Pre-operative diagnosis- Osteoarthritis  Right knee(s)  Post-operative diagnosis- Osteoarthritis Right knee(s)  Procedure-  Right  Total Knee Arthroplasty  Surgeon- Dione Plover. Rasha Ibe, MD  Assistant- Molli Barrows, PA-C   Anesthesia-  General and adductor canal block  EBL- 25 ml   Drains None  Tourniquet time-  Total Tourniquet Time Documented: Thigh (Right) - 35 minutes Total: Thigh (Right) - 35 minutes     Complications- None  Condition-PACU - hemodynamically stable.   Brief Clinical Note  Colleen Zuniga is a 62 y.o. year old female with end stage OA of her right knee with progressively worsening pain and dysfunction. She has constant pain, with activity and at rest and significant functional deficits with difficulties even with ADLs. She has had extensive non-op management including analgesics, injections of cortisone and viscosupplements, and home exercise program, but remains in significant pain with significant dysfunction.Radiographs show bone on bone arthritis medial and patellofemoral. She presents now for right Total Knee Arthroplasty.     Procedure in detail---   The patient is brought into the operating room and positioned supine on the operating table. After successful administration of  adductor canal block and general, a tourniquet is placed high on the  Right thigh(s) and the lower extremity is prepped and draped in the usual sterile fashion. Time out is performed by the operating team and then the  Right lower extremity is wrapped in Esmarch, knee flexed and the tourniquet inflated to 300 mmHg.       A midline incision is made with a ten blade through the subcutaneous tissue to the level of the extensor mechanism. A fresh blade is used to make a medial parapatellar arthrotomy. Soft tissue over the proximal medial tibia is subperiosteally elevated to the joint line with a knife and into the semimembranosus bursa with a  Cobb elevator. Soft tissue over the proximal lateral tibia is elevated with attention being paid to avoiding the patellar tendon on the tibial tubercle. The patella is everted, knee flexed 90 degrees and the ACL and PCL are removed. Findings are bone on bone medial and patellofemoral with large global osteophytes        The drill is used to create a starting hole in the distal femur and the canal is thoroughly irrigated with sterile saline to remove the fatty contents. The 5 degree Right  valgus alignment guide is placed into the femoral canal and the distal femoral cutting block is pinned to remove 9 mm off the distal femur. Resection is made with an oscillating saw.      The tibia is subluxed forward and the menisci are removed. The extramedullary alignment guide is placed referencing proximally at the medial aspect of the tibial tubercle and distally along the second metatarsal axis and tibial crest. The block is pinned to remove 43mm off the more deficient medial  side. Resection is made with an oscillating saw. Size 4is the most appropriate size for the tibia and the proximal tibia is prepared with the modular drill and keel punch for that size.      The femoral sizing guide is placed and size 5 is most appropriate. Rotation is marked off the epicondylar axis and confirmed by creating a rectangular flexion gap at 90 degrees. The size 5 cutting block is pinned in this rotation and the anterior, posterior and chamfer cuts are made with the oscillating saw. The intercondylar block is then placed and that cut is made.  Trial size 4 tibial component, trial size 5 posterior stabilized femur and a 10  mm posterior stabilized rotating platform insert trial is placed. Full extension is achieved with excellent varus/valgus and anterior/posterior balance throughout full range of motion. The patella is everted and thickness measured to be 22  mm. Free hand resection is taken to 12 mm, a 35 template is placed, lug  holes are drilled, trial patella is placed, and it tracks normally. Osteophytes are removed off the posterior femur with the trial in place. All trials are removed and the cut bone surfaces prepared with pulsatile lavage. Cement is mixed and once ready for implantation, the size 4 tibial implant, size  5 posterior stabilized femoral component, and the size 35 patella are cemented in place and the patella is held with the clamp. The trial insert is placed and the knee held in full extension. The Exparel (20 ml mixed with 60 ml saline) is injected into the extensor mechanism, posterior capsule, medial and lateral gutters and subcutaneous tissues.  All extruded cement is removed and once the cement is hard the permanent 10 mm posterior stabilized rotating platform insert is placed into the tibial tray.      The wound is copiously irrigated with saline solution and the extensor mechanism closed with # 0 Stratofix suture. The tourniquet is released for a total tourniquet time of 35  minutes. Flexion against gravity is 140 degrees and the patella tracks normally. Subcutaneous tissue is closed with 2.0 vicryl and subcuticular with running 4.0 Monocryl. The incision is cleaned and dried and steri-strips and a bulky sterile dressing are applied. The limb is placed into a knee immobilizer and the patient is awakened and transported to recovery in stable condition.      Please note that a surgical assistant was a medical necessity for this procedure in order to perform it in a safe and expeditious manner. Surgical assistant was necessary to retract the ligaments and vital neurovascular structures to prevent injury to them and also necessary for proper positioning of the limb to allow for anatomic placement of the prosthesis.   Dione Plover [name] [name], MD    03/31/2022, 9:15 AM

## 2022-03-31 NOTE — Transfer of Care (Signed)
Immediate Anesthesia Transfer of Care Note  Patient: Colleen Zuniga  Procedure(s) Performed: TOTAL KNEE ARTHROPLASTY (Right: Knee)  Patient Location: PACU  Anesthesia Type:General  Level of Consciousness: awake, alert , and oriented  Airway & Oxygen Therapy: Patient Spontanous Breathing and Patient connected to face mask oxygen  Post-op Assessment: Report given to RN and Post -op Vital signs reviewed and stable  Post vital signs: Reviewed and stable  Last Vitals:  Vitals Value Taken Time  BP 143/74 03/31/22 0939  Temp    Pulse 65 03/31/22 0940  Resp 14 03/31/22 0940  SpO2 100 % 03/31/22 0940  Vitals shown include unvalidated device data.  Last Pain:  Vitals:   03/31/22 0626  TempSrc:   PainSc: 5       Patients Stated Pain Goal: 4 (85/46/27 0350)  Complications: No notable events documented.

## 2022-03-31 NOTE — Progress Notes (Signed)
Patient's blood sugar is 236 per her Dexcom CGM G6. Dexcom administered 2 units insulin.

## 2022-03-31 NOTE — Evaluation (Signed)
Physical Therapy Evaluation Patient Details Name: Colleen Zuniga MRN: [id number] DOB: [birth year] Today's Date: 03/31/2022  History of Present Illness  Pt is a 62 y.o. female s/p R TKA .  PMH: L TKA 2023, anemia, anxiety, depression, DM, gerd, hypothyroidism, lumbar fusion (9323,5573), and  anterior cervical discectomy (2017).  Clinical Impression  Pt is s/p TKA resulting in the deficits listed below (see PT Problem List).  C/o 10/10 pain but agreeable to mobility;  assisted to EOB and with transfer to chair; deferred amb d/t decr quad activation/R knee buckling with WBing with KI in place. Pt is motivated to continue working  with therapy.  Pt will benefit from skilled PT to increase their independence and safety with mobility to allow discharge to the venue listed below.         Recommendations for follow up therapy are one component of a multi-disciplinary discharge planning process, led by the attending physician.  Recommendations may be updated based on patient status, additional functional criteria and insurance authorization.  Follow Up Recommendations Follow physician's recommendations for discharge plan and follow up therapies      Assistance Recommended at Discharge Intermittent Supervision/Assistance  Patient can return home with the following  A little help with walking and/or transfers;A little help with bathing/dressing/bathroom;Assist for transportation;Help with stairs or ramp for entrance;Assistance with cooking/housework    Equipment Recommendations None recommended by PT  Recommendations for Other Services       Functional Status Assessment Patient has had a recent decline in their functional status and demonstrates the ability to make significant improvements in function in a reasonable and predictable amount of time.     Precautions / Restrictions Precautions Precautions: Fall;Knee Required Braces or Orthoses: Knee Immobilizer - Right Knee Immobilizer -  Right: Discontinue once straight leg raise with < 10 degree lag Restrictions Weight Bearing Restrictions: No Other Position/Activity Restrictions: WBAT      Mobility  Bed Mobility Overal bed mobility: Needs Assistance Bed Mobility: Supine to Sit     Supine to sit: Min assist     General bed mobility comments: assist with RLE    Transfers Overall transfer level: Needs assistance Equipment used: Rolling walker (2 wheels) Transfers: Sit to/from Stand, Bed to chair/wheelchair/BSC Sit to Stand: Min assist   Step pivot transfers: Mod assist       General transfer comment: cues for safety and hand placement, assist to rise and stabilize; pt with  R knee buckling with KI in place during transfer;    Ambulation/Gait               General Gait Details: deferred/unsafe  Stairs            Wheelchair Mobility    Modified Rankin (Stroke Patients Only)       Balance                                             Pertinent Vitals/Pain Pain Assessment Pain Assessment: 0-10 Pain Score: 10-Worst pain ever Pain Location: right knee Pain Descriptors / Indicators: Grimacing, Guarding, Aching, Sore Pain Intervention(s): Limited activity within patient's tolerance, Monitored during session, Premedicated before session, Repositioned, Ice applied    Home Living Family/patient expects to be discharged to:: Private residence Living Arrangements: Spouse/significant other;Children;Other relatives Available Help at Discharge: Family;Friend(s);Available 24 hours/day Type of Home: House Home Access: Level entry  Home Layout: One level Home Equipment: Conservation officer, nature (2 wheels);Cane - single point;BSC/3in1;Grab bars - toilet;Grab bars - tub/shower Additional Comments: husband has dementia    Prior Function Prior Level of Function : Independent/Modified Independent;Driving                     Hand Dominance        Extremity/Trunk  Assessment   Upper Extremity Assessment Upper Extremity Assessment: Overall WFL for tasks assessed    Lower Extremity Assessment Lower Extremity Assessment: RLE deficits/detail RLE Deficits / Details: ankle WFL, ~ 20 degree quad lag with SLR RLE: Unable to fully assess due to pain       Communication   Communication: No difficulties  Cognition Arousal/Alertness: Awake/alert Behavior During Therapy: WFL for tasks assessed/performed Overall Cognitive Status: Within Functional Limits for tasks assessed                                          General Comments      Exercises Total Joint Exercises Ankle Circles/Pumps: AROM, Both, 5 reps   Assessment/Plan    PT Assessment Patient needs continued PT services  PT Problem List Decreased strength;Decreased range of motion;Decreased activity tolerance;Decreased balance;Decreased mobility;Decreased knowledge of precautions;Decreased knowledge of use of DME;Pain       PT Treatment Interventions DME instruction;Therapeutic exercise;Gait training;Stair training;Functional mobility training;Therapeutic activities;Patient/family education    PT Goals (Current goals can be found in the Care Plan section)  Acute Rehab PT Goals PT Goal Formulation: With patient Time For Goal Achievement: 04/07/22 Potential to Achieve Goals: Good    Frequency 7X/week     Co-evaluation               AM-PAC PT "6 Clicks" Mobility  Outcome Measure Help needed turning from your back to your side while in a flat bed without using bedrails?: A Little Help needed moving from lying on your back to sitting on the side of a flat bed without using bedrails?: A Little Help needed moving to and from a bed to a chair (including a wheelchair)?: A Lot Help needed standing up from a chair using your arms (e.g., wheelchair or bedside chair)?: A Lot Help needed to walk in hospital room?: Total Help needed climbing 3-5 steps with a railing? :  Total 6 Click Score: 12    End of Session Equipment Utilized During Treatment: Gait belt;Right knee immobilizer Activity Tolerance: Patient tolerated treatment well;Patient limited by pain Patient left: in chair;with call bell/phone within reach;with chair alarm set;with family/visitor present Nurse Communication: Mobility status PT Visit Diagnosis: Other abnormalities of gait and mobility (R26.89);Difficulty in walking, not elsewhere classified (R26.2)    Time: 7341-9379 PT Time Calculation (min) (ACUTE ONLY): 25 min   Charges:   PT Evaluation $PT Eval Low Complexity: 1 Low PT Treatments $Therapeutic Activity: 8-22 mins        Baxter Flattery, PT  Acute Rehab Dept [facility]) [phone]  [facility] Weekend Pager [facility] only)  [phone]  03/31/2022   [facility] 03/31/2022, 4:24 PM

## 2022-03-31 NOTE — Care Plan (Signed)
Ortho Bundle Case Management Note  Patient Details  Name: Colleen Zuniga MRN: [id number] Date of Birth: Sep 05, 1960  R TKA on 03-31-22 DCP:  home with husband who has dementia.  Sister and neighbor will also be assisting.  DME:  No needs, has a RW PT:  [facility] on 04-03-22                   DME Arranged:  N/A DME Agency:  NA  HH Arranged:  NA HH Agency:  NA  Additional Comments: Please contact me with any questions of if this plan should need to change.  Marianne Sofia, RN,CCM [facility]  [phone] 03/31/2022, 5:27 PM

## 2022-03-31 NOTE — Anesthesia Procedure Notes (Signed)
Anesthesia Regional Block: Adductor canal block   Pre-Anesthetic Checklist: , timeout performed,  Correct Patient, Correct Site, Correct Laterality,  Correct Procedure, Correct Position, site marked,  Risks and benefits discussed,  Surgical consent,  Pre-op evaluation,  At surgeon's request and post-op pain management  Laterality: Right  Prep: chloraprep       Needles:  Injection technique: Single-shot  Needle Type: Echogenic Needle     Needle Length: 9cm  Needle Gauge: 21     Additional Needles:   Narrative:  Start time: 03/31/2022 7:45 AM End time: 03/31/2022 7:51 AM Injection made incrementally with aspirations every 5 mL.  Performed by: Personally  Anesthesiologist: Albertha Ghee, MD  Additional Notes: Pt tolerated the procedure well.

## 2022-03-31 NOTE — Anesthesia Preprocedure Evaluation (Signed)
Anesthesia Evaluation  Patient identified by MRN, date of birth, ID band Patient awake    Reviewed: Allergy & Precautions, H&P , NPO status , Patient's Chart, lab work & pertinent test results  History of Anesthesia Complications (+) PONV and history of anesthetic complications  Airway Mallampati: II   Neck ROM: full    Dental   Pulmonary former smoker   breath sounds clear to auscultation       Cardiovascular negative cardio ROS  Rhythm:regular Rate:Normal     Neuro/Psych  PSYCHIATRIC DISORDERS Anxiety Depression       GI/Hepatic ,GERD  ,,  Endo/Other  diabetes, Type 2Hypothyroidism    Renal/GU      Musculoskeletal  (+) Arthritis ,    Abdominal   Peds  Hematology   Anesthesia Other Findings   Reproductive/Obstetrics                             Anesthesia Physical Anesthesia Plan  ASA: 2  Anesthesia Plan: General   Post-op Pain Management: Regional block*   Induction: Intravenous  PONV Risk Score and Plan: 4 or greater and Ondansetron, Propofol infusion, TIVA, Midazolam, Treatment may vary due to age or medical condition and Dexamethasone  Airway Management Planned: LMA  Additional Equipment:   Intra-op Plan:   Post-operative Plan: Extubation in OR  Informed Consent: I have reviewed the patients History and Physical, chart, labs and discussed the procedure including the risks, benefits and alternatives for the proposed anesthesia with the patient or authorized representative who has indicated his/her understanding and acceptance.     Dental advisory given  Plan Discussed with: CRNA, Anesthesiologist and Surgeon  Anesthesia Plan Comments:        Anesthesia Quick Evaluation

## 2022-03-31 NOTE — Discharge Instructions (Addendum)
 Frank Aluisio, MD Total Joint Specialist [facility] [place] [address] [place], [place] [address] [phone]  TOTAL KNEE REPLACEMENT POSTOPERATIVE DIRECTIONS    Knee Rehabilitation, Guidelines Following Surgery  Results after knee surgery are often greatly improved when you follow the exercise, range of motion and muscle strengthening exercises prescribed by your doctor. Safety measures are also important to protect the knee from further injury. If any of these exercises cause you to have increased pain or swelling in your knee joint, decrease the amount until you are comfortable again and slowly increase them. If you have problems or questions, call your caregiver or physical therapist for advice.   BLOOD CLOT PREVENTION Take a 325 mg Aspirin two times a day for three weeks following surgery. Then take an 81 mg Aspirin once a day for three weeks. Then discontinue Aspirin. You may resume your vitamins/supplements upon discharge from the hospital. Do not take any NSAIDs (Advil, Aleve, Ibuprofen, Meloxicam, etc.) until you have discontinued the 325 mg Aspirin.  HOME CARE INSTRUCTIONS  Remove items at home which could result in a fall. This includes throw rugs or furniture in walking pathways.  ICE to the affected knee as much as tolerated. Icing helps control swelling. If the swelling is well controlled you will be more comfortable and rehab easier. Continue to use ice on the knee for pain and swelling from surgery. You may notice swelling that will progress down to the foot and ankle. This is normal after surgery. Elevate the leg when you are not up walking on it.    Continue to use the breathing machine which will help keep your temperature down. It is common for your temperature to cycle up and down following surgery, especially at night when you are not up moving around and exerting yourself. The breathing machine keeps your lungs expanded and your temperature  down. Do not place pillow under the operative knee, focus on keeping the knee straight while resting  DIET You may resume your previous home diet once you are discharged from the hospital.  DRESSING / WOUND CARE / SHOWERING Keep your bulky bandage on for 2 days. On the third post-operative day you may remove the Ace bandage and gauze. There is a waterproof adhesive bandage on your skin which will stay in place until your first follow-up appointment. Once you remove this you will not need to place another bandage You may begin showering 3 days following surgery, but do not submerge the incision under water.  ACTIVITY For the first 5 days, the key is rest and control of pain and swelling Do your home exercises twice a day starting on post-operative day 3. On the days you go to physical therapy, just do the home exercises once that day. You should rest, ice and elevate the leg for 50 minutes out of every hour. Get up and walk/stretch for 10 minutes per hour. After 5 days you can increase your activity slowly as tolerated. Walk with your walker as instructed. Use the walker until you are comfortable transitioning to a cane. Walk with the cane in the opposite hand of the operative leg. You may discontinue the cane once you are comfortable and walking steadily. Avoid periods of inactivity such as sitting longer than an hour when not asleep. This helps prevent blood clots.  You may discontinue the knee immobilizer once you are able to perform a straight leg raise while lying down. You may resume a sexual relationship in one month   or when given the OK by your doctor.  You may return to work once you are cleared by your doctor.  Do not drive a car for 6 weeks or until released by your surgeon.  Do not drive while taking narcotics.  TED HOSE STOCKINGS Wear the elastic stockings on both legs for three weeks following surgery during the day. You may remove them at night for sleeping.  WEIGHT  BEARING Weight bearing as tolerated with assist device (walker, cane, etc) as directed, use it as long as suggested by your surgeon or therapist, typically at least 4-6 weeks.  POSTOPERATIVE CONSTIPATION PROTOCOL Constipation - defined medically as fewer than three stools per week and severe constipation as less than one stool per week.  One of the most common issues patients have following surgery is constipation.  Even if you have a regular bowel pattern at home, your normal regimen is likely to be disrupted due to multiple reasons following surgery.  Combination of anesthesia, postoperative narcotics, change in appetite and fluid intake all can affect your bowels.  In order to avoid complications following surgery, here are some recommendations in order to help you during your recovery period.  Colace (docusate) - Pick up an over-the-counter form of Colace or another stool softener and take twice a day as long as you are requiring postoperative pain medications.  Take with a full glass of water daily.  If you experience loose stools or diarrhea, hold the colace until you stool forms back up. If your symptoms do not get better within 1 week or if they get worse, check with your doctor. Dulcolax (bisacodyl) - Pick up over-the-counter and take as directed by the product packaging as needed to assist with the movement of your bowels.  Take with a full glass of water.  Use this product as needed if not relieved by Colace only.  MiraLax (polyethylene glycol) - Pick up over-the-counter to have on hand. MiraLax is a solution that will increase the amount of water in your bowels to assist with bowel movements.  Take as directed and can mix with a glass of water, juice, soda, coffee, or tea. Take if you go more than two days without a movement. Do not use MiraLax more than once per day. Call your doctor if you are still constipated or irregular after using this medication for 7 days in a row.  If you continue  to have problems with postoperative constipation, please contact the office for further assistance and recommendations.  If you experience "the worst abdominal pain ever" or develop nausea or vomiting, please contact the office immediatly for further recommendations for treatment.  ITCHING If you experience itching with your medications, try taking only a single pain pill, or even half a pain pill at a time.  You can also use Benadryl over the counter for itching or also to help with sleep.   MEDICATIONS See your medication summary on the "After Visit Summary" that the nursing staff will review with you prior to discharge.  You may have some home medications which will be placed on hold until you complete the course of blood thinner medication.  It is important for you to complete the blood thinner medication as prescribed by your surgeon.  Continue your approved medications as instructed at time of discharge.  PRECAUTIONS If you experience chest pain or shortness of breath - call 911 immediately for transfer to the hospital emergency department.  If you develop a fever greater that 101 F,   purulent drainage from wound, increased redness or drainage from wound, foul odor from the wound/dressing, or calf pain - CONTACT YOUR SURGEON.                                                   FOLLOW-UP APPOINTMENTS Make sure you keep all of your appointments after your operation with your surgeon and caregivers. You should call the office at the above phone number and make an appointment for approximately two weeks after the date of your surgery or on the date instructed by your surgeon outlined in the "After Visit Summary".  RANGE OF MOTION AND STRENGTHENING EXERCISES  Rehabilitation of the knee is important following a knee injury or an operation. After just a few days of immobilization, the muscles of the thigh which control the knee become weakened and shrink (atrophy). Knee exercises are designed to build up  the tone and strength of the thigh muscles and to improve knee motion. Often times heat used for twenty to thirty minutes before working out will loosen up your tissues and help with improving the range of motion but do not use heat for the first two weeks following surgery. These exercises can be done on a training (exercise) mat, on the floor, on a table or on a bed. Use what ever works the best and is most comfortable for you Knee exercises include:  Leg Lifts - While your knee is still immobilized in a splint or cast, you can do straight leg raises. Lift the leg to 60 degrees, hold for 3 sec, and slowly lower the leg. Repeat 10-20 times 2-3 times daily. Perform this exercise against resistance later as your knee gets better.  Quad and Hamstring Sets - Tighten up the muscle on the front of the thigh (Quad) and hold for 5-10 sec. Repeat this 10-20 times hourly. Hamstring sets are done by pushing the foot backward against an object and holding for 5-10 sec. Repeat as with quad sets.  Leg Slides: Lying on your back, slowly slide your foot toward your buttocks, bending your knee up off the floor (only go as far as is comfortable). Then slowly slide your foot back down until your leg is flat on the floor again. Angel Wings: Lying on your back spread your legs to the side as far apart as you can without causing discomfort.  A rehabilitation program following serious knee injuries can speed recovery and prevent re-injury in the future due to weakened muscles. Contact your doctor or a physical therapist for more information on knee rehabilitation.   POST-OPERATIVE OPIOID TAPER INSTRUCTIONS: It is important to wean off of your opioid medication as soon as possible. If you do not need pain medication after your surgery it is ok to stop day one. Opioids include: Codeine, Hydrocodone(Norco, Vicodin), Oxycodone(Percocet, oxycontin) and hydromorphone amongst others.  Long term and even short term use of opiods can  cause: Increased pain response Dependence Constipation Depression Respiratory depression And more.  Withdrawal symptoms can include Flu like symptoms Nausea, vomiting And more Techniques to manage these symptoms Hydrate well Eat regular healthy meals Stay active Use relaxation techniques(deep breathing, meditating, yoga) Do Not substitute Alcohol to help with tapering If you have been on opioids for less than two weeks and do not have pain than it is ok to stop all together.  Plan   to wean off of opioids This plan should start within one week post op of your joint replacement. Maintain the same interval or time between taking each dose and first decrease the dose.  Cut the total daily intake of opioids by one tablet each day Next start to increase the time between doses. The last dose that should be eliminated is the evening dose.   IF YOU ARE TRANSFERRED TO A SKILLED REHAB FACILITY If the patient is transferred to a skilled rehab facility following release from the hospital, a list of the current medications will be sent to the facility for the patient to continue.  When discharged from the skilled rehab facility, please have the facility set up the patient's Home Health Physical Therapy prior to being released. Also, the skilled facility will be responsible for providing the patient with their medications at time of release from the facility to include their pain medication, the muscle relaxants, and their blood thinner medication. If the patient is still at the rehab facility at time of the two week follow up appointment, the skilled rehab facility will also need to assist the patient in arranging follow up appointment in our office and any transportation needs.  MAKE SURE YOU:  Understand these instructions.  Get help right away if you are not doing well or get worse.   DENTAL ANTIBIOTICS:  In most cases prophylactic antibiotics for Dental procdeures after total joint surgery are  not necessary.  Exceptions are as follows:  1. History of prior total joint infection  2. Severely immunocompromised (Organ Transplant, cancer chemotherapy, Rheumatoid biologic meds such as Humera)  3. Poorly controlled diabetes (A1C &gt; 8.0, blood glucose over 200)  If you have one of these conditions, contact your surgeon for an antibiotic prescription, prior to your dental procedure.    Pick up stool softner and laxative for home use following surgery while on pain medications. Do not submerge incision under water. Please use good hand washing techniques while changing dressing each day. May shower starting three days after surgery. Please use a clean towel to pat the incision dry following showers. Continue to use ice for pain and swelling after surgery. Do not use any lotions or creams on the incision until instructed by your surgeon.  

## 2022-03-31 NOTE — Inpatient Diabetes Management (Signed)
Inpatient Diabetes Program Recommendations  AACE/ADA: New Consensus Statement on Inpatient Glycemic Control (2015)  Target Ranges:  Prepandial:   less than 140 mg/dL      Peak postprandial:   less than 180 mg/dL (1-2 hours)      Critically ill patients:  140 - 180 mg/dL   Lab Results  Component Value Date   GLUCAP 178 (H) 03/31/2022   HGBA1C 5.9 (H) 03/20/2022    Review of Glycemic Control  Diabetes history: DM1 Outpatient Diabetes medications: Tandem T Slim x2 insulin pump, mounjaro 5 mg q week Current orders for Inpatient glycemic control: Insulin pump, Novolog 0-15 units tid correction scale  Inpatient Diabetes Program Recommendations:   RN states patient does have her insulin pump infusing. Patient is covering correction and meal coverage with insulin pump -D/C of Novolog correction  orders.  Spoke with patient via phone (DM coordinator working remotely). Patient sees Dr. Dagmar Hait for diabetes management and Dr. Oren Section for education and insulin adjustment. Patient is well versed with her insulin pump and has supplies she needs with her. Patient aware she received Decadron 8 mg this am prior to surgery.  Thank you, Nani Gasser. Kenyonna Micek, RN, MSN, CDE  Diabetes Coordinator Inpatient Glycemic Control Team Team Pager [phone] (8am-5pm) 03/31/2022 4:36 PM

## 2022-04-01 ENCOUNTER — Encounter (HOSPITAL_COMMUNITY): Payer: Self-pay | Admitting: Orthopedic Surgery

## 2022-04-01 DIAGNOSIS — M1711 Unilateral primary osteoarthritis, right knee: Secondary | ICD-10-CM | POA: Diagnosis not present

## 2022-04-01 LAB — CBC
HCT: 29.9 % — ABNORMAL LOW (ref 36.0–46.0)
Hemoglobin: 10 g/dL — ABNORMAL LOW (ref 12.0–15.0)
MCH: 32.9 pg (ref 26.0–34.0)
MCHC: 33.4 g/dL (ref 30.0–36.0)
MCV: 98.4 fL (ref 80.0–100.0)
Platelets: 190 10*3/uL (ref 150–400)
RBC: 3.04 MIL/uL — ABNORMAL LOW (ref 3.87–5.11)
RDW: 11.4 % — ABNORMAL LOW (ref 11.5–15.5)
WBC: 7.2 10*3/uL (ref 4.0–10.5)
nRBC: 0 % (ref 0.0–0.2)

## 2022-04-01 LAB — BASIC METABOLIC PANEL
Anion gap: 6 (ref 5–15)
BUN: 13 mg/dL (ref 8–23)
CO2: 25 mmol/L (ref 22–32)
Calcium: 9.1 mg/dL (ref 8.9–10.3)
Chloride: 103 mmol/L (ref 98–111)
Creatinine, Ser: 0.76 mg/dL (ref 0.44–1.00)
GFR, Estimated: >60 mL/min (ref >60)
Glucose, Bld: 204 mg/dL — ABNORMAL HIGH (ref 70–99)
Potassium: 4.4 mmol/L (ref 3.5–5.1)
Sodium: 134 mmol/L — ABNORMAL LOW (ref 135–145)

## 2022-04-01 MED ORDER — METHOCARBAMOL 500 MG PO TABS: 500.0000 mg | ORAL_TABLET | Freq: Four times a day (QID) | ORAL | 0 refills | Status: AC | PRN

## 2022-04-01 MED ORDER — ASPIRIN 325 MG PO TBEC: 325.0000 mg | DELAYED_RELEASE_TABLET | Freq: Two times a day (BID) | ORAL | 0 refills | Status: AC

## 2022-04-01 MED ORDER — OXYCODONE HCL 5 MG PO TABS: 5.0000 mg | ORAL_TABLET | Freq: Four times a day (QID) | ORAL | 0 refills | Status: DC | PRN

## 2022-04-01 MED ORDER — TRAMADOL HCL 50 MG PO TABS: 50.0000 mg | ORAL_TABLET | Freq: Four times a day (QID) | ORAL | 0 refills | Status: AC | PRN

## 2022-04-01 NOTE — Discharge Summary (Signed)
Physician Discharge Summary   Patient ID: Colleen Zuniga MRN: [id number] DOB/AGE: 1960/12/27 62 y.o.  Admit date: 03/31/2022 Discharge date: 04/01/2022  Primary Diagnosis: Osteoarthritis, right knee   Admission Diagnoses:  Past Medical History:  Diagnosis Date   Anemia    Anxiety    Arthritis    Back pain    Back pain    De Quervain's tenosynovitis, right 01/2012   Degenerative disc disease, cervical    states neck is stiff, reduced range of motion   Dental crowns present    Depression    Diabetes mellitus    Insulin pump   GERD (gastroesophageal reflux disease)    Heart murmur    states has a functional murmur, and that she has never had any problems   Hypothyroidism    Joint pain    Osteoarthritis    PONV (postoperative nausea and vomiting)    Discharge Diagnoses:   Principal Problem:   Osteoarthritis of right knee Active Problems:   Primary osteoarthritis of right knee  Estimated body mass index is 30 kg/m as calculated from the following:   Height as of this encounter: 5\' 2"  (1.575 m).   Weight as of this encounter: 74.4 kg.  Procedure:  Procedure(s) (LRB): TOTAL KNEE ARTHROPLASTY (Right)   Consults: None  HPI: Colleen Zuniga is a 62 y.o. year old female with end stage OA of her right knee with progressively worsening pain and dysfunction. She has constant pain, with activity and at rest and significant functional deficits with difficulties even with ADLs. She has had extensive non-op management including analgesics, injections of cortisone and viscosupplements, and home exercise program, but remains in significant pain with significant dysfunction.Radiographs show bone on bone arthritis medial and patellofemoral. She presents now for right Total Knee Arthroplasty.  Laboratory Data: Admission on 03/31/2022, Discharged on 04/01/2022  Component Date Value Ref Range Status   Glucose-Capillary 03/31/2022 215 (H)  70 - 99 mg/dL Final   Glucose reference  range applies only to samples taken after fasting for at least 8 hours.   Glucose-Capillary 03/31/2022 178 (H)  70 - 99 mg/dL Final   Glucose reference range applies only to samples taken after fasting for at least 8 hours.   WBC 04/01/2022 7.2  4.0 - 10.5 K/uL Final   RBC 04/01/2022 3.04 (L)  3.87 - 5.11 MIL/uL Final   Hemoglobin 04/01/2022 10.0 (L)  12.0 - 15.0 g/dL Final   HCT 04/03/2022 29.9 (L)  36.0 - 46.0 % Final   MCV 04/01/2022 98.4  80.0 - 100.0 fL Final   MCH 04/01/2022 32.9  26.0 - 34.0 pg Final   MCHC 04/01/2022 33.4  30.0 - 36.0 g/dL Final   RDW 04/03/2022 11.4 (L)  11.5 - 15.5 % Final   Platelets 04/01/2022 190  150 - 400 K/uL Final   nRBC 04/01/2022 0.0  0.0 - 0.2 % Final   Performed at [facility], [address], [place], [place]   Sodium 04/01/2022 134 (L)  135 - 145 mmol/L Final   Potassium 04/01/2022 4.4  3.5 - 5.1 mmol/L Final   Chloride 04/01/2022 103  98 - 111 mmol/L Final   CO2 04/01/2022 25  22 - 32 mmol/L Final   Glucose, Bld 04/01/2022 204 (H)  70 - 99 mg/dL Final   Glucose reference range applies only to samples taken after fasting for at least 8 hours.   BUN 04/01/2022 13  8 - 23 mg/dL Final   Creatinine,  Ser 04/01/2022 0.76  0.44 - 1.00 mg/dL Final   Calcium 04/01/2022 9.1  8.9 - 10.3 mg/dL Final   GFR, Estimated 04/01/2022 >60  >60 mL/min Final   Comment: (NOTE) Calculated using the CKD-EPI Creatinine Equation (2021)    Anion gap 04/01/2022 6  5 - 15 Final   Performed at [facility], [place] [address] [place], [place] [address]  Hospital Outpatient Visit on 03/20/2022  Component Date Value Ref Range Status   MRSA, PCR 03/20/2022 NEGATIVE  NEGATIVE Final   Staphylococcus aureus 03/20/2022 NEGATIVE  NEGATIVE Final   Comment: (NOTE) The Xpert SA Assay (FDA approved for NASAL specimens in patients 67 years of age and older), is one component of a comprehensive surveillance program. It is not intended to  diagnose infection nor to guide or monitor treatment. Performed at [facility], [place] [address] [place], [place] [address]    Hgb A1c MFr Bld 03/20/2022 5.9 (H)  4.8 - 5.6 % Final   Comment: (NOTE) Pre diabetes:          5.7%-6.4%  Diabetes:              >6.4%  Glycemic control for   <7.0% adults with diabetes    Mean Plasma Glucose 03/20/2022 122.63  mg/dL Final   Performed at [facility], [place] [address] [place], [place] [address]   Sodium 03/20/2022 135  135 - 145 mmol/L Final   Potassium 03/20/2022 5.2 (H)  3.5 - 5.1 mmol/L Final   Chloride 03/20/2022 102  98 - 111 mmol/L Final   CO2 03/20/2022 25  22 - 32 mmol/L Final   Glucose, Bld 03/20/2022 157 (H)  70 - 99 mg/dL Final   Glucose reference range applies only to samples taken after fasting for at least 8 hours.   BUN 03/20/2022 17  8 - 23 mg/dL Final   Creatinine, Ser 03/20/2022 0.98  0.44 - 1.00 mg/dL Final   Calcium 03/20/2022 9.9  8.9 - 10.3 mg/dL Final   GFR, Estimated 03/20/2022 >60  >60 mL/min Final   Comment: (NOTE) Calculated using the CKD-EPI Creatinine Equation (2021)    Anion gap 03/20/2022 8  5 - 15 Final   Performed at [facility], [place] [address], [place], [place] [address]   WBC 03/20/2022 5.9  4.0 - 10.5 K/uL Final   RBC 03/20/2022 3.82 (L)  3.87 - 5.11 MIL/uL Final   Hemoglobin 03/20/2022 12.5  12.0 - 15.0 g/dL Final   HCT 03/20/2022 38.6  36.0 - 46.0 % Final   MCV 03/20/2022 101.0 (H)  80.0 - 100.0 fL Final   MCH 03/20/2022 32.7  26.0 - 34.0 pg Final   MCHC 03/20/2022 32.4  30.0 - 36.0 g/dL Final   RDW 03/20/2022 11.5  11.5 - 15.5 % Final   Platelets 03/20/2022 226  150 - 400 K/uL Final   nRBC 03/20/2022 0.0  0.0 - 0.2 % Final   Performed at [facility], [place] [address] [place], [place] [address]     X-Rays:No results found.  EKG: Orders placed or performed during the hospital encounter of 12/10/21   EKG 12 lead per protocol    EKG 12 lead per protocol     Hospital Course: Colleen Zuniga is a 62 y.o. who was admitted to [facility]. They were brought to the operating room on 03/31/2022 and underwent Procedure(s): TOTAL KNEE ARTHROPLASTY.  Patient tolerated the procedure well and was  later transferred to the recovery room and then to the orthopaedic floor for postoperative care. They were given PO and IV analgesics for pain control following their surgery. They were given 24 hours of postoperative antibiotics of  Anti-infectives (From admission, onward)    Start     Dose/Rate Route Frequency Ordered Stop   03/31/22 2000  vancomycin (VANCOCIN) IVPB 1000 mg/200 mL premix        1,000 mg 200 mL/hr over 60 Minutes Intravenous Every 12 hours 03/31/22 1032 04/01/22 1216   03/31/22 0600  vancomycin (VANCOCIN) IVPB 1000 mg/200 mL premix        1,000 mg 200 mL/hr over 60 Minutes Intravenous On call to O.R. 03/31/22 1324 03/31/22 0906      and started on DVT prophylaxis in the form of Aspirin.   PT and OT were ordered for total joint protocol. Discharge planning consulted to help with postop disposition and equipment needs.  Patient had a fair night on the evening of surgery. They started to get up OOB with therapy on POD #0. Pt was seen during rounds and was ready to go home pending progress with therapy. She worked with therapy on POD #1 and was meeting her goals. Pt was discharged to home later that day in stable condition.  Diet: Diabetic diet Activity: WBAT Follow-up: in 2 weeks Disposition: Home Discharged Condition: stable   Discharge Instructions     Call MD / Call 911   Complete by: As directed    If you experience chest pain or shortness of breath, CALL 911 and be transported to the hospital emergency room.  If you develope a fever above 101 F, pus (white drainage) or increased drainage or redness at the wound, or calf pain, call your surgeon's office.   Change dressing   Complete by: As directed     You may remove the bulky bandage (ACE wrap and gauze) two days after surgery. You will have an adhesive waterproof bandage underneath. Leave this in place until your first follow-up appointment.   Constipation Prevention   Complete by: As directed    Drink plenty of fluids.  Prune juice may be helpful.  You may use a stool softener, such as Colace (over the counter) 100 mg twice a day.  Use MiraLax (over the counter) for constipation as needed.   Diet - low sodium heart healthy   Complete by: As directed    Do not put a pillow under the knee. Place it under the heel.   Complete by: As directed    Driving restrictions   Complete by: As directed    No driving for two weeks   Post-operative opioid taper instructions:   Complete by: As directed    POST-OPERATIVE OPIOID TAPER INSTRUCTIONS: It is important to wean off of your opioid medication as soon as possible. If you do not need pain medication after your surgery it is ok to stop day one. Opioids include: Codeine, Hydrocodone(Norco, Vicodin), Oxycodone(Percocet, oxycontin) and hydromorphone amongst others.  Long term and even short term use of opiods can cause: Increased pain response Dependence Constipation Depression Respiratory depression And more.  Withdrawal symptoms can include Flu like symptoms Nausea, vomiting And more Techniques to manage these symptoms Hydrate well Eat regular healthy meals Stay active Use relaxation techniques(deep breathing, meditating, yoga) Do Not substitute Alcohol to help with tapering If you have been on opioids for less than two weeks and do not have pain than it is ok to stop all  together.  Plan to wean off of opioids This plan should start within one week post op of your joint replacement. Maintain the same interval or time between taking each dose and first decrease the dose.  Cut the total daily intake of opioids by one tablet each day Next start to increase the time between doses. The  last dose that should be eliminated is the evening dose.      TED hose   Complete by: As directed    Use stockings (TED hose) for three weeks on both leg(s).  You may remove them at night for sleeping.   Weight bearing as tolerated   Complete by: As directed       Allergies as of 04/01/2022       Reactions   Penicillins Anaphylaxis, Other (See Comments)   CLOSES THROAT PATIENT HAD A PCN REACTION WITH IMMEDIATE RASH, FACIAL/TONGUE/THROAT SWELLING, SOB, OR LIGHTHEADEDNESS WITH HYPOTENSION:  #  #  #  YES  #  #  #   Has patient had a PCN reaction causing severe rash involving mucus membranes or skin necrosis: UNKNOWN  Has patient had a PCN reaction that required hospitalization:No Has patient had a PCN reaction occurring within the last 10 years:No   Cefdinir Diarrhea, Nausea And Vomiting   Hydrocodone-acetaminophen Nausea And Vomiting   Nausea/vomiting with or without food   Oxycodone Hcl Nausea Only   Adhesive [tape] Itching, Rash        Medication List     TAKE these medications    acetaminophen 500 MG tablet Commonly known as: TYLENOL Take 1,000 mg by mouth in the morning, at noon, and at bedtime.   ADULT GUMMY PO Take 2 tablets by mouth daily.   AIRBORNE PO Take 2 tablets by mouth daily.   albuterol 108 (90 Base) MCG/ACT inhaler Commonly known as: VENTOLIN HFA Inhale 2 puffs into the lungs every 4 (four) hours as needed for shortness of breath or wheezing.   ALPRAZolam 0.5 MG tablet Commonly known as: XANAX Take 0.5 mg by mouth every 6 (six) hours as needed for sleep or anxiety.   aspirin EC 325 MG tablet Take 1 tablet (325 mg total) by mouth 2 (two) times daily for 20 days. Then take one 81 mg aspirin once a day for three weeks. Then discontinue aspirin.   atorvastatin 10 MG tablet Commonly known as: LIPITOR Take 10 mg by mouth daily after supper.   Baqsimi Two Pack 3 MG/DOSE Powd Generic drug: Glucagon Place 3 mg into the nose as needed (hypoglycemia).    buPROPion 300 MG 24 hr tablet Commonly known as: WELLBUTRIN XL Take 300 mg by mouth daily after breakfast.   CALCIUM 500 + D PO Take 2 tablets by mouth 2 (two) times daily.   Continuous Glucose Monitor Devi 1 Device by Other route continuous.   diclofenac Sodium 1 % Gel Commonly known as: VOLTAREN Apply 2 g topically daily as needed (pain).   docusate sodium 100 MG capsule Commonly known as: COLACE Take 100 mg by mouth every evening.   escitalopram 5 MG tablet Commonly known as: Lexapro Take 1 tablet (5 mg total) by mouth daily.   ferrous sulfate 325 (65 FE) MG tablet Take 325 mg by mouth at bedtime.   fluticasone 50 MCG/ACT nasal spray Commonly known as: FLONASE Place 2 sprays into both nostrils daily as needed for allergies.   Fortify Daily Probiotic Caps Take 1 capsule by mouth daily.   gabapentin 300 MG capsule Commonly  known as: NEURONTIN Take 300 mg by mouth 4 (four) times daily.   insulin aspart 100 UNIT/ML injection Commonly known as: novoLOG Continuous with insulin pump   insulin pump Soln Inject into the skin continuous. Novolog   irbesartan 300 MG tablet Commonly known as: AVAPRO Take 300 mg by mouth daily after breakfast.   levocetirizine 5 MG tablet Commonly known as: XYZAL Take 5 mg by mouth daily.   levothyroxine 88 MCG tablet Commonly known as: SYNTHROID Take 88 mcg by mouth daily before breakfast.   Magnesium 250 MG Tabs Take 500 mg by mouth at bedtime.   methocarbamol 500 MG tablet Commonly known as: ROBAXIN Take 1 tablet (500 mg total) by mouth every 6 (six) hours as needed for muscle spasms.   metoCLOPramide 5 MG tablet Commonly known as: REGLAN Take 5 mg by mouth every 6 (six) hours as needed for nausea.   Mounjaro 5 MG/0.5ML Pen Generic drug: tirzepatide Inject 5 mg into the skin every Wednesday.   ondansetron 8 MG disintegrating tablet Commonly known as: ZOFRAN-ODT Take 1 tablet (8 mg total) by mouth every 8 (eight)  hours as needed for nausea or vomiting.   oxyCODONE 5 MG immediate release tablet Commonly known as: Oxy IR/ROXICODONE Take 1-2 tablets (5-10 mg total) by mouth every 6 (six) hours as needed for severe pain.   potassium gluconate 595 (99 K) MG Tabs tablet Take 595 mg by mouth daily after breakfast.   REFRESH OP Place 1 drop into both eyes in the morning and at bedtime.   traMADol 50 MG tablet Commonly known as: Ultram Take 1-2 tablets (50-100 mg total) by mouth every 6 (six) hours as needed. What changed: reasons to take this   vitamin C 250 MG tablet Commonly known as: ASCORBIC ACID Take 500 mg by mouth 2 (two) times daily.               Discharge Care Instructions  (From admission, onward)           Start     Ordered   04/01/22 0000  Weight bearing as tolerated        04/01/22 0719   04/01/22 0000  Change dressing       Comments: You may remove the bulky bandage (ACE wrap and gauze) two days after surgery. You will have an adhesive waterproof bandage underneath. Leave this in place until your first follow-up appointment.   04/01/22 0719            Follow-up Information     Gaynelle Arabian, MD. Go on 04/15/2022.   Specialty: Orthopedic Surgery Why: You are scheduled for a follow up appointment on 04-15-22 at 2:00 pm. Contact information: [address] [place] [address] [phone]                 Signed: R. Jaynie Bream, PA-C Orthopedic Surgery 04/01/2022, 8:33 PM

## 2022-04-01 NOTE — Inpatient Diabetes Management (Addendum)
Inpatient Diabetes Program Recommendations  AACE/ADA: New Consensus Statement on Inpatient Glycemic Control (2015)  Target Ranges:  Prepandial:   less than 140 mg/dL      Peak postprandial:   less than 180 mg/dL (1-2 hours)      Critically ill patients:  140 - 180 mg/dL   Lab Results  Component Value Date   GLUCAP 178 (H) 03/31/2022   HGBA1C 5.9 (H) 03/20/2022    Review of Glycemic Control  Latest Reference Range & Units 04/01/22 04:00  Glucose 70 - 99 mg/dL 204 (H)  (H): Data is abnormally high Diabetes history: DM1 Outpatient Diabetes medications: Tandem T Slim x2 insulin pump, mounjaro 5 mg q week Current orders for Inpatient glycemic control: Insulin pump, Novolog 0-15 units tid correction scale Decadron 10 mg x 1  Inpatient Diabetes Program Recommendations:   RN states patient does have her insulin pump infusing. Patient is covering correction and meal coverage with insulin pump  If to remain inpatient: -D/C of Novolog correction orders. -Change insulin pump order from "continuous" to TID & HS & 0200 in order to have correct documentation and CBGs.   Secure chat sent to PA and RN to help assist.   Thanks, Bronson Curb, MSN, RNC-OB Diabetes Coordinator [phone] (8a-5p)

## 2022-04-01 NOTE — Progress Notes (Signed)
Subjective: 1 Day Post-Op Procedure(s) (LRB): TOTAL KNEE ARTHROPLASTY (Right) Patient seen in rounds by Dr. Wynelle Link. Patient is well, and has had no acute complaints or problems. Denies SOB or chest pain. Denies calf pain. Voiding without difficulty. Patient reports pain as moderate. Worked with physical therapy yesterday but limited by pani and knee buckling. We will continue physical therapy today.  Objective: Vital signs in last 24 hours: Temp:  [97.5 F (36.4 C)-98.4 F (36.9 C)] 98.4 F (36.9 C) (01/23 0430) Pulse Rate:  [60-85] 85 (01/23 0430) Resp:  [10-18] 17 (01/23 0430) BP: (114-154)/(55-75) 133/57 (01/23 0430) SpO2:  [95 %-100 %] 100 % (01/23 0430)  Intake/Output from previous day:  Intake/Output Summary (Last 24 hours) at 04/01/2022 0715 Last data filed at 04/01/2022 0515 Gross per 24 hour  Intake 1120 ml  Output 1925 ml  Net -805 ml     Intake/Output this shift: No intake/output data recorded.  Labs: Recent Labs    04/01/22 0400  HGB 10.0*   Recent Labs    04/01/22 0400  WBC 7.2  RBC 3.04*  HCT 29.9*  PLT 190   Recent Labs    04/01/22 0400  NA 134*  K 4.4  CL 103  CO2 25  BUN 13  CREATININE 0.76  GLUCOSE 204*  CALCIUM 9.1   No results for input(s): "LABPT", "INR" in the last 72 hours.  Exam: General - Patient is Alert and Oriented Extremity - Neurologically intact Neurovascular intact Sensation intact distally Dorsiflexion/Plantar flexion intact Dressing - dressing C/D/I Motor Function - intact, moving foot and toes well on exam.  Past Medical History:  Diagnosis Date   Anemia    Anxiety    Arthritis    Back pain    Back pain    De Quervain's tenosynovitis, right 01/2012   Degenerative disc disease, cervical    states neck is stiff, reduced range of motion   Dental crowns present    Depression    Diabetes mellitus    Insulin pump   GERD (gastroesophageal reflux disease)    Heart murmur    states has a functional  murmur, and that she has never had any problems   Hypothyroidism    Joint pain    Osteoarthritis    PONV (postoperative nausea and vomiting)     Assessment/Plan: 1 Day Post-Op Procedure(s) (LRB): TOTAL KNEE ARTHROPLASTY (Right) Principal Problem:   Osteoarthritis of right knee Active Problems:   Primary osteoarthritis of right knee  Estimated body mass index is 30 kg/m as calculated from the following:   Height as of this encounter: 5\' 2"  (1.575 m).   Weight as of this encounter: 74.4 kg. Advance diet Up with therapy D/C IV fluids   Patient's anticipated LOS is less than 2 midnights, meeting these requirements: - Younger than 33 - Lives within 1 hour of care - Has a competent adult at home to recover with post-op - NO history of  - Chronic pain requiring opiods  - Coronary Artery Disease  - Heart failure  - Heart attack  - Stroke  - DVT/VTE  - Cardiac arrhythmia  - Respiratory Failure/COPD  - Renal failure  - Anemia  - Advanced Liver disease   DVT Prophylaxis - Aspirin Weight bearing as tolerated.  Continue physical therapy. Expected discharge home today pending progress with physical therapy and if meeting patient goals. Scheduled [facility]. Follow-up in clinic in 2 weeks.  The PDMP database was reviewed today prior to any  opioid medications being prescribed to this patient.  R. Jaynie Bream, PA-C Orthopedic Surgery [phone] 04/01/2022, 7:15 AM

## 2022-04-01 NOTE — TOC Transition Note (Signed)
Transition of Care [facility]) - CM/SW Discharge Note  Patient Details  Name: Colleen Zuniga MRN: [id number] Date of Birth: Jan 01, 1961  Transition of Care [facility]) CM/SW Contact:  Sherie Don, LCSW Phone Number: 04/01/2022, 11:31 AM  Clinical Narrative: Patient is expected to discharge home after working with PT. CSW met with patient to confirm discharge plan. Patient will go home with OPPT at [facility]. Patient has a rolling walker at home, so there are no DME needs at this time. TOC signing off.    Final next level of care: OP Rehab Barriers to Discharge: No Barriers Identified  Patient Goals and CMS Choice Choice offered to / list presented to : NA  Discharge Plan and Services Additional resources added to the After Visit Summary for        DME Arranged: N/A DME Agency: NA HH Arranged: NA HH Agency: NA  Social Determinants of Health (SDOH) Interventions SDOH Screenings   Food Insecurity: No Food Insecurity (03/31/2022)  Housing: Low Risk  (03/31/2022)  Transportation Needs: No Transportation Needs (03/31/2022)  Utilities: Not At Risk (03/31/2022)  Depression (PHQ2-9): Low Risk  (09/17/2021)  Tobacco Use: Medium Risk (03/31/2022)   Readmission Risk Interventions     No data to display

## 2022-04-01 NOTE — Progress Notes (Signed)
Physical Therapy Treatment Patient Details Name: Colleen Zuniga MRN: [id number] DOB: 07-May-1960 Today's Date: 04/01/2022   History of Present Illness Pt is a 62 y.o. female s/p R TKA .  PMH: L TKA 2023, anemia, anxiety, depression, DM, gerd, hypothyroidism, lumbar fusion ([id number]), and  anterior cervical discectomy (2017).    PT Comments    Pt progressing with PT, will see again to reinforce mobility areas and safety as below. Pt will likely be ready to d/c if pain remains controlled  Recommendations for follow up therapy are one component of a multi-disciplinary discharge planning process, led by the attending physician.  Recommendations may be updated based on patient status, additional functional criteria and insurance authorization.  Follow Up Recommendations  Follow physician's recommendations for discharge plan and follow up therapies     Assistance Recommended at Discharge Intermittent Supervision/Assistance  Patient can return home with the following A little help with walking and/or transfers;A little help with bathing/dressing/bathroom;Assist for transportation;Help with stairs or ramp for entrance;Assistance with cooking/housework   Equipment Recommendations  None recommended by PT    Recommendations for Other Services       Precautions / Restrictions Precautions Precautions: Fall;Knee Knee Immobilizer - Right: Discontinue once straight leg raise with < 10 degree lag Restrictions Weight Bearing Restrictions: No Other Position/Activity Restrictions: WBAT     Mobility  Bed Mobility Overal bed mobility: Needs Assistance Bed Mobility: Supine to Sit     Supine to sit: Min assist     General bed mobility comments: assist with RLE, incr time    Transfers Overall transfer level: Needs assistance Equipment used: Rolling walker (2 wheels) Transfers: Sit to/from Stand Sit to Stand: Min guard           General transfer comment: cues for safety and hand  placement, min/guard  assist to rise and stabilize;    Ambulation/Gait Ambulation/Gait assistance: Min guard Gait Distance (Feet): 90 Feet Assistive device: Rolling walker (2 wheels) Gait Pattern/deviations: Step-to pattern, Step-through pattern       General Gait Details: verbal cues for sequence, min/guard assist for safety. no knee buckling, no LOB. able to progress to step through pattern with incr distance   Stairs             Wheelchair Mobility    Modified Rankin (Stroke Patients Only)       Balance                                            Cognition Arousal/Alertness: Awake/alert Behavior During Therapy: WFL for tasks assessed/performed Overall Cognitive Status: Within Functional Limits for tasks assessed                                          Exercises Total Joint Exercises Ankle Circles/Pumps: AROM, Both, 5 reps Quad Sets: AROM, Both, 10 reps Heel Slides: AAROM, Right, 10 reps Hip ABduction/ADduction: AROM, Right, 10 reps Straight Leg Raises: AROM, Right, 10 reps    General Comments        Pertinent Vitals/Pain Pain Assessment Pain Assessment: 0-10 Pain Score: 6  Pain Location: right knee Pain Descriptors / Indicators: Grimacing, Guarding, Aching, Sore Pain Intervention(s): Limited activity within patient's tolerance, Monitored during session, Premedicated before session, Repositioned    Home Living  Prior Function            PT Goals (current goals can now be found in the care plan section) Acute Rehab PT Goals PT Goal Formulation: With patient Time For Goal Achievement: 04/07/22 Potential to Achieve Goals: Good Progress towards PT goals: Progressing toward goals    Frequency    7X/week      PT Plan Current plan remains appropriate    Co-evaluation              AM-PAC PT "6 Clicks" Mobility   Outcome Measure  Help needed turning from your  back to your side while in a flat bed without using bedrails?: A Little Help needed moving from lying on your back to sitting on the side of a flat bed without using bedrails?: A Little Help needed moving to and from a bed to a chair (including a wheelchair)?: A Little Help needed standing up from a chair using your arms (e.g., wheelchair or bedside chair)?: A Little Help needed to walk in hospital room?: A Little Help needed climbing 3-5 steps with a railing? : A Little 6 Click Score: 18    End of Session Equipment Utilized During Treatment: Gait belt Activity Tolerance: Patient tolerated treatment well Patient left: in chair;with call bell/phone within reach;with chair alarm set Nurse Communication: Mobility status PT Visit Diagnosis: Other abnormalities of gait and mobility (R26.89);Difficulty in walking, not elsewhere classified (R26.2)     Time: 9470-9628 PT Time Calculation (min) (ACUTE ONLY): 29 min  Charges:  $Gait Training: 8-22 mins                     Baxter Flattery, PT  Acute Rehab Dept [facility]) [phone]  [facility] Weekend Pager [facility] only)  [phone]  04/01/2022    [facility] 04/01/2022, 12:08 PM

## 2022-04-01 NOTE — Progress Notes (Signed)
04/01/22 1400  PT Visit Information  Last PT Received On 04/01/22  Assistance Needed Excellent progress and pain well controlled. Meeting PT goals. Colleen Zuniga is ready to d/c home with family/friends assisting her as needed. Pt friend, Colleen Zuniga and her sister Colleen Zuniga present for pm PT session.   History of Present Illness Pt is a 62 y.o. female s/p R TKA .  PMH: L TKA 2023, anemia, anxiety, depression, DM, gerd, hypothyroidism, lumbar fusion ([id number]), and  anterior cervical discectomy (2017).  Precautions  Precautions Fall;Knee  Knee Immobilizer - Right Discontinue once straight leg raise with < 10 degree lag  Restrictions  Weight Bearing Restrictions No  Other Position/Activity Restrictions WBAT  Pain Assessment  Pain Assessment 0-10  Pain Score 4  Pain Location right knee  Pain Descriptors / Indicators Grimacing;Guarding;Aching;Sore  Pain Intervention(s) Limited activity within patient's tolerance;Monitored during session;Premedicated before session;Repositioned;Ice applied  Cognition  Arousal/Alertness Awake/alert  Behavior During Therapy WFL for tasks assessed/performed  Overall Cognitive Status Within Functional Limits for tasks assessed  Bed Mobility  Overal bed mobility Needs Assistance  Bed Mobility Supine to Sit;Sit to Supine  Supine to sit Supervision  Sit to supine Supervision  General bed mobility comments for safety only  Transfers  Overall transfer level Needs assistance  Equipment used Rolling walker (2 wheels)  Transfers Sit to/from Stand  Sit to Stand Supervision  General transfer comment cues for safety and hand placement, no physical assist  Ambulation/Gait  Ambulation/Gait assistance Supervision  Gait Distance (Feet) 80 Feet  Assistive device Rolling walker (2 wheels)  Gait Pattern/deviations Step-to pattern;Step-through pattern  General Gait Details verbal cues for sequence and progression. supervision for safety. no knee buckling, no LOB. able to progress to  step through pattern and work on R knee flexion and heel strike; stride length improved with incr distance  Total Joint Exercises  Hip ABduction/ADduction AROM;Right;10 reps  Straight Leg Raises AROM;Right;10 reps  PT - End of Session  Equipment Utilized During Treatment Gait belt  Activity Tolerance Patient tolerated treatment well  Patient left in chair;with call bell/phone within reach;with chair alarm set  Nurse Communication Mobility status   PT - Assessment/Plan  PT Plan Current plan remains appropriate  PT Visit Diagnosis Other abnormalities of gait and mobility (R26.89);Difficulty in walking, not elsewhere classified (R26.2)  PT Frequency (ACUTE ONLY) 7X/week  Follow Up Recommendations Follow physician's recommendations for discharge plan and follow up therapies  Assistance recommended at discharge Intermittent Supervision/Assistance  Patient can return home with the following A little help with walking and/or transfers;A little help with bathing/dressing/bathroom;Assist for transportation;Help with stairs or ramp for entrance;Assistance with cooking/housework  PT equipment None recommended by PT  AM-PAC PT "6 Clicks" Mobility Outcome Measure (Version 2)  Help needed turning from your back to your side while in a flat bed without using bedrails? 3  Help needed moving from lying on your back to sitting on the side of a flat bed without using bedrails? 3  Help needed moving to and from a bed to a chair (including a wheelchair)? 3  Help needed standing up from a chair using your arms (e.g., wheelchair or bedside chair)? 3  Help needed to walk in hospital room? 3  Help needed climbing 3-5 steps with a railing?  3  6 Click Score 18  Consider Recommendation of Discharge To: Home with [facility]  PT Goal Progression  Progress towards PT goals Progressing toward goals  Acute Rehab PT Goals  PT Goal Formulation With patient  Time For Goal Achievement 04/07/22  Potential to Achieve Goals Good   PT Time Calculation  PT Start Time (ACUTE ONLY) 1340  PT Stop Time (ACUTE ONLY) 1400  PT Time Calculation (min) (ACUTE ONLY) 20 min  PT General Charges  $$ ACUTE PT VISIT 1 Visit  PT Treatments  $Gait Training 8-22 mins

## 2022-04-02 NOTE — Anesthesia Postprocedure Evaluation (Addendum)
Anesthesia Post Note  Patient: Surah Pelley  Procedure(s) Performed: TOTAL KNEE ARTHROPLASTY (Right: Knee)     Patient location during evaluation: PACU Anesthesia Type: General and Regional Level of consciousness: awake and alert Pain management: pain level controlled Vital Signs Assessment: post-procedure vital signs reviewed and stable Respiratory status: spontaneous breathing, nonlabored ventilation, respiratory function stable and patient connected to nasal cannula oxygen Cardiovascular status: blood pressure returned to baseline and stable Postop Assessment: no apparent nausea or vomiting Anesthetic complications: no   No notable events documented.  Last Vitals:  Vitals:   04/01/22 0926 04/01/22 1215  BP: (!) 121/38 125/61  Pulse: 78 75  Resp: 18 18  Temp: 36.8 C 36.9 C  SpO2: 100% 98%    Last Pain:  Vitals:   04/01/22 1312  TempSrc:   PainSc: 7                  Airam Runions S

## 2022-04-16 ENCOUNTER — Other Ambulatory Visit (INDEPENDENT_AMBULATORY_CARE_PROVIDER_SITE_OTHER): Payer: Self-pay | Admitting: Family Medicine

## 2022-04-16 DIAGNOSIS — F411 Generalized anxiety disorder: Secondary | ICD-10-CM

## 2022-04-16 NOTE — Progress Notes (Signed)
Chief Complaint:   OBESITY Colleen Zuniga is here to discuss her progress with her obesity treatment plan along with follow-up of her obesity related diagnoses. Colleen Zuniga is on the Category 1 Plan and states she is following her eating plan approximately 75% of the time. Colleen Zuniga states she is not exercising.  Today's visit was #: 8 Starting weight: 190 LBS Starting date: 01/18/2022 Today's weight: 164 LBS Today's date: 03/25/2022 Total lbs lost to date: 26 LBS Total lbs lost since last in-office visit: 3 LBS  Interim History: She has not been doing was well on her meal plan dealing with her husband's dementia and getting ready for a right total knee replacement on 03/31/2022.  Patient has a left total knee replacement done in October and did well.  She plans to do PT and increase exercise.  She tends to skip third meal and only having a snack.  Patient is on Mounjaro 5 mg weekly through PCP.  Subjective:   1. Type 1 diabetes mellitus with other specified complication [facility]) Patient has history of brittle diabetes.  Still having lows and highs.  Patient is having problems with CGM working.  Having readings 40 and under.  She is trying not to skip meals.  She sees [facility] certified diabetic educator.  2. Osteoarthritis of knee, unspecified laterality, unspecified osteoarthritis type Patient has successfully had left total knee replacement October 2023 and will have right total knee replacement next week with Dr. Maureen Ralphs.  Has a sister and friend for help.  Mobility has improved with weight loss.  3. Caregiver stress Her husband's dementia has been progressing.  She skips meals at times due to mood and being busy.  4. GAD (generalized anxiety disorder) Patient reports high anxiety with husband's health affecting her sleep at night and she feels irritable.  Assessment/Plan:   1. Type 1 diabetes mellitus with other specified complication [facility]) Reach out to [facility] regarding recent 61 LBS of  weight loss and a decrease in visceral adiposity, and plans to increase walking, all which improve insulin sensitivity.  2. Osteoarthritis of knee, unspecified laterality, unspecified osteoarthritis type Plan for physical therapy, s/p right total knee replacement.  Then plan for regular exercise.  3. Caregiver stress Discussed making time for meals especially with hypoglycemia for type 1 diabetes.  4. GAD (generalized anxiety disorder) Begin- escitalopram (LEXAPRO) 5 MG tablet; Take 1 tablet (5 mg total) by mouth daily.  Dispense: 30 tablet; Refill: 0  5. Obesity, current BMI 30.0 Reviewed overall progress, decreased 13.6% TBW.  Colleen Zuniga is currently in the action stage of change. As such, her goal is to continue with weight loss efforts. She has agreed to the Category 2 Plan.   Exercise goals: All adults should avoid inactivity. Some physical activity is better than none, and adults who participate in any amount of physical activity gain some health benefits.  Behavioral modification strategies: increasing lean protein intake, increasing water intake, no skipping meals, keeping healthy foods in the home, and planning for success.  Colleen Zuniga has agreed to follow-up with our clinic in 4-5 weeks. She was informed of the importance of frequent follow-up visits to maximize her success with intensive lifestyle modifications for her multiple health conditions.   Objective:   Blood pressure 130/70, pulse 78, temperature 97.9 F (36.6 C), height 5' 2"$  (1.575 m), weight 164 lb (74.4 kg), SpO2 99 %. Body mass index is 30 kg/m.  General: Cooperative, alert, well developed, in no acute distress. HEENT: Conjunctivae and  lids unremarkable. Cardiovascular: Regular rhythm.  Lungs: Normal work of breathing. Neurologic: No focal deficits.   Lab Results  Component Value Date   CREATININE 0.76 04/01/2022   BUN 13 04/01/2022   NA 134 (L) 04/01/2022   K 4.4 04/01/2022   CL 103 04/01/2022   CO2 25  04/01/2022   Lab Results  Component Value Date   ALT 27 09/17/2021   AST 33 09/17/2021   ALKPHOS 129 (H) 09/17/2021   BILITOT 0.5 09/17/2021   Lab Results  Component Value Date   HGBA1C 5.9 (H) 03/20/2022   HGBA1C 6.3 (H) 09/17/2021   HGBA1C 6.8 (H) 07/28/2019   HGBA1C 6.4 (H) 07/28/2018   HGBA1C 6.2 (H) 04/29/2018   Lab Results  Component Value Date   INSULIN <0.4 (L) 09/17/2021   Lab Results  Component Value Date   TSH 0.049 (L) 09/17/2021   Lab Results  Component Value Date   CHOL 156 04/29/2018   HDL 74 04/29/2018   LDLCALC 67 04/29/2018   TRIG 76 04/29/2018   Lab Results  Component Value Date   VD25OH 70.8 09/17/2021   VD25OH 41.3 04/29/2018   Lab Results  Component Value Date   WBC 7.2 04/01/2022   HGB 10.0 (L) 04/01/2022   HCT 29.9 (L) 04/01/2022   MCV 98.4 04/01/2022   PLT 190 04/01/2022   Lab Results  Component Value Date   IRON 37 01/18/2019   TIBC 233 (L) 01/18/2019   FERRITIN 21 01/18/2019   Attestation Statements:   Reviewed by clinician on day of visit: allergies, medications, problem list, medical history, surgical history, family history, social history, and previous encounter notes.  I, Davy Pique, am acting as Location manager for Loyal Gambler, DO.  I have reviewed the above documentation for accuracy and completeness, and I agree with the above. Dell Ponto, DO

## 2022-04-17 ENCOUNTER — Telehealth (INDEPENDENT_AMBULATORY_CARE_PROVIDER_SITE_OTHER): Payer: Self-pay | Admitting: Family Medicine

## 2022-04-17 NOTE — Telephone Encounter (Signed)
Pt call 04/17/22 states need refill on Lexapro.She will take her last one on 04/19/22

## 2022-04-29 ENCOUNTER — Encounter (INDEPENDENT_AMBULATORY_CARE_PROVIDER_SITE_OTHER): Payer: Self-pay | Admitting: Family Medicine

## 2022-04-29 ENCOUNTER — Ambulatory Visit (INDEPENDENT_AMBULATORY_CARE_PROVIDER_SITE_OTHER): Payer: Medicare Other | Admitting: Family Medicine

## 2022-04-29 VITALS — BP 127/66 | HR 76 | Temp 97.8°F | Ht 62.0 in | Wt 158.0 lb

## 2022-04-29 DIAGNOSIS — E669 Obesity, unspecified: Secondary | ICD-10-CM | POA: Diagnosis not present

## 2022-04-29 DIAGNOSIS — E1069 Type 1 diabetes mellitus with other specified complication: Secondary | ICD-10-CM

## 2022-04-29 DIAGNOSIS — Z6828 Body mass index (BMI) 28.0-28.9, adult: Secondary | ICD-10-CM

## 2022-04-29 DIAGNOSIS — Z636 Dependent relative needing care at home: Secondary | ICD-10-CM | POA: Diagnosis not present

## 2022-04-29 MED ORDER — ESCITALOPRAM OXALATE 10 MG PO TABS: 10.0000 mg | ORAL_TABLET | Freq: Every day | ORAL | 0 refills | Status: DC

## 2022-04-29 NOTE — Assessment & Plan Note (Addendum)
Taking Lexapro 5 mg in the evening which has helped Husband was diagnosed with vascular dementia and started Aricept She is sleeping better and managing stressors  Refilled Lexapro, increased to 10 mg qHS #90 tab, no refills

## 2022-04-29 NOTE — Assessment & Plan Note (Addendum)
Has a long hx of brittle type I diabetes managed by [facility] Still having some hypoglycemia episodes into the 40s - usually evening like 1 hr after dinner Still sometimes skipping lunch out of habit  Encouraged no meal skipping Add 1/4 plate at dinner of a high fiber starchy vegetable F/u with [facility] Continue to monitor blood sugars

## 2022-04-29 NOTE — Progress Notes (Signed)
Office: [phone]  /  Fax: [phone]  WEIGHT SUMMARY AND BIOMETRICS  Medical Weight Loss Height: 5' 2"$  (1.575 m) Weight: 158 lb (71.7 kg) Temp: 97.8 F (36.6 C) Pulse Rate: 76 BP: 127/66 SpO2: 100 % Fasting: no Labs: no Today's Visit #: 9 Weight at Last VIsit: 164lb Weight Lost Since Last Visit: 6lb  Body Fat %: 43.9 % Fat Mass (lbs): 69.8 lbs Muscle Mass (lbs): 84.4 lbs Visceral Fat Rating : 11 Starting Date: 01/18/22 Starting Weight: 190lb Total Weight Loss (lbs): 32 lb (14.5 kg)    HPI  Chief Complaint: OBESITY  Colleen Zuniga is here to discuss her progress with her obesity treatment plan. She is on the the Category 2 Plan and states she is following her eating plan approximately 80 % of the time. She states she is walking and physical therapy 3 times a week for 20 minutes.     Interval History:  Since last office visit she is down 6 lb since last visit Her net weight loss is 32 lb in the past 7 mos of medically supervised weight management.  This is a 16.8% TBW loss. She is recovering from R TKR 4 wks ago- doing well and in PT for 2 more weeks Started adding in some outdoor walks Taking Mounjaro through PCP Mindful about food intake but still skips lunch most days Having low blood sugars in the evenings - has brittle type I diabetes  Pharmacotherapy: none  PHYSICAL EXAM:  Blood pressure 127/66, pulse 76, temperature 97.8 F (36.6 C), height 5' 2"$  (1.575 m), weight 158 lb (71.7 kg), SpO2 100 %. Body mass index is 28.9 kg/m.  General: She is overweight, cooperative, alert, well developed, and in no acute distress. PSYCH: Has normal mood, affect and thought process.   HEENT: EOMI, sclerae are anicteric. Lungs: Normal breathing effort, no conversational dyspnea. Extremities: No edema.  Neurologic: No gross sensory or motor deficits. No tremors or fasciculations noted.    DIAGNOSTIC DATA REVIEWED:  BMET    Component Value Date/Time   NA 134 (L)  04/01/2022 0400   NA 140 09/17/2021 1541   K 4.4 04/01/2022 0400   CL 103 04/01/2022 0400   CO2 25 04/01/2022 0400   GLUCOSE 204 (H) 04/01/2022 0400   BUN 13 04/01/2022 0400   BUN 20 09/17/2021 1541   CREATININE 0.76 04/01/2022 0400   CALCIUM 9.1 04/01/2022 0400   GFRNONAA >60 04/01/2022 0400   GFRAA >60 08/08/2019 0920   Lab Results  Component Value Date   HGBA1C 5.9 (H) 03/20/2022   HGBA1C 6.1 (H) 08/11/2016   Lab Results  Component Value Date   INSULIN <0.4 (L) 09/17/2021   Lab Results  Component Value Date   TSH 0.049 (L) 09/17/2021   CBC    Component Value Date/Time   WBC 7.2 04/01/2022 0400   RBC 3.04 (L) 04/01/2022 0400   HGB 10.0 (L) 04/01/2022 0400   HGB 10.7 (L) 01/18/2019 0812   HCT 29.9 (L) 04/01/2022 0400   HCT 33.1 (L) 01/18/2019 0812   PLT 190 04/01/2022 0400   PLT 217 01/18/2019 0812   MCV 98.4 04/01/2022 0400   MCV 92 01/18/2019 0812   MCH 32.9 04/01/2022 0400   MCHC 33.4 04/01/2022 0400   RDW 11.4 (L) 04/01/2022 0400   RDW 12.6 01/18/2019 0812   Iron Studies    Component Value Date/Time   IRON 37 01/18/2019 0812   TIBC 233 (L) 01/18/2019 0812   FERRITIN 21 01/18/2019 [id number]  IRONPCTSAT 16 01/18/2019 0812   Lipid Panel     Component Value Date/Time   CHOL 156 04/29/2018 1354   TRIG 76 04/29/2018 1354   HDL 74 04/29/2018 1354   LDLCALC 67 04/29/2018 1354   Hepatic Function Panel     Component Value Date/Time   PROT 6.7 09/17/2021 1541   ALBUMIN 4.5 09/17/2021 1541   AST 33 09/17/2021 1541   ALT 27 09/17/2021 1541   ALKPHOS 129 (H) 09/17/2021 1541   BILITOT 0.5 09/17/2021 1541      Component Value Date/Time   TSH 0.049 (L) 09/17/2021 1541   Nutritional Lab Results  Component Value Date   VD25OH 70.8 09/17/2021   VD25OH 41.3 04/29/2018     ASSESSMENT AND PLAN  TREATMENT PLAN FOR OBESITY:  Recommended Dietary Goals  Colleen Zuniga is currently in the action stage of change. As such, her goal is to continue weight management  plan. She has agreed to the Category 2 Plan.  Behavioral Intervention  We discussed the following Behavioral Modification Strategies today: increasing lean protein intake, increasing vegetables, increasing fiber rich foods, increase water intake, work on meal planning and easy cooking plans, and think about ways to increase physical activity.  Additional resources provided today: NA  Recommended Physical Activity Goals  Colleen Zuniga has been advised to work up to 150 minutes of moderate intensity aerobic activity a week and strengthening exercises 2-3 times per week for cardiovascular health, weight loss maintenance and preservation of muscle mass.   She has agreed to Will continue regular aerobic exercise 20 minutes, 5 times per week. Chosen activity walking.   Pharmacotherapy We discussed various medication options to help Colleen Zuniga with her weight loss efforts and we both agreed to none.  ASSOCIATED CONDITIONS ADDRESSED TODAY  Obesity,current BMi 28.89  Type 1 diabetes mellitus with other specified complication ([place]) Assessment & Plan: Has a long hx of brittle type I diabetes managed by Colleen Zuniga Still having some hypoglycemia episodes into the 40s - usually evening like 1 hr after dinner Still sometimes skipping lunch out of habit  Encouraged no meal skipping Add 1/4 plate at dinner of a high fiber starchy vegetable F/u with Colleen Zuniga Continue to monitor blood sugars    Caregiver stress Assessment & Plan: Taking Lexapro 5 mg in the evening which has helped Husband was diagnosed with vascular dementia and started Aricept She is sleeping better and managing stressors  Refilled Lexapro, increased to 10 mg qHS #90 tab, no refills    Generalized obesity  Other orders -     Escitalopram Oxalate; Take 1 tablet (10 mg total) by mouth at bedtime.  Dispense: 90 tablet; Refill: 0      No follow-ups on file.Marland Kitchen She was informed of the importance of frequent follow up visits to  maximize her success with intensive lifestyle modifications for her multiple health conditions.   ATTESTASTION STATEMENTS:  Reviewed by clinician on day of visit: allergies, medications, problem list, medical history, surgical history, family history, social history, and previous encounter notes.   I have personally spent 30 minutes total time today in preparation, patient care, nutritional counseling and documentation for this visit, including the following: review of clinical lab tests; review of medical tests/procedures/services.      Dell Ponto, DO

## 2022-06-12 ENCOUNTER — Ambulatory Visit (INDEPENDENT_AMBULATORY_CARE_PROVIDER_SITE_OTHER): Payer: Medicare Other | Admitting: Family Medicine

## 2022-06-12 ENCOUNTER — Encounter (INDEPENDENT_AMBULATORY_CARE_PROVIDER_SITE_OTHER): Payer: Self-pay | Admitting: Family Medicine

## 2022-06-12 VITALS — BP 122/70 | HR 69 | Temp 98.0°F | Ht 62.0 in | Wt 155.0 lb

## 2022-06-12 DIAGNOSIS — E1069 Type 1 diabetes mellitus with other specified complication: Secondary | ICD-10-CM

## 2022-06-12 DIAGNOSIS — E669 Obesity, unspecified: Secondary | ICD-10-CM | POA: Diagnosis not present

## 2022-06-12 DIAGNOSIS — Z636 Dependent relative needing care at home: Secondary | ICD-10-CM | POA: Diagnosis not present

## 2022-06-12 DIAGNOSIS — Z794 Long term (current) use of insulin: Secondary | ICD-10-CM

## 2022-06-12 DIAGNOSIS — M170 Bilateral primary osteoarthritis of knee: Secondary | ICD-10-CM

## 2022-06-12 DIAGNOSIS — Z7985 Long-term (current) use of injectable non-insulin antidiabetic drugs: Secondary | ICD-10-CM

## 2022-06-12 DIAGNOSIS — Z6828 Body mass index (BMI) 28.0-28.9, adult: Secondary | ICD-10-CM

## 2022-06-12 NOTE — Assessment & Plan Note (Signed)
Caregiver stress is still an issue but has improved with use of Lexapro 10 mg once daily.  She is now in a  dementia support group.  She has caregiver stress from both her husband and her mom.  Continue to work on stress reduction, proper sleep at night, good nutrition and continue Lexapro 10 mg once daily

## 2022-06-12 NOTE — Progress Notes (Signed)
Office: [phone]  /  Fax: [place]  Starting Date: 01/18/22  Starting Weight: 191lb   Weight Lost Since Last Visit: 3lb   Vitals Temp: 98 F (36.7 C) BP: 122/70 Pulse Rate: 69 SpO2: 100 %   Body Composition  Body Fat %: 43.3 % Fat Mass (lbs): 67.4 lbs Muscle Mass (lbs): 83.8 lbs Visceral Fat Rating : 11   HPI  Chief Complaint: OBESITY  Jeanae is here to discuss her progress with her obesity treatment plan. She is on the the Category 2 Plan and states she is following her eating plan approximately 80 % of the time. She states she is exercising 20 minutes 5 times per week.   Interval History:  Since last office visit she is down 3 lb This gives her a net weight loss of 35 pounds in the past 5 months of medically supervised weight management. She is able to move around better.  She would like to maintain her body weight around 150 pounds. 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 Body mass index is 28.35 kg/m.  General: She is overweight, cooperative, alert, well developed, and in no acute distress. PSYCH: Has normal mood, affect and thought process.   Lungs: Normal breathing effort, no conversational dyspnea.   ASSESSMENT AND PLAN  TREATMENT PLAN FOR OBESITY:  Recommended Dietary Goals  Timeka is currently in the action stage of change. As such, her goal is to continue weight management plan. She has agreed to the Category 2 Plan.  Behavioral Intervention  We discussed the following Behavioral  Modification Strategies today: increasing lean protein intake, increasing fiber rich foods, avoiding skipping meals, increasing water intake, work on meal planning and preparation, emotional eating strategies and understanding the difference between hunger signals and cravings, work on managing stress, creating time for self-care and relaxation measures, and planning for success.  Additional resources provided today: NA  Recommended Physical Activity Goals  Arista has been advised to work up to 150 minutes of moderate intensity aerobic activity a week and strengthening exercises 2-3 times per week for cardiovascular health, weight loss maintenance and preservation of muscle mass.   She has agreed to Think about ways to increase physical activity and Start strengthening exercises with a goal of 2-3 sessions a week   Pharmacotherapy changes for the treatment of obesity: No changes  ASSOCIATED CONDITIONS ADDRESSED TODAY  Type 1 diabetes mellitus with other specified complication Assessment & Plan: Lab Results  Component Value Date   HGBA1C 5.9 (H) 03/20/2022   Managed by PCP.  She notes a lower A1c at her most recent visit.  She is on NovoLog and Mounjaro 5 mg once weekly.  She has brittle type 1 diabetes.  Her lowest blood sugar was 45 in the middle of the night last night.  Overall, her hypoglycemia has improved with eating 3 meals a day.  Plan continue to monitor blood sugars frequently.  Eat on the schedule including 3 meals and 2 snacks per day.  Focus on  fiber, healthy fat and lean protein to help stabilize blood sugars.  Plan to increase walking time.   Caregiver stress Assessment & Plan: Caregiver stress is still an issue but has improved with use of Lexapro 10 mg once daily.  She is now in a  dementia support group.  She has caregiver stress from both her husband and her mom.  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  She has completed physical therapy and is able to walk up to 1/2 mile most days of the week.  She plans to buy a bike.  Denies daily pain or swelling.  Follow-up with orthopedics as scheduled.  Continue to increase walking time with a goal of 20 to 30 minutes most days of the week. We discussed adding on resistance training exercises that she can do at home for 20 minutes 4-5 days a week       She was informed of the importance of frequent follow up visits to maximize her success with intensive lifestyle modifications for her multiple health conditions.   ATTESTASTION STATEMENTS:  Reviewed by clinician on day of visit: allergies, medications, problem list, medical history, surgical history, family history, social history, and previous encounter notes pertinent to obesity diagnosis.   I have personally spent 30 minutes total time today in preparation, patient care, nutritional counseling and documentation for this visit, including the following: review of clinical lab tests; review of medical tests/procedures/services.      Dell Ponto, DO DABFM, DABOM [facility] [address] [place], [place] [address] [phone]

## 2022-06-12 NOTE — Assessment & Plan Note (Signed)
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  She has completed physical therapy and is able to walk up to 1/2 mile most days of the week.  She plans to buy a bike.  Denies daily pain or swelling.  Follow-up with orthopedics as scheduled.  Continue to increase walking time with a goal of 20 to 30 minutes most days of the week. We discussed adding on resistance training exercises that she can do at home for 20 minutes 4-5 days a week

## 2022-06-12 NOTE — Assessment & Plan Note (Signed)
Lab Results  Component Value Date   HGBA1C 5.9 (H) 03/20/2022   Managed by PCP.  She notes a lower A1c at her most recent visit.  She is on NovoLog and Mounjaro 5 mg once weekly.  She has brittle type 1 diabetes.  Her lowest blood sugar was 45 in the middle of the night last night.  Overall, her hypoglycemia has improved with eating 3 meals a day.  Plan continue to monitor blood sugars frequently.  Eat on the schedule including 3 meals and 2 snacks per day.  Focus on fiber, healthy fat and lean protein to help stabilize blood sugars.  Plan to increase walking time.

## 2022-08-02 ENCOUNTER — Emergency Department (HOSPITAL_COMMUNITY): Payer: Medicare Other

## 2022-08-02 ENCOUNTER — Encounter (HOSPITAL_COMMUNITY): Payer: Self-pay

## 2022-08-02 ENCOUNTER — Inpatient Hospital Stay (HOSPITAL_COMMUNITY)
Admission: EM | Admit: 2022-08-02 | Discharge: 2022-08-05 | DRG: 690 | Disposition: A | Discharge status: Home / Self Care | Payer: Medicare Other | Attending: Internal Medicine | Admitting: Internal Medicine

## 2022-08-02 ENCOUNTER — Other Ambulatory Visit: Payer: Self-pay

## 2022-08-02 DIAGNOSIS — M1990 Unspecified osteoarthritis, unspecified site: Secondary | ICD-10-CM | POA: Diagnosis present

## 2022-08-02 DIAGNOSIS — Z881 Allergy status to other antibiotic agents status: Secondary | ICD-10-CM

## 2022-08-02 DIAGNOSIS — D509 Iron deficiency anemia, unspecified: Secondary | ICD-10-CM | POA: Diagnosis present

## 2022-08-02 DIAGNOSIS — R010 Benign and innocent cardiac murmurs: Secondary | ICD-10-CM | POA: Diagnosis present

## 2022-08-02 DIAGNOSIS — E1065 Type 1 diabetes mellitus with hyperglycemia: Secondary | ICD-10-CM | POA: Diagnosis present

## 2022-08-02 DIAGNOSIS — Z96653 Presence of artificial knee joint, bilateral: Secondary | ICD-10-CM | POA: Diagnosis present

## 2022-08-02 DIAGNOSIS — Z880 Allergy status to penicillin: Secondary | ICD-10-CM

## 2022-08-02 DIAGNOSIS — N10 Acute tubulo-interstitial nephritis: Principal | ICD-10-CM | POA: Diagnosis present

## 2022-08-02 DIAGNOSIS — Z87891 Personal history of nicotine dependence: Secondary | ICD-10-CM

## 2022-08-02 DIAGNOSIS — M545 Low back pain, unspecified: Secondary | ICD-10-CM | POA: Diagnosis present

## 2022-08-02 DIAGNOSIS — K219 Gastro-esophageal reflux disease without esophagitis: Secondary | ICD-10-CM | POA: Diagnosis present

## 2022-08-02 DIAGNOSIS — Z833 Family history of diabetes mellitus: Secondary | ICD-10-CM

## 2022-08-02 DIAGNOSIS — Z79899 Other long term (current) drug therapy: Secondary | ICD-10-CM

## 2022-08-02 DIAGNOSIS — Z885 Allergy status to narcotic agent status: Secondary | ICD-10-CM

## 2022-08-02 DIAGNOSIS — Z981 Arthrodesis status: Secondary | ICD-10-CM

## 2022-08-02 DIAGNOSIS — I10 Essential (primary) hypertension: Secondary | ICD-10-CM | POA: Diagnosis present

## 2022-08-02 DIAGNOSIS — Z79890 Hormone replacement therapy (postmenopausal): Secondary | ICD-10-CM | POA: Diagnosis not present

## 2022-08-02 DIAGNOSIS — E861 Hypovolemia: Secondary | ICD-10-CM | POA: Diagnosis present

## 2022-08-02 DIAGNOSIS — E871 Hypo-osmolality and hyponatremia: Secondary | ICD-10-CM | POA: Diagnosis present

## 2022-08-02 DIAGNOSIS — R7989 Other specified abnormal findings of blood chemistry: Secondary | ICD-10-CM | POA: Diagnosis present

## 2022-08-02 DIAGNOSIS — R300 Dysuria: Secondary | ICD-10-CM | POA: Diagnosis not present

## 2022-08-02 DIAGNOSIS — E109 Type 1 diabetes mellitus without complications: Secondary | ICD-10-CM | POA: Diagnosis present

## 2022-08-02 DIAGNOSIS — A419 Sepsis, unspecified organism: Secondary | ICD-10-CM | POA: Diagnosis present

## 2022-08-02 DIAGNOSIS — F32A Depression, unspecified: Secondary | ICD-10-CM | POA: Diagnosis present

## 2022-08-02 DIAGNOSIS — Z7985 Long-term (current) use of injectable non-insulin antidiabetic drugs: Secondary | ICD-10-CM

## 2022-08-02 DIAGNOSIS — R7401 Elevation of levels of liver transaminase levels: Secondary | ICD-10-CM | POA: Diagnosis present

## 2022-08-02 DIAGNOSIS — N12 Tubulo-interstitial nephritis, not specified as acute or chronic: Principal | ICD-10-CM | POA: Diagnosis present

## 2022-08-02 DIAGNOSIS — E039 Hypothyroidism, unspecified: Secondary | ICD-10-CM | POA: Diagnosis present

## 2022-08-02 DIAGNOSIS — Z794 Long term (current) use of insulin: Secondary | ICD-10-CM | POA: Diagnosis not present

## 2022-08-02 DIAGNOSIS — F411 Generalized anxiety disorder: Secondary | ICD-10-CM | POA: Diagnosis present

## 2022-08-02 DIAGNOSIS — Z8249 Family history of ischemic heart disease and other diseases of the circulatory system: Secondary | ICD-10-CM

## 2022-08-02 DIAGNOSIS — Z9641 Presence of insulin pump (external) (internal): Secondary | ICD-10-CM | POA: Diagnosis present

## 2022-08-02 LAB — URINALYSIS, W/ REFLEX TO CULTURE (INFECTION SUSPECTED)
Bilirubin Urine: NEGATIVE
Glucose, UA: NEGATIVE mg/dL
Hgb urine dipstick: NEGATIVE
Ketones, ur: 5 mg/dL — AB
Nitrite: NEGATIVE
Protein, ur: 100 mg/dL — AB
Specific Gravity, Urine: 1.016 (ref 1.005–1.030)
WBC, UA: >50 WBC/hpf (ref 0–5)
pH: 6 (ref 5.0–8.0)

## 2022-08-02 LAB — CBC WITH DIFFERENTIAL/PLATELET
Abs Immature Granulocytes: 0.04 10*3/uL (ref 0.00–0.07)
Basophils Absolute: 0 10*3/uL (ref 0.0–0.1)
Basophils Relative: 0 %
Eosinophils Absolute: 0 10*3/uL (ref 0.0–0.5)
Eosinophils Relative: 0 %
HCT: 34 % — ABNORMAL LOW (ref 36.0–46.0)
Hemoglobin: 11.5 g/dL — ABNORMAL LOW (ref 12.0–15.0)
Immature Granulocytes: 0 %
Lymphocytes Relative: 6 %
Lymphs Abs: 0.6 10*3/uL — ABNORMAL LOW (ref 0.7–4.0)
MCH: 32.9 pg (ref 26.0–34.0)
MCHC: 33.8 g/dL (ref 30.0–36.0)
MCV: 97.1 fL (ref 80.0–100.0)
Monocytes Absolute: 0.9 10*3/uL (ref 0.1–1.0)
Monocytes Relative: 8 %
Neutro Abs: 9.4 10*3/uL — ABNORMAL HIGH (ref 1.7–7.7)
Neutrophils Relative %: 86 %
Platelets: 171 10*3/uL (ref 150–400)
RBC: 3.5 MIL/uL — ABNORMAL LOW (ref 3.87–5.11)
RDW: 11.9 % (ref 11.5–15.5)
WBC: 11 10*3/uL — ABNORMAL HIGH (ref 4.0–10.5)
nRBC: 0 % (ref 0.0–0.2)

## 2022-08-02 LAB — BLOOD GAS, VENOUS
Acid-Base Excess: 4.5 mmol/L — ABNORMAL HIGH (ref 0.0–2.0)
Bicarbonate: 29.2 mmol/L — ABNORMAL HIGH (ref 20.0–28.0)
O2 Saturation: 72.3 %
Patient temperature: 37
pCO2, Ven: 43 mmHg — ABNORMAL LOW (ref 44–60)
pH, Ven: 7.44 — ABNORMAL HIGH (ref 7.25–7.43)
pO2, Ven: 38 mmHg (ref 32–45)

## 2022-08-02 LAB — LIPASE, BLOOD: Lipase: 31 U/L (ref 11–51)

## 2022-08-02 LAB — GLUCOSE, CAPILLARY
Glucose-Capillary: 62 mg/dL — ABNORMAL LOW (ref 70–99)
Glucose-Capillary: 84 mg/dL (ref 70–99)

## 2022-08-02 LAB — COMPREHENSIVE METABOLIC PANEL
ALT: 63 U/L — ABNORMAL HIGH (ref 0–44)
AST: 78 U/L — ABNORMAL HIGH (ref 15–41)
Albumin: 3.6 g/dL (ref 3.5–5.0)
Alkaline Phosphatase: 115 U/L (ref 38–126)
Anion gap: 11 (ref 5–15)
BUN: 13 mg/dL (ref 8–23)
CO2: 22 mmol/L (ref 22–32)
Calcium: 9.1 mg/dL (ref 8.9–10.3)
Chloride: 98 mmol/L (ref 98–111)
Creatinine, Ser: 0.82 mg/dL (ref 0.44–1.00)
GFR, Estimated: >60 mL/min (ref >60)
Glucose, Bld: 105 mg/dL — ABNORMAL HIGH (ref 70–99)
Potassium: 3.6 mmol/L (ref 3.5–5.1)
Sodium: 131 mmol/L — ABNORMAL LOW (ref 135–145)
Total Bilirubin: 1.9 mg/dL — ABNORMAL HIGH (ref 0.3–1.2)
Total Protein: 6.4 g/dL — ABNORMAL LOW (ref 6.5–8.1)

## 2022-08-02 LAB — LACTIC ACID, PLASMA
Lactic Acid, Venous: 1.2 mmol/L (ref 0.5–1.9)
Lactic Acid, Venous: 1.5 mmol/L (ref 0.5–1.9)

## 2022-08-02 LAB — CBG MONITORING, ED: Glucose-Capillary: 106 mg/dL — ABNORMAL HIGH (ref 70–99)

## 2022-08-02 MED ORDER — ENOXAPARIN SODIUM 40 MG/0.4ML IJ SOSY
40.0000 mg | PREFILLED_SYRINGE | INTRAMUSCULAR | Status: DC
  Administered 2022-08-02 – 2022-08-04 (×3): 40 mg via SUBCUTANEOUS
  Filled 2022-08-02 (×3): qty 0.4

## 2022-08-02 MED ORDER — IBUPROFEN 200 MG PO TABS
400.0000 mg | ORAL_TABLET | Freq: Four times a day (QID) | ORAL | Status: DC | PRN
  Administered 2022-08-02 – 2022-08-03 (×5): 400 mg via ORAL
  Filled 2022-08-02 (×4): qty 2

## 2022-08-02 MED ORDER — FERROUS SULFATE 325 (65 FE) MG PO TABS
325.0000 mg | ORAL_TABLET | Freq: Every day | ORAL | Status: DC
  Filled 2022-08-02: qty 1

## 2022-08-02 MED ORDER — ALBUTEROL SULFATE (2.5 MG/3ML) 0.083% IN NEBU: 2.5000 mg | INHALATION_SOLUTION | RESPIRATORY_TRACT | Status: DC | PRN

## 2022-08-02 MED ORDER — METHOCARBAMOL 500 MG PO TABS
500.0000 mg | ORAL_TABLET | Freq: Four times a day (QID) | ORAL | Status: DC | PRN
  Administered 2022-08-03: 500 mg via ORAL
  Filled 2022-08-02: qty 1

## 2022-08-02 MED ORDER — ESCITALOPRAM OXALATE 10 MG PO TABS
10.0000 mg | ORAL_TABLET | Freq: Every day | ORAL | Status: DC
  Administered 2022-08-03 – 2022-08-04 (×2): 10 mg via ORAL
  Filled 2022-08-02 (×2): qty 1

## 2022-08-02 MED ORDER — GABAPENTIN 300 MG PO CAPS
300.0000 mg | ORAL_CAPSULE | Freq: Three times a day (TID) | ORAL | Status: DC
  Administered 2022-08-02 – 2022-08-05 (×8): 300 mg via ORAL
  Filled 2022-08-02 (×8): qty 1

## 2022-08-02 MED ORDER — ONDANSETRON HCL 4 MG/2ML IJ SOLN
4.0000 mg | Freq: Four times a day (QID) | INTRAMUSCULAR | Status: DC | PRN
  Administered 2022-08-02: 4 mg via INTRAVENOUS
  Filled 2022-08-02: qty 2

## 2022-08-02 MED ORDER — LEVOTHYROXINE SODIUM 88 MCG PO TABS
88.0000 ug | ORAL_TABLET | Freq: Every day | ORAL | Status: DC
  Administered 2022-08-03 – 2022-08-04 (×2): 88 ug via ORAL
  Filled 2022-08-02 (×2): qty 1

## 2022-08-02 MED ORDER — ACETAMINOPHEN 325 MG PO TABS
650.0000 mg | ORAL_TABLET | Freq: Once | ORAL | Status: AC
  Administered 2022-08-02: 650 mg via ORAL
  Filled 2022-08-02: qty 2

## 2022-08-02 MED ORDER — LACTATED RINGERS IV BOLUS
2000.0000 mL | Freq: Once | INTRAVENOUS | Status: AC
  Administered 2022-08-02: 2000 mL via INTRAVENOUS

## 2022-08-02 MED ORDER — BUPROPION HCL ER (XL) 300 MG PO TB24
300.0000 mg | ORAL_TABLET | Freq: Every day | ORAL | Status: DC
  Administered 2022-08-03 – 2022-08-05 (×3): 300 mg via ORAL
  Filled 2022-08-02 (×3): qty 1

## 2022-08-02 MED ORDER — IOHEXOL 300 MG/ML  SOLN
100.0000 mL | Freq: Once | INTRAMUSCULAR | Status: AC | PRN
  Administered 2022-08-02: 100 mL via INTRAVENOUS

## 2022-08-02 MED ORDER — CIPROFLOXACIN IN D5W 400 MG/200ML IV SOLN
400.0000 mg | Freq: Two times a day (BID) | INTRAVENOUS | Status: DC
  Administered 2022-08-02 – 2022-08-05 (×6): 400 mg via INTRAVENOUS
  Filled 2022-08-02 (×6): qty 200

## 2022-08-02 MED ORDER — TRAZODONE HCL 50 MG PO TABS
25.0000 mg | ORAL_TABLET | Freq: Every evening | ORAL | Status: DC | PRN
  Administered 2022-08-02 – 2022-08-04 (×3): 25 mg via ORAL
  Filled 2022-08-02 (×3): qty 1

## 2022-08-02 MED ORDER — IBUPROFEN 200 MG PO TABS
ORAL_TABLET | ORAL | Status: AC
  Filled 2022-08-02: qty 2

## 2022-08-02 MED ORDER — DOCUSATE SODIUM 100 MG PO CAPS
100.0000 mg | ORAL_CAPSULE | Freq: Every evening | ORAL | Status: DC
  Administered 2022-08-02 – 2022-08-04 (×3): 100 mg via ORAL
  Filled 2022-08-02 (×3): qty 1

## 2022-08-02 MED ORDER — FLUTICASONE PROPIONATE 50 MCG/ACT NA SUSP
2.0000 | Freq: Every day | NASAL | Status: DC | PRN
  Filled 2022-08-02: qty 16

## 2022-08-02 MED ORDER — CIPROFLOXACIN IN D5W 400 MG/200ML IV SOLN
400.0000 mg | Freq: Once | INTRAVENOUS | Status: AC
  Administered 2022-08-02: 400 mg via INTRAVENOUS
  Filled 2022-08-02: qty 200

## 2022-08-02 MED ORDER — SODIUM CHLORIDE 0.9 % IV SOLN: INTRAVENOUS | Status: DC

## 2022-08-02 MED ORDER — ONDANSETRON HCL 4 MG PO TABS: 4.0000 mg | ORAL_TABLET | Freq: Four times a day (QID) | ORAL | Status: DC | PRN

## 2022-08-02 MED ORDER — IRBESARTAN 300 MG PO TABS
300.0000 mg | ORAL_TABLET | Freq: Every day | ORAL | Status: DC
  Administered 2022-08-03 – 2022-08-05 (×3): 300 mg via ORAL
  Filled 2022-08-02 (×3): qty 1

## 2022-08-02 NOTE — ED Notes (Signed)
MD at bedside. 

## 2022-08-02 NOTE — ED Notes (Signed)
Icepacks applied to patient.

## 2022-08-02 NOTE — ED Provider Notes (Signed)
[place] EMERGENCY DEPARTMENT AT [facility] Provider Note   CSN: [id number] Arrival date & time: 08/02/22  4098     History  Chief Complaint  Patient presents with   Urinary Frequency   Dysuria    Colleen Zuniga is a 62 y.o. female.  With a history of IDDM1, anxiety, depression, GERD, hypothyroidism who presents to the ED for evaluation of dysuria, abdominal pain, nausea, vomiting.  She states she forgot to test her insulin pump on Wednesday which caused her blood sugar to read "high" for the majority of the day that day.  The next day she performed vigorous yard work which caused her to feel sick the rest of the day.  Yesterday she developed nausea, vomiting and cloudy urine.  Sent her urine into her PCP to be tested as she just recently had a UTI.  States that her urine was consistent with UTI and was started on a 7-day course of Macrobid.  Overnight she developed chills, night sweats and rigors as well as bilious emesis.  She has had 1 episode of emesis today.  She was encouraged to come to the ED for further evaluation yesterday by her PCP but did not want to at that time.  Currently denies subjective fever, chills, nausea, dysuria, frequency, urgency.  She is complaining of generalized abdominal pain and bilateral low back pain, left worse than right.   Urinary Frequency Associated symptoms include abdominal pain.  Dysuria Associated symptoms: abdominal pain, nausea and vomiting        Home Medications Prior to Admission medications   Medication Sig Start Date End Date Taking? Authorizing Provider  acetaminophen (TYLENOL) 500 MG tablet Take 1,000 mg by mouth in the morning, at noon, and at bedtime.    [provider]  albuterol (VENTOLIN HFA) 108 (90 Base) MCG/ACT inhaler Inhale 2 puffs into the lungs every 4 (four) hours as needed for shortness of breath or wheezing. 04/08/19   [provider]  ALPRAZolam Prudy Feeler) 0.5 MG tablet Take 0.5 mg by  mouth every 6 (six) hours as needed for sleep or anxiety. 05/25/16   [provider]  atorvastatin (LIPITOR) 10 MG tablet Take 10 mg by mouth daily after supper.    [provider]  BAQSIMI TWO PACK 3 MG/DOSE POWD Place 3 mg into the nose as needed (hypoglycemia). 06/08/19   [provider]  buPROPion (WELLBUTRIN XL) 300 MG 24 hr tablet Take 300 mg by mouth daily after breakfast.    [provider]  Calcium Carb-Cholecalciferol (CALCIUM 500 + D PO) Take 2 tablets by mouth 2 (two) times daily.    [provider]  Continuous Glucose Monitor DEVI 1 Device by Other route continuous.    [provider]  diclofenac Sodium (VOLTAREN) 1 % GEL Apply 2 g topically daily as needed (pain).    [provider]  docusate sodium (COLACE) 100 MG capsule Take 100 mg by mouth every evening.    [provider]  escitalopram (LEXAPRO) 10 MG tablet Take 1 tablet (10 mg total) by mouth at bedtime. 04/29/22   Bowen, Scot Jun, DO  ferrous sulfate 325 (65 FE) MG tablet Take 325 mg by mouth at bedtime.    [provider]  fluticasone (FLONASE) 50 MCG/ACT nasal spray Place 2 sprays into both nostrils daily as needed for allergies.    [provider]  gabapentin (NEURONTIN) 300 MG capsule Take 300 mg by mouth 4 (four) times daily.  [provider]  insulin aspart (NOVOLOG) 100 UNIT/ML injection Continuous with insulin pump    [provider]  Insulin Human (INSULIN PUMP) SOLN Inject into the skin continuous. Novolog    [provider]  irbesartan (AVAPRO) 300 MG tablet Take 300 mg by mouth daily after breakfast.    [provider]  levocetirizine (XYZAL) 5 MG tablet Take 5 mg by mouth daily.    [provider]  levothyroxine (SYNTHROID) 88 MCG tablet Take 88 mcg by mouth daily before breakfast.    [provider]  Magnesium 250 MG TABS Take 500 mg by mouth at bedtime.    [provider]  methocarbamol (ROBAXIN) 500 MG tablet Take 1 tablet (500 mg total) by mouth every 6 (six) hours as needed for muscle spasms. 04/01/22   Eartha Inch, PA  metoCLOPramide (REGLAN) 5 MG tablet Take 5 mg by mouth every 6 (six) hours as needed for nausea.    [provider]  Multiple Vitamins-Minerals (ADULT GUMMY PO) Take 2 tablets by mouth daily.    [provider]  Multiple Vitamins-Minerals (AIRBORNE PO) Take 2 tablets by mouth daily.    [provider]  ondansetron (ZOFRAN-ODT) 8 MG disintegrating tablet Take 1 tablet (8 mg total) by mouth every 8 (eight) hours as needed for nausea or vomiting. 06/16/21   Lorre Nick, MD  oxyCODONE (OXY IR/ROXICODONE) 5 MG immediate release tablet Take 1-2 tablets (5-10 mg total) by mouth every 6 (six) hours as needed for severe pain. 04/01/22   Eartha Inch, PA  Polyvinyl Alcohol-Povidone (REFRESH OP) Place 1 drop into both eyes in the morning and at bedtime.    [provider]  potassium gluconate 595 (99 K) MG TABS tablet Take 595 mg by mouth daily after breakfast.    [provider]  Probiotic Product (FORTIFY DAILY PROBIOTIC) CAPS Take 1 capsule by mouth daily.    [provider]  tirzepatide Greggory Keen) 5 MG/0.5ML Pen Inject 5 mg into the skin every Wednesday. 11/20/21   [provider]  traMADol (ULTRAM) 50 MG tablet Take 1-2 tablets (50-100 mg total) by mouth every 6 (six) hours as needed. 04/01/22   Eartha Inch, PA  vitamin C (ASCORBIC ACID) 250 MG tablet Take 500 mg by mouth 2 (two) times daily.    [provider]      Allergies    Penicillins, Cefdinir, Hydrocodone-acetaminophen, Oxycodone hcl, and Adhesive [tape]    Review of Systems   Review of Systems  Gastrointestinal:  Positive for abdominal pain, nausea and vomiting.  Genitourinary:  Positive for dysuria and frequency.  All other systems reviewed and are negative.   Physical Exam Updated  Vital Signs BP (!) 141/61 (BP Location: Left Arm)   Pulse 83   Temp 99.6 F (37.6 C) (Oral)   Resp 20   Ht 5\' 2"  (1.575 m)   Wt 70.3 kg   LMP  (LMP Unknown)   SpO2 100%   BMI 28.35 kg/m  Physical Exam Vitals and nursing note reviewed.  Constitutional:      General: She is not in acute distress.    Appearance: Normal appearance. She is well-developed. She is not ill-appearing, toxic-appearing or diaphoretic.     Comments: Resting comfortably in bed  HENT:     Head: Normocephalic and atraumatic.     Mouth/Throat:     Mouth: Mucous membranes are dry.     Pharynx: Oropharynx is clear.  Eyes:     Conjunctiva/sclera: Conjunctivae  normal.  Cardiovascular:     Rate and Rhythm: Normal rate and regular rhythm.     Heart sounds: No murmur heard. Pulmonary:     Effort: Pulmonary effort is normal. No respiratory distress.     Breath sounds: Normal breath sounds. No wheezing, rhonchi or rales.  Abdominal:     Palpations: Abdomen is soft.     Tenderness: There is abdominal tenderness (generalized). There is no right CVA tenderness, left CVA tenderness or guarding.  Musculoskeletal:        General: No swelling.     Cervical back: Neck supple.  Skin:    General: Skin is warm and dry.     Capillary Refill: Capillary refill takes less than 2 seconds.  Neurological:     General: No focal deficit present.     Mental Status: She is alert and oriented to person, place, and time.  Psychiatric:        Mood and Affect: Mood normal.        Behavior: Behavior normal.     ED Results / Procedures / Treatments   Labs (all labs ordered are listed, but only abnormal results are displayed) Labs Reviewed  COMPREHENSIVE METABOLIC PANEL - Abnormal; Notable for the following components:      Result Value   Sodium 131 (*)    Glucose, Bld 105 (*)    Total Protein 6.4 (*)    AST 78 (*)    ALT 63 (*)    Total Bilirubin 1.9 (*)    All other components within normal limits  CBC WITH  DIFFERENTIAL/PLATELET - Abnormal; Notable for the following components:   WBC 11.0 (*)    RBC 3.50 (*)    Hemoglobin 11.5 (*)    HCT 34.0 (*)    Neutro Abs 9.4 (*)    Lymphs Abs 0.6 (*)    All other components within normal limits  URINALYSIS, W/ REFLEX TO CULTURE (INFECTION SUSPECTED) - Abnormal; Notable for the following components:   Ketones, ur 5 (*)    Protein, ur 100 (*)    Leukocytes,Ua MODERATE (*)    Bacteria, UA RARE (*)    Non Squamous Epithelial 0-5 (*)    All other components within normal limits  BLOOD GAS, VENOUS - Abnormal; Notable for the following components:   pH, Ven 7.44 (*)    pCO2, Ven 43 (*)    Bicarbonate 29.2 (*)    Acid-Base Excess 4.5 (*)    All other components within normal limits  CBG MONITORING, ED - Abnormal; Notable for the following components:   Glucose-Capillary 106 (*)    All other components within normal limits  URINE CULTURE  LIPASE, BLOOD  LACTIC ACID, PLASMA  LACTIC ACID, PLASMA  HEPATITIS PANEL, ACUTE    EKG None  Radiology US Abdomen Limited RUQ (LIVER/GB)  Result Date: 08/02/2022 CLINICAL DATA:  Transaminitis. EXAM: ULTRASOUND ABDOMEN LIMITED RIGHT UPPER QUADRANT COMPARISON:  CT AP from earlier today FINDINGS: Gallbladder: No gallstones or wall thickening visualized. No sonographic Murphy sign noted by sonographer. Common bile duct: Diameter: 6 mm. Liver: No focal lesion identified. Within normal limits in parenchymal echogenicity. Portal vein is patent on color Doppler imaging with normal direction of blood flow towards the liver. Other: None. IMPRESSION: Normal right upper quadrant sonogram. Electronically Signed   By: Signa Kell M.D.   On: 08/02/2022 12:32   CT ABDOMEN PELVIS W CONTRAST  Result Date: 08/02/2022 CLINICAL DATA:  Abdominal pain and possible UTI for 3 days.  EXAM: CT ABDOMEN AND PELVIS WITH CONTRAST TECHNIQUE: Multidetector CT imaging of the abdomen and pelvis was performed using the standard protocol following  bolus administration of intravenous contrast. RADIATION DOSE REDUCTION: This exam was performed according to the departmental dose-optimization program which includes automated exposure control, adjustment of the mA and/or kV according to patient size and/or use of iterative reconstruction technique. CONTRAST:  OMNIPAQUE IOHEXOL 300 MG/ML  SOLN COMPARISON:  06/16/2021 FINDINGS: Lower chest: No acute abnormality. Hepatobiliary: Hemangioma in the lateral segment of left hepatic lobe is unchanged measuring 1.2 cm, image 17/2. No suspicious liver lesion identified. Gallbladder appears normal. No bile duct dilatation. Pancreas: Unremarkable. No pancreatic ductal dilatation or surrounding inflammatory changes. Spleen: Choose 1 Adrenals/Urinary Tract: Normal adrenal glands. There is mild perinephric soft tissue stranding about the inferior pole of both kidneys. Equivocal striated nephrogram on the delayed images within the left kidney. No mass or hydronephrosis identified. No nephrolithiasis. Urinary bladder appears normal. Stomach/Bowel: Small hiatal hernia. Stomach is nondistended. No pathologic dilatation of the large or small bowel loops. Moderate retained stool identified within the colon. No bowel wall thickening or inflammation identified. The appendix is visualized and appears normal. Vascular/Lymphatic: Aortic atherosclerosis. No aneurysm. No signs of retroperitoneal, mesenteric or pelvic adenopathy. Reproductive: Uterus and bilateral adnexa are unremarkable. Other: Small fat containing umbilical hernia. No abdominopelvic ascites. Musculoskeletal: Thoracolumbar scoliosis deformity identified. Status post posterior hardware fixation of L1 through the sacrum. Solid fusion of the L1 through L5 vertebra identified. Marked degenerative disc disease is noted at T12-L1. IMPRESSION: 1. Mild perinephric soft tissue stranding about the inferior pole of both kidneys. Equivocal striated nephrogram on the delayed images  within the left kidney. Correlate for any clinical signs or symptoms of pyelonephritis. 2. Moderate retained stool identified within the colon. Correlate for any clinical signs or symptoms of constipation. 3. Small hiatal hernia. 4. Small fat containing umbilical hernia. 5. Thoracolumbar scoliosis deformity status post posterior hardware fixation of L1 through the sacrum. Solid fusion of the L1 through L5 vertebra identified. Marked degenerative disc disease is noted at T12-L1. 6.  Aortic Atherosclerosis (ICD10-I70.0). Electronically Signed   By: Signa Kell M.D.   On: 08/02/2022 11:44   DG Chest 2 View  Result Date: 08/02/2022 CLINICAL DATA:  Suspected sepsis.  Dehydration and possible UTI. EXAM: CHEST - 2 VIEW COMPARISON:  None Available. FINDINGS: Normal heart size and mediastinal contours. No pleural fluid or airspace disease. No signs of interstitial edema. Thoracolumbar scoliosis with spinal fixation hardware noted in the lumbar spine. IMPRESSION: No active cardiopulmonary abnormalities. Electronically Signed   By: Signa Kell M.D.   On: 08/02/2022 09:38    Procedures Procedures    Medications Ordered in ED Medications  ciprofloxacin (CIPRO) IVPB 400 mg (400 mg Intravenous New Bag/Given 08/02/22 1235)  lactated ringers bolus 2,000 mL (2,000 mLs Intravenous New Bag/Given 08/02/22 0906)  acetaminophen (TYLENOL) tablet 650 mg (650 mg Oral Given 08/02/22 0958)  iohexol (OMNIPAQUE) 300 MG/ML solution 100 mL (100 mLs Intravenous Contrast Given 08/02/22 1123)    ED Course/ Medical Decision Making/ A&P Clinical Course as of 08/02/22 1333  Sat Aug 02, 2022  1110 On reevaluation patient states that she still feels unwell.  Will obtain right upper quadrant ultrasound given transaminitis and CT abdomen pelvis given recurrent complicated UTI [AS]    Clinical Course User Index [AS] Lula Olszewski Edsel Petrin, PA-C  Medical Decision Making Amount and/or Complexity of Data  Reviewed Labs: ordered. Radiology: ordered.  Risk OTC drugs.  This patient presents to the ED for concern of nausea, vomiting, cloudy urine, this involves an extensive number of treatment options, and is a complaint that carries with it a high risk of complications and morbidity. The emergent differential diagnosis for vomiting includes, but is not limited to ACS/MI, DKA, Ischemic bowel, Meningitis, Sepsis, Acute gastric dilation, Adrenal insufficiency, Appendicitis,  Bowel obstruction/ileus, Carbon monoxide poisoning, Cholecystitis, Electrolyte abnormalities, Elevated ICP, Gastric outlet obstruction, Pancreatitis, Ruptured viscus, Biliary colic, Cannabinoid hyperemesis syndrome, Gastritis, Gastroenteritis, Gastroparesis,  Narcotic withdrawal, Peptic ulcer disease, and UTI   Co morbidities that complicate the patient evaluation   IDDM1, anxiety, depression, GERD, hypothyroidism  My initial workup includes labs, imaging, fluids  Additional history obtained from: Nursing notes from this visit. Friend is at bedside and provides a portion of the history EMS provides a portion of the history  I ordered, reviewed and interpreted labs which include: CBC, CMP, lipase, urinalysis, VBG.  CMP with hyponatremia of 131, hyperglycemia 105, AST of 78, ALT of 63, total bilirubin 1.9.  CBC with mild leukocytosis of 11, stable anemia with hemoglobin of 11.5.  Urinalysis with moderate leukocytes, greater than 50 white blood cells, rare bacteria  I ordered imaging studies including chest x-ray, CT abdomen pelvis, right upper quadrant ultrasound. I independently visualized and interpreted imaging which showed chest x-ray normal.  CT abdomen pelvis consistent with pyelonephritis, left worse than right.  Right upper quadrant ultrasound was normal I agree with the radiologist interpretation  Cardiac Monitoring:  The patient was maintained on a cardiac monitor.  I personally viewed and interpreted the cardiac  monitored which showed an underlying rhythm of: NSR  Consultations Obtained:  I requested consultation with the hospitalist Dr. Erenest Blank,  and discussed lab and imaging findings as well as pertinent plan - they recommend: admission  Initially febrile, otherwise hemodynamically stable.  62 year old female presenting to the ED for evaluation of generalized abdominal pain, bilateral low back pain with left worse than right, nausea and vomiting.  Initially thought her symptoms were secondary to her hyperglycemia as she had elevated readings approximately 3 days ago.  She had some cloudy urine yesterday.  Was started on Macrobid for the second time in the past 2 weeks for UTI.  On exam she appears dry but overall very well.  She has mild generalized abdominal TTP.  Lab workup significant for mild leukocytosis of 11, stable anemia with a hemoglobin of 11.5, hyponatremia 131, elevated LFTs, urinalysis was consistent with UTI.  Patient reports still feeling poorly after fluid resuscitation.  Symptoms likely secondary to pyelonephritis.  Given that she has failed outpatient therapy, will admit for IV antibiotics for pyelonephritis.  Patient's case discussed with Dr. Wilkie Aye who agrees with plan to admit.   Note: Portions of this report may have been transcribed using voice recognition software. Every effort was made to ensure accuracy; however, inadvertent computerized transcription errors may still be present.        Final Clinical Impression(s) / ED Diagnoses Final diagnoses:  Pyelonephritis    Rx / DC Orders ED Discharge Orders     None         Michelle Piper, PA-C 08/02/22 1333    Horton, Clabe Seal, DO 08/02/22 1516

## 2022-08-02 NOTE — ED Notes (Signed)
Called lab. States will start run lactic

## 2022-08-02 NOTE — Progress Notes (Signed)
Received patient from the ED. Patient admitted for sepsis. Patient is awake, alert, orientx4. C/O headache due to nausea and vomiting; room lights dim. Received patient with glucose monitor and insulin pump.  08/02/22 1629  Vitals  Temp 98.9 F (37.2 C)  Temp Source Oral  BP (!) 130/47  MAP (mmHg) 69  BP Location Left Arm  BP Method Automatic  Patient Position (if appropriate) Lying  Pulse Rate 91  Pulse Rate Source Monitor  Resp 16  MEWS COLOR  MEWS Score Color Green  Oxygen Therapy  SpO2 97 %  O2 Device Room Air  MEWS Score  MEWS Temp 0  MEWS Systolic 0  MEWS Pulse 0  MEWS RR 0  MEWS LOC 0  MEWS Score 0

## 2022-08-02 NOTE — ED Notes (Signed)
pt vomited the motrin. Visitor at bedside said she is putting the blankets back on the patient after removed by this RN explaining she has a fever. However, they insist and said  because the doctor told them they are ok to do so. MD informed via epic secure chat

## 2022-08-02 NOTE — H&P (Addendum)
History and Physical  Colleen Zuniga:[id number] DOB: 26-Jun-1960 DOA: 08/02/2022  PCP: Chilton Greathouse, MD   Chief Complaint: Back pain, nausea  HPI: Colleen Zuniga is a 62 y.o. female with medical history significant for insulin-dependent type 1 diabetes with insulin pump, anxiety, depression, GERD, hypothyroidism presents to the hospital for evaluation of dysuria, back pain, vomiting and being admitted to the hospital with sepsis due to pyelonephritis.  3 weeks ago, her PCP treated for UTI with a course of oral Macrobid.  She felt that she got better.  3 days ago, she forgot to place her insulin pump, her monitor was reading high for the majority of the day that day.  Yesterday she developed nausea, vomiting and cloudy urine.  Her PCP started her again on oral Macrobid after due to obtaining a urine culture but she came to the hospital since she had a fever at home, as well as nausea and vomiting  ED Course: In the emergency department, she had a fever of 100.4, otherwise hemodynamically stable.  Lab work showed blood sugar 104, WBC 11, hemoglobin stable at 12.  Electrolytes and renal function normal, but LFTs are elevated AST/ALT is 78/63.  Total bilirubin 1.9.  She denies abdominal pain.  She was given IV fluids, dose of IV ciprofloxacin (due to severe penicillin allergy) and hospitalist was contacted for admission.  Review of Systems: Please see HPI for pertinent positives and negatives. A complete 10 system review of systems are otherwise negative.  Past Medical History:  Diagnosis Date   Anemia    Anxiety    Arthritis    Back pain    Back pain    De Quervain's tenosynovitis, right 01/2012   Degenerative disc disease, cervical    states neck is stiff, reduced range of motion   Dental crowns present    Depression    Diabetes mellitus    Insulin pump   GERD (gastroesophageal reflux disease)    Heart murmur    states has a functional murmur, and that she has never had any  problems   Hypothyroidism    Joint pain    Osteoarthritis    PONV (postoperative nausea and vomiting)    Past Surgical History:  Procedure Laterality Date   ANTERIOR CERVICAL DISCECTOMY  02/20/2016   ANTERIOR LAT LUMBAR FUSION N/A 08/18/2016   Procedure: ANTERIOR LATERAL LUMBAR FUSION LUMBAR TWO- LUMBAR THREE, LUMBAR THREE- LUMBAR FOUR, LUMBAR FOUR- LUMBAR FIVE; LUMBAR TWO- LUMBAR FIVE PEDICLE SCREW FIXATION;  Surgeon: Shirlean Kelly, MD;  Location: [facility] OR;  Service: Neurosurgery;  Laterality: N/A;  ANTERIOR LATERAL LUMBAR FUSION LUMBAR 2- LUMBAR 3, LUMBAR 3- LUMBAR 4-, LUMBAR 4- LUMBAR 5; LUMBAR 2- LUMBAR 5 PEDICLE SCREW FIXATION   ANTERIOR LAT LUMBAR FUSION N/A 08/05/2019   Procedure: ANTERIOR LATERAL LUMBAR INTERBODY FUSION LUMBAR ONE-TWO.;  Surgeon: Donalee Citrin, MD;  Location: [facility] OR;  Service: Neurosurgery;  Laterality: N/A;  anterolateral   APPLICATION OF INTRAOPERATIVE CT SCAN N/A 08/05/2019   Procedure: APPLICATION OF INTRAOPERATIVE CT SCAN;  Surgeon: Donalee Citrin, MD;  Location: [facility] OR;  Service: Neurosurgery;  Laterality: N/A;   BACK SURGERY  07/2018   plif l5-s1   Cataracts Bilateral    CESAREAN SECTION     DORSAL COMPARTMENT RELEASE  02/08/2002   first dorsal compartment left wrist   DORSAL COMPARTMENT RELEASE  02/03/2012   Procedure: RELEASE DORSAL COMPARTMENT (DEQUERVAIN);  Surgeon: Nicki Reaper, MD;  Location: [facility];  Service: Orthopedics;  Laterality: Right;  RELEASE DEQUERVAINS RIGHT WRIST   KNEE ARTHROSCOPY  10/14/2004   right   LAMINECTOMY WITH POSTERIOR LATERAL ARTHRODESIS LEVEL 1 N/A 08/05/2019   Procedure: Posterior lateral fusion - Lumbar Five-Sacral One with pelvic fixation with iliac screws and Nuvasive instrumentation;  Surgeon: Donalee Citrin, MD;  Location: [facility] OR;  Service: Neurosurgery;  Laterality: N/A;  posterior   SHOULDER ARTHROSCOPY  05/05/2005   left   TOTAL KNEE ARTHROPLASTY Left 12/23/2021   Procedure: TOTAL KNEE ARTHROPLASTY;  Surgeon: Ollen Gross, MD;  Location: [facility] ORS;  Service: Orthopedics;  Laterality: Left;   TOTAL KNEE ARTHROPLASTY Right 03/31/2022   Procedure: TOTAL KNEE ARTHROPLASTY;  Surgeon: Ollen Gross, MD;  Location: [facility] ORS;  Service: Orthopedics;  Laterality: Right;   TRIGGER FINGER RELEASE  01/07/2011   release A1 pulley left thumb   TRIGGER FINGER RELEASE     x 5 other fingers    Social History:  reports that she quit smoking about 31 years ago. Her smoking use included cigarettes. She has never used smokeless tobacco. She reports that she does not currently use alcohol. She reports that she does not use drugs.   Allergies  Allergen Reactions   Penicillins Anaphylaxis and Other (See Comments)    CLOSES THROAT PATIENT HAD A PCN REACTION WITH IMMEDIATE RASH, FACIAL/TONGUE/THROAT SWELLING, SOB, OR LIGHTHEADEDNESS WITH HYPOTENSION:  #  #  #  YES  #  #  #   Has patient had a PCN reaction causing severe rash involving mucus membranes or skin necrosis: UNKNOWN  Has patient had a PCN reaction that required hospitalization:No Has patient had a PCN reaction occurring within the last 10 years:No    Cefdinir Diarrhea and Nausea And Vomiting   Hydrocodone-Acetaminophen Nausea And Vomiting    Nausea/vomiting with or without food   Oxycodone Hcl Nausea Only   Adhesive [Tape] Itching and Rash    Family History  Problem Relation Age of Onset   Heart disease Mother    Stroke Father    Diabetes Father    Heart disease Father    Kidney disease Father    Obesity Father    Colon cancer Neg Hx    Esophageal cancer Neg Hx    Rectal cancer Neg Hx    Stomach cancer Neg Hx      Prior to Admission medications   Medication Sig Start Date End Date Taking? Authorizing Provider  acetaminophen (TYLENOL) 500 MG tablet Take 1,000 mg by mouth in the morning, at noon, and at bedtime.    [provider]  albuterol (VENTOLIN HFA) 108 (90 Base) MCG/ACT inhaler Inhale 2 puffs into the lungs every 4 (four) hours as needed  for shortness of breath or wheezing. 04/08/19   [provider]  ALPRAZolam Prudy Feeler) 0.5 MG tablet Take 0.5 mg by mouth every 6 (six) hours as needed for sleep or anxiety. 05/25/16   [provider]  atorvastatin (LIPITOR) 10 MG tablet Take 10 mg by mouth daily after supper.    [provider]  BAQSIMI TWO PACK 3 MG/DOSE POWD Place 3 mg into the nose as needed (hypoglycemia). 06/08/19   [provider]  buPROPion (WELLBUTRIN XL) 300 MG 24 hr tablet Take 300 mg by mouth daily after breakfast.    [provider]  Calcium Carb-Cholecalciferol (CALCIUM 500 + D PO) Take 2 tablets by mouth 2 (two) times daily.    [provider]  Continuous Glucose Monitor DEVI 1 Device by Other route continuous.    [provider]  diclofenac Sodium (VOLTAREN) 1 % GEL Apply 2 g topically daily as needed (pain).    [provider]  docusate sodium (COLACE) 100 MG capsule Take 100 mg by mouth every evening.    [provider]  escitalopram (LEXAPRO) 10 MG tablet Take 1 tablet (10 mg total) by mouth at bedtime. 04/29/22   Bowen, Scot Jun, DO  ferrous sulfate 325 (65 FE) MG tablet Take 325 mg by mouth at bedtime.    [provider]  fluticasone (FLONASE) 50 MCG/ACT nasal spray Place 2 sprays into both nostrils daily as needed for allergies.    [provider]  gabapentin (NEURONTIN) 300 MG capsule Take 300 mg by mouth 4 (four) times daily.     [provider]  insulin aspart (NOVOLOG) 100 UNIT/ML injection Continuous with insulin pump    [provider]  Insulin Human (INSULIN PUMP) SOLN Inject into the skin continuous. Novolog    [provider]  irbesartan (AVAPRO) 300 MG tablet Take 300 mg by mouth daily after breakfast.    [provider]  levocetirizine (XYZAL) 5 MG tablet Take 5 mg by mouth daily.    [provider]  levothyroxine (SYNTHROID) 88 MCG tablet Take 88 mcg by mouth daily  before breakfast.    [provider]  Magnesium 250 MG TABS Take 500 mg by mouth at bedtime.    [provider]  methocarbamol (ROBAXIN) 500 MG tablet Take 1 tablet (500 mg total) by mouth every 6 (six) hours as needed for muscle spasms. 04/01/22   Eartha Inch, PA  metoCLOPramide (REGLAN) 5 MG tablet Take 5 mg by mouth every 6 (six) hours as needed for nausea.    [provider]  Multiple Vitamins-Minerals (ADULT GUMMY PO) Take 2 tablets by mouth daily.    [provider]  Multiple Vitamins-Minerals (AIRBORNE PO) Take 2 tablets by mouth daily.    [provider]  ondansetron (ZOFRAN-ODT) 8 MG disintegrating tablet Take 1 tablet (8 mg total) by mouth every 8 (eight) hours as needed for nausea or vomiting. 06/16/21   Lorre Nick, MD  oxyCODONE (OXY IR/ROXICODONE) 5 MG immediate release tablet Take 1-2 tablets (5-10 mg total) by mouth every 6 (six) hours as needed for severe pain. 04/01/22   Eartha Inch, PA  Polyvinyl Alcohol-Povidone (REFRESH OP) Place 1 drop into both eyes in the morning and at bedtime.    [provider]  potassium gluconate 595 (99 K) MG TABS tablet Take 595 mg by mouth daily after breakfast.    [provider]  Probiotic Product (FORTIFY DAILY PROBIOTIC) CAPS Take 1 capsule by mouth daily.    [provider]  tirzepatide Greggory Keen) 5 MG/0.5ML Pen Inject 5 mg into the skin every Wednesday. 11/20/21   [provider]  traMADol (ULTRAM) 50 MG tablet Take 1-2 tablets (50-100 mg total) by mouth every 6 (six) hours as needed. 04/01/22   Eartha Inch, PA  vitamin C (ASCORBIC ACID) 250 MG tablet Take 500 mg by mouth 2 (two) times daily.    [provider]    Physical Exam: BP (!) 141/61 (BP Location: Left Arm)   Pulse 83   Temp 99.6 F (37.6 C) (Oral)   Resp 20   Ht 5\' 2"  (1.575 m)   Wt 70.3 kg   LMP  (LMP Unknown)   SpO2 100%   BMI 28.35 kg/m   General:  Alert,  oriented,  pleasant and cooperative, having shaking chills.  She just vomited once again. Eyes: EOMI, clear conjuctivae, white sclerea Neck: supple, no masses, trachea mildline  Cardiovascular: RRR, no murmurs or rubs, no peripheral edema  Respiratory: clear to auscultation bilaterally, no wheezes, no crackles  Abdomen: soft, nontender, nondistended, normal bowel tones heard  Skin: dry, no rashes  Musculoskeletal: no joint effusions, normal range of motion  Psychiatric: appropriate affect, normal speech  Neurologic: extraocular muscles intact, clear speech, moving all extremities with intact sensorium          Labs on Admission:  Basic Metabolic Panel: Recent Labs  Lab 08/02/22 0819  NA 131*  K 3.6  CL 98  CO2 22  GLUCOSE 105*  BUN 13  CREATININE 0.82  CALCIUM 9.1   Liver Function Tests: Recent Labs  Lab 08/02/22 0819  AST 78*  ALT 63*  ALKPHOS 115  BILITOT 1.9*  PROT 6.4*  ALBUMIN 3.6   Recent Labs  Lab 08/02/22 0819  LIPASE 31   No results for input(s): "AMMONIA" in the last 168 hours. CBC: Recent Labs  Lab 08/02/22 0819  WBC 11.0*  NEUTROABS 9.4*  HGB 11.5*  HCT 34.0*  MCV 97.1  PLT 171   Cardiac Enzymes: No results for input(s): "CKTOTAL", "CKMB", "CKMBINDEX", "TROPONINI" in the last 168 hours.  BNP (last 3 results) No results for input(s): "BNP" in the last 8760 hours.  ProBNP (last 3 results) No results for input(s): "PROBNP" in the last 8760 hours.  CBG: Recent Labs  Lab 08/02/22 0805  GLUCAP 106*    Radiological Exams on Admission: US Abdomen Limited RUQ (LIVER/GB)  Result Date: 08/02/2022 CLINICAL DATA:  Transaminitis. EXAM: ULTRASOUND ABDOMEN LIMITED RIGHT UPPER QUADRANT COMPARISON:  CT AP from earlier today FINDINGS: Gallbladder: No gallstones or wall thickening visualized. No sonographic Murphy sign noted by sonographer. Common bile duct: Diameter: 6 mm. Liver: No focal lesion identified. Within normal limits in parenchymal echogenicity.  Portal vein is patent on color Doppler imaging with normal direction of blood flow towards the liver. Other: None. IMPRESSION: Normal right upper quadrant sonogram. Electronically Signed   By: Signa Kell M.D.   On: 08/02/2022 12:32   CT ABDOMEN PELVIS W CONTRAST  Result Date: 08/02/2022 CLINICAL DATA:  Abdominal pain and possible UTI for 3 days. EXAM: CT ABDOMEN AND PELVIS WITH CONTRAST TECHNIQUE: Multidetector CT imaging of the abdomen and pelvis was performed using the standard protocol following bolus administration of intravenous contrast. RADIATION DOSE REDUCTION: This exam was performed according to the departmental dose-optimization program which includes automated exposure control, adjustment of the mA and/or kV according to patient size and/or use of iterative reconstruction technique. CONTRAST:  OMNIPAQUE IOHEXOL 300 MG/ML  SOLN COMPARISON:  06/16/2021 FINDINGS: Lower chest: No acute abnormality. Hepatobiliary: Hemangioma in the lateral segment of left hepatic lobe is unchanged measuring 1.2 cm, image 17/2. No suspicious liver lesion identified. Gallbladder appears normal. No bile duct dilatation. Pancreas: Unremarkable. No pancreatic ductal dilatation or surrounding inflammatory changes. Spleen: Choose 1 Adrenals/Urinary Tract: Normal adrenal glands. There is mild perinephric soft tissue stranding about the inferior pole of both kidneys. Equivocal striated nephrogram on the delayed images within the left kidney. No mass or hydronephrosis identified. No nephrolithiasis. Urinary bladder appears normal. Stomach/Bowel: Small hiatal hernia. Stomach is nondistended. No pathologic dilatation of the large or small bowel loops. Moderate retained stool identified within the colon. No bowel wall thickening or inflammation identified. The appendix is visualized and appears normal. Vascular/Lymphatic: Aortic atherosclerosis. No aneurysm. No signs of retroperitoneal, mesenteric or  pelvic adenopathy.  Reproductive: Uterus and bilateral adnexa are unremarkable. Other: Small fat containing umbilical hernia. No abdominopelvic ascites. Musculoskeletal: Thoracolumbar scoliosis deformity identified. Status post posterior hardware fixation of L1 through the sacrum. Solid fusion of the L1 through L5 vertebra identified. Marked degenerative disc disease is noted at T12-L1. IMPRESSION: 1. Mild perinephric soft tissue stranding about the inferior pole of both kidneys. Equivocal striated nephrogram on the delayed images within the left kidney. Correlate for any clinical signs or symptoms of pyelonephritis. 2. Moderate retained stool identified within the colon. Correlate for any clinical signs or symptoms of constipation. 3. Small hiatal hernia. 4. Small fat containing umbilical hernia. 5. Thoracolumbar scoliosis deformity status post posterior hardware fixation of L1 through the sacrum. Solid fusion of the L1 through L5 vertebra identified. Marked degenerative disc disease is noted at T12-L1. 6.  Aortic Atherosclerosis (ICD10-I70.0). Electronically Signed   By: Signa Kell M.D.   On: 08/02/2022 11:44   DG Chest 2 View  Result Date: 08/02/2022 CLINICAL DATA:  Suspected sepsis.  Dehydration and possible UTI. EXAM: CHEST - 2 VIEW COMPARISON:  None Available. FINDINGS: Normal heart size and mediastinal contours. No pleural fluid or airspace disease. No signs of interstitial edema. Thoracolumbar scoliosis with spinal fixation hardware noted in the lumbar spine. IMPRESSION: No active cardiopulmonary abnormalities. Electronically Signed   By: Signa Kell M.D.   On: 08/02/2022 09:38    Assessment/Plan  62 year old female with a history of type 1 diabetes, generalized anxiety, recent outpatient treatment for UTI be admitted to the hospital with failure of outpatient treatment and development of meeting SIRS criteria and suspected sepsis, due to pyelonephritis.  SIRS criteria without sepsis-meeting criteria with  fever, leukocytosis.  Source is UTI/pyelonephritis.  Lactic acid normal -Inpatient admission -Continue IV fluids and supportive care -Empiric IV ciprofloxacin due to penicillin allergy -Follow blood and urine cultures obtained in the ER today    Type 1 diabetes mellitus without complications (HCC)-her diabetes is usually extremely well-controlled, last A1c 5.9.  Blood sugar currently elevated due to acute infection -Diabetic diet when eating - Patient will continue to manage her own insulin pump while in the hospital    GAD (generalized anxiety disorder)-continue Lexapro  Iron deficiency anemia-continue oral iron supplementation    LFTs abnormal-unclear etiology, though the patient does take Tylenol 3 g/day on a routine basis due to her chronic back pain -Right upper quadrant ultrasound unremarkable -Discontinue Tylenol, use ibuprofen instead -Check acute hepatitis panel  DVT prophylaxis: Lovenox     Code Status: Full Code  Consults called: None  Admission status: The appropriate patient status for this patient is INPATIENT. Inpatient status is judged to be reasonable and necessary in order to provide the required intensity of service to ensure the patient's safety. The patient's presenting symptoms, physical exam findings, and initial radiographic and laboratory data in the context of their chronic comorbidities is felt to place them at high risk for further clinical deterioration. Furthermore, it is not anticipated that the patient will be medically stable for discharge from the hospital within 2 midnights of admission.    I certify that at the point of admission it is my clinical judgment that the patient will require inpatient hospital care spanning beyond 2 midnights from the point of admission due to high intensity of service, high risk for further deterioration and high frequency of surveillance required   Time spent: 53 minutes  Dale Ribeiro Sharlette Dense MD [facility] Pager  [phone]  If 7PM-7AM, please contact night-coverage  [email or web address] Password [email or web address]  08/02/2022, 1:35 PM

## 2022-08-02 NOTE — ED Notes (Signed)
Messaged MD. Advised fever is going up, patient has Tylenol already, no other PRN or orders available. Agreed to come see patient

## 2022-08-02 NOTE — ED Notes (Signed)
Called lab for blood gas

## 2022-08-02 NOTE — Progress Notes (Signed)
PHARMACY NOTE:  ANTIMICROBIAL RENAL DOSAGE ADJUSTMENT  Current antimicrobial regimen includes a mismatch between antimicrobial dosage and estimated renal function.  As per policy approved by the Pharmacy & Therapeutics and Medical Executive Committees, the antimicrobial dosage will be adjusted accordingly.  Current antimicrobial dosage:  Ciprofloxacin 400 mg IV q24h  Indication: Pyelonephritis  Renal Function:  Estimated Creatinine Clearance: 64.5 mL/min (by C-G formula based on SCr of 0.82 mg/dL).  Antimicrobial dosage has been changed to:  Ciprofloxacin 400 mg IV q12h  Cindi Carbon, PharmD 08/02/22 3:29 PM

## 2022-08-02 NOTE — ED Notes (Signed)
ED TO INPATIENT HANDOFF REPORT  Name/Age/Gender Colleen Zuniga 62 y.o. female  Code Status Code Status History     Date Active Date Inactive Code Status Order ID Comments User Context   03/31/2022 1145 04/01/2022 2014 Full Code [id number]  Cory Munch, PA-C Inpatient   12/23/2021 1112 12/24/2021 2316 Full Code [id number]  Derenda Fennel, [place] Inpatient   08/05/2019 1739 08/09/2019 2227 Full Code [id number]  Donalee Citrin, MD Inpatient   08/03/2018 1402 08/04/2018 1414 Full Code [id number]  Shirlean Kelly, MD Inpatient   08/18/2016 1759 08/20/2016 1537 Full Code [id number]  Shirlean Kelly, MD Inpatient    Questions for Most Recent Historical Code Status (Order [id number])     Question Answer   By: Consent: discussion documented in EHR            Home/SNF/Other Home  Chief Complaint sugar issues  Level of Care/Admitting Diagnosis ED Disposition     ED Disposition  Admit   Condition  --   Comment  The patient appears reasonably stabilized for admission considering the current resources, flow, and capabilities available in the ED at this time, and I doubt any other [facility] requiring further screening and/or treatment in the ED prior to admission is  present.          Medical History Past Medical History:  Diagnosis Date   Anemia    Anxiety    Arthritis    Back pain    Back pain    De Quervain's tenosynovitis, right 01/2012   Degenerative disc disease, cervical    states neck is stiff, reduced range of motion   Dental crowns present    Depression    Diabetes mellitus    Insulin pump   GERD (gastroesophageal reflux disease)    Heart murmur    states has a functional murmur, and that she has never had any problems   Hypothyroidism    Joint pain    Osteoarthritis    PONV (postoperative nausea and vomiting)     Allergies Allergies  Allergen Reactions   Penicillins Anaphylaxis and Other (See Comments)    CLOSES THROAT PATIENT HAD A PCN REACTION  WITH IMMEDIATE RASH, FACIAL/TONGUE/THROAT SWELLING, SOB, OR LIGHTHEADEDNESS WITH HYPOTENSION:  #  #  #  YES  #  #  #   Has patient had a PCN reaction causing severe rash involving mucus membranes or skin necrosis: UNKNOWN  Has patient had a PCN reaction that required hospitalization:No Has patient had a PCN reaction occurring within the last 10 years:No    Cefdinir Diarrhea and Nausea And Vomiting   Hydrocodone-Acetaminophen Nausea And Vomiting    Nausea/vomiting with or without food   Oxycodone Hcl Nausea Only   Adhesive [Tape] Itching and Rash    IV Location/Drains/Wounds Patient Lines/Drains/Airways Status     Active Line/Drains/Airways     Name Placement date Placement time Site Days   Peripheral IV 08/02/22 20 G Anterior;Left;Proximal Forearm 08/02/22  0851  Forearm  less than 1            Labs/Imaging Results for orders placed or performed during the hospital encounter of 08/02/22 (from the past 48 hour(s))  CBG monitoring, ED     Status: Abnormal   Collection Time: 08/02/22  8:05 AM  Result Value Ref Range   Glucose-Capillary 106 (H) 70 - 99 mg/dL    Comment: Glucose reference range applies only to samples taken after fasting for at least 8 hours.  Comprehensive metabolic panel  Status: Abnormal   Collection Time: 08/02/22  8:19 AM  Result Value Ref Range   Sodium 131 (L) 135 - 145 mmol/L   Potassium 3.6 3.5 - 5.1 mmol/L   Chloride 98 98 - 111 mmol/L   CO2 22 22 - 32 mmol/L   Glucose, Bld 105 (H) 70 - 99 mg/dL    Comment: Glucose reference range applies only to samples taken after fasting for at least 8 hours.   BUN 13 8 - 23 mg/dL   Creatinine, Ser 1.61 0.44 - 1.00 mg/dL   Calcium 9.1 8.9 - 09.6 mg/dL   Total Protein 6.4 (L) 6.5 - 8.1 g/dL   Albumin 3.6 3.5 - 5.0 g/dL   AST 78 (H) 15 - 41 U/L   ALT 63 (H) 0 - 44 U/L   Alkaline Phosphatase 115 38 - 126 U/L   Total Bilirubin 1.9 (H) 0.3 - 1.2 mg/dL   GFR, Estimated >04 >54 mL/min    Comment:  (NOTE) Calculated using the CKD-EPI Creatinine Equation (2021)    Anion gap 11 5 - 15    Comment: Performed at [facility], [address], [place], [place] [address]  CBC with Differential     Status: Abnormal   Collection Time: 08/02/22  8:19 AM  Result Value Ref Range   WBC 11.0 (H) 4.0 - 10.5 K/uL   RBC 3.50 (L) 3.87 - 5.11 MIL/uL   Hemoglobin 11.5 (L) 12.0 - 15.0 g/dL   HCT 91.4 (L) 78.2 - 95.6 %   MCV 97.1 80.0 - 100.0 fL   MCH 32.9 26.0 - 34.0 pg   MCHC 33.8 30.0 - 36.0 g/dL   RDW 21.3 08.6 - 57.8 %   Platelets 171 150 - 400 K/uL   nRBC 0.0 0.0 - 0.2 %   Neutrophils Relative % 86 %   Neutro Abs 9.4 (H) 1.7 - 7.7 K/uL   Lymphocytes Relative 6 %   Lymphs Abs 0.6 (L) 0.7 - 4.0 K/uL   Monocytes Relative 8 %   Monocytes Absolute 0.9 0.1 - 1.0 K/uL   Eosinophils Relative 0 %   Eosinophils Absolute 0.0 0.0 - 0.5 K/uL   Basophils Relative 0 %   Basophils Absolute 0.0 0.0 - 0.1 K/uL   Immature Granulocytes 0 %   Abs Immature Granulocytes 0.04 0.00 - 0.07 K/uL    Comment: Performed at [facility], [address], [place], [place] [address]  Lipase, blood     Status: None   Collection Time: 08/02/22  8:19 AM  Result Value Ref Range   Lipase 31 11 - 51 U/L    Comment: Performed at [facility], [address] [place], [place] [address]  Urinalysis, w/ Reflex to Culture (Infection Suspected) -Urine, Clean Catch     Status: Abnormal   Collection Time: 08/02/22  8:30 AM  Result Value Ref Range   Specimen Source URINE, CATHETERIZED    Color, Urine YELLOW YELLOW   APPearance CLEAR CLEAR   Specific Gravity, Urine 1.016 1.005 - 1.030   pH 6.0 5.0 - 8.0   Glucose, UA NEGATIVE NEGATIVE mg/dL   Hgb urine dipstick NEGATIVE NEGATIVE   Bilirubin Urine NEGATIVE NEGATIVE   Ketones, ur 5 (A) NEGATIVE mg/dL   Protein, ur 132 (A) NEGATIVE mg/dL   Nitrite NEGATIVE NEGATIVE   Leukocytes,Ua MODERATE (A) NEGATIVE   RBC / HPF 6-10 0 -  5 RBC/hpf   WBC, UA >50 0 - 5 WBC/hpf  Comment:        Reflex urine culture not performed if WBC <=10, OR if Squamous epithelial cells >5. If Squamous epithelial cells >5 suggest recollection.    Bacteria, UA RARE (A) NONE SEEN   Squamous Epithelial / HPF 0-5 0 - 5 /HPF   Mucus PRESENT    Hyaline Casts, UA PRESENT    Non Squamous Epithelial 0-5 (A) NONE SEEN    Comment: Performed at [facility], [address] [place], [place] [address]  Urine Culture     Status: None (Preliminary result)   Collection Time: 08/02/22  8:30 AM   Specimen: Urine, Random  Result Value Ref Range   Specimen Description      URINE, RANDOM Performed at [facility], [address] [place], [place] [address]    Special Requests      URINE, CLEAN CATCH Performed at [facility], [address] [place], [place] [address]    Culture PENDING    Report Status PENDING   Blood gas, venous (at [facility] and [facility])     Status: Abnormal   Collection Time: 08/02/22 10:30 AM  Result Value Ref Range   pH, Ven 7.44 (H) 7.25 - 7.43   pCO2, Ven 43 (L) 44 - 60 mmHg   pO2, Ven 38 32 - 45 mmHg   Bicarbonate 29.2 (H) 20.0 - 28.0 mmol/L   Acid-Base Excess 4.5 (H) 0.0 - 2.0 mmol/L   O2 Saturation 72.3 %   Patient temperature 37.0     Comment: Performed at [facility], [address] [place], [place] [address]   US Abdomen Limited RUQ (LIVER/GB)  Result Date: 08/02/2022 CLINICAL DATA:  Transaminitis. EXAM: ULTRASOUND ABDOMEN LIMITED RIGHT UPPER QUADRANT COMPARISON:  CT [facility] from earlier today FINDINGS: Gallbladder: No gallstones or wall thickening visualized. No sonographic Murphy sign noted by sonographer. Common bile duct: Diameter: 6 mm. Liver: No focal lesion identified. Within normal limits in parenchymal echogenicity. Portal vein is patent on color Doppler imaging with normal direction of blood flow towards the liver. Other: None. IMPRESSION: Normal right  upper quadrant sonogram. Electronically Signed   By: Signa Kell M.D.   On: 08/02/2022 12:32   CT ABDOMEN PELVIS W CONTRAST  Result Date: 08/02/2022 CLINICAL DATA:  Abdominal pain and possible UTI for 3 days. EXAM: CT ABDOMEN AND PELVIS WITH CONTRAST TECHNIQUE: Multidetector CT imaging of the abdomen and pelvis was performed using the standard protocol following bolus administration of intravenous contrast. RADIATION DOSE REDUCTION: This exam was performed according to the departmental dose-optimization program which includes automated exposure control, adjustment of the mA and/or kV according to patient size and/or use of iterative reconstruction technique. CONTRAST:  OMNIPAQUE IOHEXOL 300 MG/ML  SOLN COMPARISON:  06/16/2021 FINDINGS: Lower chest: No acute abnormality. Hepatobiliary: Hemangioma in the lateral segment of left hepatic lobe is unchanged measuring 1.2 cm, image 17/2. No suspicious liver lesion identified. Gallbladder appears normal. No bile duct dilatation. Pancreas: Unremarkable. No pancreatic ductal dilatation or surrounding inflammatory changes. Spleen: Choose 1 Adrenals/Urinary Tract: Normal adrenal glands. There is mild perinephric soft tissue stranding about the inferior pole of both kidneys. Equivocal striated nephrogram on the delayed images within the left kidney. No mass or hydronephrosis identified. No nephrolithiasis. Urinary bladder appears normal. Stomach/Bowel: Small hiatal hernia. Stomach is nondistended. No pathologic dilatation of the large or small bowel loops. Moderate retained stool identified within the colon. No bowel wall thickening or inflammation identified. The appendix is  visualized and appears normal. Vascular/Lymphatic: Aortic atherosclerosis. No aneurysm. No signs of retroperitoneal, mesenteric or pelvic adenopathy. Reproductive: Uterus and bilateral adnexa are unremarkable. Other: Small fat containing umbilical hernia. No abdominopelvic ascites.  Musculoskeletal: Thoracolumbar scoliosis deformity identified. Status post posterior hardware fixation of L1 through the sacrum. Solid fusion of the L1 through L5 vertebra identified. Marked degenerative disc disease is noted at T12-L1. IMPRESSION: 1. Mild perinephric soft tissue stranding about the inferior pole of both kidneys. Equivocal striated nephrogram on the delayed images within the left kidney. Correlate for any clinical signs or symptoms of pyelonephritis. 2. Moderate retained stool identified within the colon. Correlate for any clinical signs or symptoms of constipation. 3. Small hiatal hernia. 4. Small fat containing umbilical hernia. 5. Thoracolumbar scoliosis deformity status post posterior hardware fixation of L1 through the sacrum. Solid fusion of the L1 through L5 vertebra identified. Marked degenerative disc disease is noted at T12-L1. 6.  Aortic Atherosclerosis (ICD10-I70.0). Electronically Signed   By: Signa Kell M.D.   On: 08/02/2022 11:44   DG Chest 2 View  Result Date: 08/02/2022 CLINICAL DATA:  Suspected sepsis.  Dehydration and possible UTI. EXAM: CHEST - 2 VIEW COMPARISON:  None Available. FINDINGS: Normal heart size and mediastinal contours. No pleural fluid or airspace disease. No signs of interstitial edema. Thoracolumbar scoliosis with spinal fixation hardware noted in the lumbar spine. IMPRESSION: No active cardiopulmonary abnormalities. Electronically Signed   By: Signa Kell M.D.   On: 08/02/2022 09:38    Pending Labs Unresulted Labs (From admission, onward)    None       Vitals/Pain Today's Vitals   08/02/22 0822 08/02/22 0824 08/02/22 0856 08/02/22 1030  BP:    (!) 129/55  Pulse:    85  Resp: 18   20  Temp:    99.2 F (37.3 C)  TempSrc:    Oral  SpO2:    98%  Weight:   70.3 kg   Height:   5\' 2"  (1.575 m)   PainSc:  0-No pain      Isolation Precautions No active isolations  Medications Medications  ciprofloxacin (CIPRO) IVPB 400 mg (400 mg  Intravenous New Bag/Given 08/02/22 1235)  lactated ringers bolus 2,000 mL (2,000 mLs Intravenous New Bag/Given 08/02/22 0906)  acetaminophen (TYLENOL) tablet 650 mg (650 mg Oral Given 08/02/22 0958)  iohexol (OMNIPAQUE) 300 MG/ML solution 100 mL (100 mLs Intravenous Contrast Given 08/02/22 1123)    Mobility walks

## 2022-08-02 NOTE — ED Notes (Signed)
Md updated on patient condition

## 2022-08-03 DIAGNOSIS — N10 Acute tubulo-interstitial nephritis: Secondary | ICD-10-CM | POA: Diagnosis not present

## 2022-08-03 LAB — URINE CULTURE

## 2022-08-03 LAB — COMPREHENSIVE METABOLIC PANEL
ALT: 58 U/L — ABNORMAL HIGH (ref 0–44)
AST: 70 U/L — ABNORMAL HIGH (ref 15–41)
Albumin: 2.6 g/dL — ABNORMAL LOW (ref 3.5–5.0)
Alkaline Phosphatase: 90 U/L (ref 38–126)
Anion gap: 8 (ref 5–15)
BUN: 12 mg/dL (ref 8–23)
CO2: 23 mmol/L (ref 22–32)
Calcium: 8.4 mg/dL — ABNORMAL LOW (ref 8.9–10.3)
Chloride: 99 mmol/L (ref 98–111)
Creatinine, Ser: 0.91 mg/dL (ref 0.44–1.00)
GFR, Estimated: >60 mL/min (ref >60)
Glucose, Bld: 122 mg/dL — ABNORMAL HIGH (ref 70–99)
Potassium: 4.1 mmol/L (ref 3.5–5.1)
Sodium: 130 mmol/L — ABNORMAL LOW (ref 135–145)
Total Bilirubin: 1.5 mg/dL — ABNORMAL HIGH (ref 0.3–1.2)
Total Protein: 5.1 g/dL — ABNORMAL LOW (ref 6.5–8.1)

## 2022-08-03 LAB — CBC
HCT: 27.4 % — ABNORMAL LOW (ref 36.0–46.0)
Hemoglobin: 8.9 g/dL — ABNORMAL LOW (ref 12.0–15.0)
MCH: 32.1 pg (ref 26.0–34.0)
MCHC: 32.5 g/dL (ref 30.0–36.0)
MCV: 98.9 fL (ref 80.0–100.0)
Platelets: 144 10*3/uL — ABNORMAL LOW (ref 150–400)
RBC: 2.77 MIL/uL — ABNORMAL LOW (ref 3.87–5.11)
RDW: 11.8 % (ref 11.5–15.5)
WBC: 9.2 10*3/uL (ref 4.0–10.5)
nRBC: 0 % (ref 0.0–0.2)

## 2022-08-03 LAB — HEPATITIS PANEL, ACUTE
Hep A IgM: NONREACTIVE
Hep B C IgM: NONREACTIVE
Hepatitis B Surface Ag: NONREACTIVE

## 2022-08-03 LAB — GLUCOSE, CAPILLARY
Glucose-Capillary: 123 mg/dL — ABNORMAL HIGH (ref 70–99)
Glucose-Capillary: 156 mg/dL — ABNORMAL HIGH (ref 70–99)
Glucose-Capillary: 96 mg/dL (ref 70–99)
Glucose-Capillary: 88 mg/dL (ref 70–99)

## 2022-08-03 LAB — HIV ANTIBODY (ROUTINE TESTING W REFLEX): HIV Screen 4th Generation wRfx: NONREACTIVE

## 2022-08-03 MED ORDER — ALPRAZOLAM 0.5 MG PO TABS
0.5000 mg | ORAL_TABLET | Freq: Four times a day (QID) | ORAL | Status: DC | PRN
  Administered 2022-08-03 – 2022-08-04 (×2): 0.5 mg via ORAL
  Filled 2022-08-03 (×2): qty 1

## 2022-08-03 MED ORDER — ALPRAZOLAM 0.25 MG PO TABS: 0.2500 mg | ORAL_TABLET | Freq: Once | ORAL | Status: DC | PRN

## 2022-08-03 MED ORDER — INSULIN PUMP
Freq: Three times a day (TID) | SUBCUTANEOUS | Status: DC
  Administered 2022-08-04: 3.47 via SUBCUTANEOUS
  Administered 2022-08-04: 2.71 via SUBCUTANEOUS
  Administered 2022-08-05: 4.44 via SUBCUTANEOUS
  Filled 2022-08-03: qty 1

## 2022-08-03 MED ORDER — MELATONIN 5 MG PO TABS
5.0000 mg | ORAL_TABLET | Freq: Once | ORAL | Status: AC
  Administered 2022-08-03: 5 mg via ORAL
  Filled 2022-08-03: qty 1

## 2022-08-03 NOTE — Inpatient Diabetes Management (Signed)
Inpatient Diabetes Program Recommendations  AACE/ADA: New Consensus Statement on Inpatient Glycemic Control (2015)  Target Ranges:  Prepandial:   less than 140 mg/dL      Peak postprandial:   less than 180 mg/dL (1-2 hours)      Critically ill patients:  140 - 180 mg/dL    Latest Reference Range & Units 03/20/22 10:30  Hemoglobin A1C 4.8 - 5.6 % 5.9 (H)  (H): Data is abnormally high  Latest Reference Range & Units 08/02/22 08:05 08/02/22 16:56 08/02/22 17:21 08/03/22 08:01  Glucose-Capillary 70 - 99 mg/dL 604 (H) 62 (L) 84 540 (H)  (H): Data is abnormally high (L): Data is abnormally low    Admit with: UTI/pyelonephritis  History: Type 1 Diabetes  Home DM Meds: Insulin Pump (Tandem T slim)        Mounjaro 5 mg Qweek  Current Orders: Insulin Pump    Patient sees Dr. Felipa Eth for diabetes management and Dr. [name] for education and insulin pump adjustments   CBGs well controlled so far in hospital--Insulin Pump orders in place   --Will follow patient during hospitalization--  Ambrose Finland RN, MSN, CDCES Diabetes Coordinator Inpatient Glycemic Control Team Team Pager: [phone] (8a-5p)

## 2022-08-03 NOTE — Progress Notes (Signed)
Patient admitted with glucose monitor and insulin pump. Per patient, gave herself 12 units of insulin overnight.

## 2022-08-03 NOTE — Progress Notes (Signed)
PROGRESS NOTE    Colleen Zuniga  WUJ:[id number] DOB: January 07, 1961 DOA: 08/02/2022 PCP: Chilton Greathouse, MD    Brief Narrative:   Colleen Zuniga is a 62 y.o. female with past medical history significant for type 1 diabetes mellitus on insulin pump, anxiety/depression, HTN, hypothyroidism, GERD who presented to [facility] ED on 5/25 for evaluation of fever, dysuria, back pain, nausea and vomiting.  Patient was seen by her PCP 3 weeks prior, diagnosed with UTI and completed a course of oral Macrobid.  Initially she felt better about 3 days ago she forgot to place her insulin pump and her monitor was reading high for the majority of the day.  Following improvement of her glucose after replacing insulin pump, she developed nausea, vomiting and cloudy urine.  Her PCP once again started her on oral Macrobid after obtaining a urine culture but she presents to the ED for further evaluation given fever, nausea, vomiting and unable to tolerate oral intake.  Denies abdominal pain.  In the ED, temperature 100.6 F, HR 83, RR 18, BP 130/57, SpO2 100% on room air.  WBC 11.0, hemoglobin 11.5, platelets 171.  Sodium 131, potassium 3.6, chloride 98, CO2 22, glucose 105, BUN 13, creatinine 0.82.  AST 78, ALT 63, total bilirubin 1.9.  Urinalysis with moderate leukocytes, negative nitrite, rare bacteria, greater than 50 WBCs.  Ultrasound abdomen with no acute findings.  CT abdomen/pelvis with contrast with mild perinephric soft tissue stranding about the inferior pole of both kidneys consistent with pyelonephritis, moderate retained stool within the colon.  Patient was started on IV ciprofloxacin given severe allergy to penicillin.  TRH consulted for admission.  Assessment & Plan:   Acute pyelonephritis Presenting with fever, nausea/vomiting and inability to tolerate oral intake.  Urinalysis consistent with infection.  Patient with fever 100.6, WBC count elevated 11.0 and CT abdomen/pelvis with findings  consistent with acute pyelonephritis.  Urine culture with multiple species present. -- WBC 11.0>9.2 -- Continue ciprofloxacin 400 mg IV q12h -- NS at 100 mL/h -- Antiemetics, antipyretics -- CBC daily  Transaminitis Etiology likely secondary to continued Tylenol use outpatient versus active infection. -- AST 78>70 -- ALT 65>58 -- Tbili 1.9>1.5 -- Avoid hepatotoxins -- Repeat CMP in a.m.  Hyponatremia Sodium 131 on admission, likely secondary to hypovolemic hyponatremia in the setting of nausea/vomiting secondary to pyelonephritis. -- Continue IV fluid hydration as above  Type 1 diabetes mellitus -- Diabetic educator following -- Continue insulin pump  Essential hypertension -- Irbesartan 300 mg p.o. daily  Hypothyroidism -- Levothyroxine 88 mcg p.o. daily  Anxiety/depression -- Wellbutrin 300 mg p.o. daily -- Lexapro 10 mg p.o. daily -- Xanax 0.5 mg every 6 hours as needed anxiety   DVT prophylaxis: enoxaparin (LOVENOX) injection 40 mg Start: 08/02/22 2200 SCDs Start: 08/02/22 1335    Code Status: Full Code Family Communication: No family present at bedside this morning  Disposition Plan:  Level of care: Med-Surg Status is: Inpatient Remains inpatient appropriate because: IV antibiotics, needs to ensure able to tolerate oral intake and be afebrile for 24-48 hours before stable for discharge home.    Consultants:  None  Procedures:  None  Antimicrobials:  Ciprofloxacin 5/25>>   Subjective: Patient seen examined bedside, resting calmly.  Lying in bed.  Nausea improving.  Tmax past 24 hours 100.6.  Remains on IV antibiotics for acute pyelonephritis.  No other specific questions or concerns at this time.  Denies headache, no chest pain, no palpitations, no shortness of breath, no  current fever, no chills/night sweats, no current nausea/vomiting, no diarrhea, no cough/congestion, no abdominal pain, no focal weakness, no fatigue, no paresthesias.  No acute events  overnight per nursing staff.  Objective: Vitals:   08/02/22 1629 08/02/22 2024 08/03/22 0027 08/03/22 0517  BP: (!) 130/47 (!) 127/51 (!) 102/45 (!) 134/58  Pulse: 91 85 80 83  Resp: 16 18 18 20   Temp: 98.9 F (37.2 C) (!) 100.6 F (38.1 C) 98.7 F (37.1 C) 98.5 F (36.9 C)  TempSrc: Oral Oral Oral Oral  SpO2: 97% 96% 96% 95%  Weight:      Height:        Intake/Output Summary (Last 24 hours) at 08/03/2022 1154 Last data filed at 08/02/2022 2200 Gross per 24 hour  Intake 2629.64 ml  Output 1 ml  Net 2628.64 ml   Filed Weights   08/02/22 0856  Weight: 70.3 kg    Examination:  Physical Exam: GEN: NAD, alert and oriented x 3, wd/wn HEENT: NCAT, PERRL, EOMI, sclera clear, MMM PULM: CTAB w/o wheezes/crackles, normal respiratory effort, on room air, on room air CV: RRR w/o M/G/R GI: abd soft, NTND, NABS, no R/G/M MSK: no peripheral edema, muscle strength globally intact 5/5 bilateral upper/lower extremities NEURO: CN II-XII intact, no focal deficits, sensation to light touch intact PSYCH: normal mood/affect Integumentary: dry/intact, no rashes or wounds    Data Reviewed: I have personally reviewed following labs and imaging studies  CBC: Recent Labs  Lab 08/02/22 0819 08/03/22 0344  WBC 11.0* 9.2  NEUTROABS 9.4*  --   HGB 11.5* 8.9*  HCT 34.0* 27.4*  MCV 97.1 98.9  PLT 171 144*   Basic Metabolic Panel: Recent Labs  Lab 08/02/22 0819 08/03/22 0344  NA 131* 130*  K 3.6 4.1  CL 98 99  CO2 22 23  GLUCOSE 105* 122*  BUN 13 12  CREATININE 0.82 0.91  CALCIUM 9.1 8.4*   GFR: Estimated Creatinine Clearance: 58.1 mL/min (by C-G formula based on SCr of 0.91 mg/dL). Liver Function Tests: Recent Labs  Lab 08/02/22 0819 08/03/22 0344  AST 78* 70*  ALT 63* 58*  ALKPHOS 115 90  BILITOT 1.9* 1.5*  PROT 6.4* 5.1*  ALBUMIN 3.6 2.6*   Recent Labs  Lab 08/02/22 0819  LIPASE 31   No results for input(s): "AMMONIA" in the last 168 hours. Coagulation  Profile: No results for input(s): "INR", "PROTIME" in the last 168 hours. Cardiac Enzymes: No results for input(s): "CKTOTAL", "CKMB", "CKMBINDEX", "TROPONINI" in the last 168 hours. BNP (last 3 results) No results for input(s): "PROBNP" in the last 8760 hours. HbA1C: No results for input(s): "HGBA1C" in the last 72 hours. CBG: Recent Labs  Lab 08/02/22 0805 08/02/22 1656 08/02/22 1721 08/03/22 0801 08/03/22 1128  GLUCAP 106* 62* 84 123* 156*   Lipid Profile: No results for input(s): "CHOL", "HDL", "LDLCALC", "TRIG", "CHOLHDL", "LDLDIRECT" in the last 72 hours. Thyroid Function Tests: No results for input(s): "TSH", "T4TOTAL", "FREET4", "T3FREE", "THYROIDAB" in the last 72 hours. Anemia Panel: No results for input(s): "VITAMINB12", "FOLATE", "FERRITIN", "TIBC", "IRON", "RETICCTPCT" in the last 72 hours. Sepsis Labs: Recent Labs  Lab 08/02/22 1330 08/02/22 1640  LATICACIDVEN 1.2 1.5    Recent Results (from the past 240 hour(s))  Urine Culture     Status: Abnormal   Collection Time: 08/02/22  8:30 AM   Specimen: Urine, Random  Result Value Ref Range Status   Specimen Description   Final    URINE, RANDOM Performed at [facility]  [facility], [address] [place], [place] [address]    Special Requests   Final    URINE, CLEAN CATCH Performed at [facility], [address] [place], [place] [address]    Culture MULTIPLE SPECIES PRESENT, SUGGEST RECOLLECTION (A)  Final   Report Status 08/03/2022 FINAL  Final         Radiology Studies: US Abdomen Limited RUQ (LIVER/GB)  Result Date: 08/02/2022 CLINICAL DATA:  Transaminitis. EXAM: ULTRASOUND ABDOMEN LIMITED RIGHT UPPER QUADRANT COMPARISON:  CT AP from earlier today FINDINGS: Gallbladder: No gallstones or wall thickening visualized. No sonographic Murphy sign noted by sonographer. Common bile duct: Diameter: 6 mm. Liver: No focal lesion identified. Within normal limits in parenchymal echogenicity.  Portal vein is patent on color Doppler imaging with normal direction of blood flow towards the liver. Other: None. IMPRESSION: Normal right upper quadrant sonogram. Electronically Signed   By: Signa Kell M.D.   On: 08/02/2022 12:32   CT ABDOMEN PELVIS W CONTRAST  Result Date: 08/02/2022 CLINICAL DATA:  Abdominal pain and possible UTI for 3 days. EXAM: CT ABDOMEN AND PELVIS WITH CONTRAST TECHNIQUE: Multidetector CT imaging of the abdomen and pelvis was performed using the standard protocol following bolus administration of intravenous contrast. RADIATION DOSE REDUCTION: This exam was performed according to the departmental dose-optimization program which includes automated exposure control, adjustment of the mA and/or kV according to patient size and/or use of iterative reconstruction technique. CONTRAST:  OMNIPAQUE IOHEXOL 300 MG/ML  SOLN COMPARISON:  06/16/2021 FINDINGS: Lower chest: No acute abnormality. Hepatobiliary: Hemangioma in the lateral segment of left hepatic lobe is unchanged measuring 1.2 cm, image 17/2. No suspicious liver lesion identified. Gallbladder appears normal. No bile duct dilatation. Pancreas: Unremarkable. No pancreatic ductal dilatation or surrounding inflammatory changes. Spleen: Choose 1 Adrenals/Urinary Tract: Normal adrenal glands. There is mild perinephric soft tissue stranding about the inferior pole of both kidneys. Equivocal striated nephrogram on the delayed images within the left kidney. No mass or hydronephrosis identified. No nephrolithiasis. Urinary bladder appears normal. Stomach/Bowel: Small hiatal hernia. Stomach is nondistended. No pathologic dilatation of the large or small bowel loops. Moderate retained stool identified within the colon. No bowel wall thickening or inflammation identified. The appendix is visualized and appears normal. Vascular/Lymphatic: Aortic atherosclerosis. No aneurysm. No signs of retroperitoneal, mesenteric or pelvic adenopathy.  Reproductive: Uterus and bilateral adnexa are unremarkable. Other: Small fat containing umbilical hernia. No abdominopelvic ascites. Musculoskeletal: Thoracolumbar scoliosis deformity identified. Status post posterior hardware fixation of L1 through the sacrum. Solid fusion of the L1 through L5 vertebra identified. Marked degenerative disc disease is noted at T12-L1. IMPRESSION: 1. Mild perinephric soft tissue stranding about the inferior pole of both kidneys. Equivocal striated nephrogram on the delayed images within the left kidney. Correlate for any clinical signs or symptoms of pyelonephritis. 2. Moderate retained stool identified within the colon. Correlate for any clinical signs or symptoms of constipation. 3. Small hiatal hernia. 4. Small fat containing umbilical hernia. 5. Thoracolumbar scoliosis deformity status post posterior hardware fixation of L1 through the sacrum. Solid fusion of the L1 through L5 vertebra identified. Marked degenerative disc disease is noted at T12-L1. 6.  Aortic Atherosclerosis (ICD10-I70.0). Electronically Signed   By: Signa Kell M.D.   On: 08/02/2022 11:44   DG Chest 2 View  Result Date: 08/02/2022 CLINICAL DATA:  Suspected sepsis.  Dehydration and possible UTI. EXAM: CHEST - 2 VIEW COMPARISON:  None Available. FINDINGS: Normal heart size and mediastinal contours. No pleural fluid  or airspace disease. No signs of interstitial edema. Thoracolumbar scoliosis with spinal fixation hardware noted in the lumbar spine. IMPRESSION: No active cardiopulmonary abnormalities. Electronically Signed   By: Signa Kell M.D.   On: 08/02/2022 09:38        Scheduled Meds:  buPROPion  300 mg Oral Daily   docusate sodium  100 mg Oral QPM   enoxaparin (LOVENOX) injection  40 mg Subcutaneous Q24H   escitalopram  10 mg Oral QHS   ferrous sulfate  325 mg Oral QHS   gabapentin  300 mg Oral TID   insulin pump   Subcutaneous TID WC, HS, 0200   irbesartan  300 mg Oral Daily    levothyroxine  88 mcg Oral QAC breakfast   Continuous Infusions:  sodium chloride 100 mL/hr at 08/03/22 1003   ciprofloxacin 400 mg (08/03/22 1000)     LOS: 1 day    Time spent: 52 minutes spent on chart review, discussion with nursing staff, consultants, updating family and interview/physical exam; more than 50% of that time was spent in counseling and/or coordination of care.    Alvira Philips [place], DO [facility] Available via Epic secure chat 7am-7pm After these hours, please refer to coverage provider listed on [email or web address] 08/03/2022, 11:54 AM

## 2022-08-04 ENCOUNTER — Encounter (INDEPENDENT_AMBULATORY_CARE_PROVIDER_SITE_OTHER): Payer: Self-pay

## 2022-08-04 DIAGNOSIS — N10 Acute tubulo-interstitial nephritis: Secondary | ICD-10-CM | POA: Diagnosis not present

## 2022-08-04 LAB — COMPREHENSIVE METABOLIC PANEL
ALT: 65 U/L — ABNORMAL HIGH (ref 0–44)
AST: 70 U/L — ABNORMAL HIGH (ref 15–41)
Albumin: 2.7 g/dL — ABNORMAL LOW (ref 3.5–5.0)
Alkaline Phosphatase: 151 U/L — ABNORMAL HIGH (ref 38–126)
Anion gap: 7 (ref 5–15)
BUN: 11 mg/dL (ref 8–23)
CO2: 20 mmol/L — ABNORMAL LOW (ref 22–32)
Calcium: 8.3 mg/dL — ABNORMAL LOW (ref 8.9–10.3)
Chloride: 102 mmol/L (ref 98–111)
Creatinine, Ser: 0.94 mg/dL (ref 0.44–1.00)
GFR, Estimated: >60 mL/min (ref >60)
Glucose, Bld: 98 mg/dL (ref 70–99)
Potassium: 4.2 mmol/L (ref 3.5–5.1)
Sodium: 129 mmol/L — ABNORMAL LOW (ref 135–145)
Total Bilirubin: 1 mg/dL (ref 0.3–1.2)
Total Protein: 5.4 g/dL — ABNORMAL LOW (ref 6.5–8.1)

## 2022-08-04 LAB — CBC
HCT: 27.9 % — ABNORMAL LOW (ref 36.0–46.0)
Hemoglobin: 9 g/dL — ABNORMAL LOW (ref 12.0–15.0)
MCH: 32 pg (ref 26.0–34.0)
MCHC: 32.3 g/dL (ref 30.0–36.0)
MCV: 99.3 fL (ref 80.0–100.0)
Platelets: 141 10*3/uL — ABNORMAL LOW (ref 150–400)
RBC: 2.81 MIL/uL — ABNORMAL LOW (ref 3.87–5.11)
RDW: 11.8 % (ref 11.5–15.5)
WBC: 5.9 10*3/uL (ref 4.0–10.5)
nRBC: 0 % (ref 0.0–0.2)

## 2022-08-04 LAB — MAGNESIUM: Magnesium: 1.6 mg/dL — ABNORMAL LOW (ref 1.7–2.4)

## 2022-08-04 LAB — GLUCOSE, CAPILLARY
Glucose-Capillary: 76 mg/dL (ref 70–99)
Glucose-Capillary: 88 mg/dL (ref 70–99)
Glucose-Capillary: 170 mg/dL — ABNORMAL HIGH (ref 70–99)

## 2022-08-04 MED ORDER — CALCIUM CARBONATE ANTACID 500 MG PO CHEW
2.0000 | CHEWABLE_TABLET | Freq: Once | ORAL | Status: AC
  Administered 2022-08-04: 400 mg via ORAL
  Filled 2022-08-04: qty 2

## 2022-08-04 MED ORDER — LEVOTHYROXINE SODIUM 100 MCG PO TABS
100.0000 ug | ORAL_TABLET | Freq: Every day | ORAL | Status: DC
  Administered 2022-08-05: 100 ug via ORAL
  Filled 2022-08-04: qty 1

## 2022-08-04 NOTE — Progress Notes (Signed)
  Transition of Care [facility]) Screening Note   Patient Details  Name: Colleen Zuniga Date of Birth: 1961-01-08   Transition of Care [facility]) CM/SW Contact:    Otelia Santee, LCSW Phone Number: 08/04/2022, 9:05 AM    Transition of Care Department [facility]) has reviewed patient and no TOC needs have been identified at this time. We will continue to monitor patient advancement through interdisciplinary progression rounds. If new patient transition needs arise, please place a TOC consult.

## 2022-08-04 NOTE — Progress Notes (Signed)
PROGRESS NOTE    Colleen Zuniga  WJX:[id number] DOB: 03-07-1961 DOA: 08/02/2022 PCP: Chilton Greathouse, MD    Brief Narrative:   Colleen Zuniga is a 62 y.o. female with past medical history significant for type 1 diabetes mellitus on insulin pump, anxiety/depression, HTN, hypothyroidism, GERD who presented to [facility] ED on 5/25 for evaluation of fever, dysuria, back pain, nausea and vomiting.  Patient was seen by her PCP 3 weeks prior, diagnosed with UTI and completed a course of oral Macrobid.  Initially she felt better about 3 days ago she forgot to place her insulin pump and her monitor was reading high for the majority of the day.  Following improvement of her glucose after replacing insulin pump, she developed nausea, vomiting and cloudy urine.  Her PCP once again started her on oral Macrobid after obtaining a urine culture but she presents to the ED for further evaluation given fever, nausea, vomiting and unable to tolerate oral intake.  Denies abdominal pain.  In the ED, temperature 100.6 F, HR 83, RR 18, BP 130/57, SpO2 100% on room air.  WBC 11.0, hemoglobin 11.5, platelets 171.  Sodium 131, potassium 3.6, chloride 98, CO2 22, glucose 105, BUN 13, creatinine 0.82.  AST 78, ALT 63, total bilirubin 1.9.  Urinalysis with moderate leukocytes, negative nitrite, rare bacteria, greater than 50 WBCs.  Ultrasound abdomen with no acute findings.  CT abdomen/pelvis with contrast with mild perinephric soft tissue stranding about the inferior pole of both kidneys consistent with pyelonephritis, moderate retained stool within the colon.  Patient was started on IV ciprofloxacin given severe allergy to penicillin.  TRH consulted for admission.  Assessment & Plan:   Acute pyelonephritis Presenting with fever, nausea/vomiting and inability to tolerate oral intake.  Urinalysis consistent with infection.  Patient with fever 100.6, WBC count elevated 11.0 and CT abdomen/pelvis with findings  consistent with acute pyelonephritis.  Urine culture with multiple species present. -- WBC 11.0>9.2>5.9 -- Continue ciprofloxacin 400 mg IV q12h -- NS at 100 mL/h -- Antiemetics, antipyretics -- CBC daily  Transaminitis Etiology likely secondary to continued Tylenol use outpatient versus active infection. -- AST 78>70>70 -- ALT 65>58>65 -- Tbili 1.9>1.5>1.0 -- Avoid hepatotoxins -- Repeat CMP in a.m.  Hyponatremia Sodium 131 on admission, likely secondary to hypovolemic hyponatremia in the setting of nausea/vomiting secondary to pyelonephritis. -- Continue IV fluid hydration as above  Type 1 diabetes mellitus -- Diabetic educator following -- Continue insulin pump  Essential hypertension -- Irbesartan 300 mg p.o. daily  Hypothyroidism -- Levothyroxine 88 mcg p.o. daily  Anxiety/depression -- Wellbutrin 300 mg p.o. daily -- Lexapro 10 mg p.o. daily -- Xanax 0.5 mg every 6 hours as needed anxiety   DVT prophylaxis: enoxaparin (LOVENOX) injection 40 mg Start: 08/02/22 2200 SCDs Start: 08/02/22 1335    Code Status: Full Code Family Communication: No family present at bedside this morning  Disposition Plan:  Level of care: Med-Surg Status is: Inpatient Remains inpatient appropriate because: IV antibiotics, anticipate likely discharge home tomorrow    Consultants:  None  Procedures:  None  Antimicrobials:  Ciprofloxacin 5/25>>   Subjective: Patient seen examined bedside, resting calmly.  Lying in bed.  Nausea resolved.  Afebrile past 24 hours.  Remains on IV antibiotics.  Continues with some mild back pain, slowly improving.   No other specific questions or concerns at this time.  Denies headache, no chest pain, no palpitations, no shortness of breath, no current fever, no chills/night sweats, no current nausea/vomiting,  no diarrhea, no cough/congestion, no abdominal pain, no focal weakness, no fatigue, no paresthesias.  No acute events overnight per nursing  staff.  Objective: Vitals:   08/03/22 0517 08/03/22 1319 08/03/22 2022 08/04/22 0453  BP: (!) 134/58 (!) 126/53 130/71 (!) 122/55  Pulse: 83 79 89 87  Resp: 20 16 18 18   Temp: 98.5 F (36.9 C) 98.6 F (37 C) 99.5 F (37.5 C) 98.2 F (36.8 C)  TempSrc: Oral  Oral Oral  SpO2: 95% 96% 100% 98%  Weight:      Height:        Intake/Output Summary (Last 24 hours) at 08/04/2022 1321 Last data filed at 08/03/2022 2115 Gross per 24 hour  Intake 1424.71 ml  Output --  Net 1424.71 ml   Filed Weights   08/02/22 0856  Weight: 70.3 kg    Examination:  Physical Exam: GEN: NAD, alert and oriented x 3, wd/wn HEENT: NCAT, PERRL, EOMI, sclera clear, MMM PULM: CTAB w/o wheezes/crackles, normal respiratory effort, on room air, on room air CV: RRR w/o M/G/R GI: abd soft, NTND, NABS, no R/G/M MSK: no peripheral edema, muscle strength globally intact 5/5 bilateral upper/lower extremities NEURO: CN II-XII intact, no focal deficits, sensation to light touch intact PSYCH: normal mood/affect Integumentary: dry/intact, no rashes or wounds    Data Reviewed: I have personally reviewed following labs and imaging studies  CBC: Recent Labs  Lab 08/02/22 0819 08/03/22 0344 08/04/22 0338  WBC 11.0* 9.2 5.9  NEUTROABS 9.4*  --   --   HGB 11.5* 8.9* 9.0*  HCT 34.0* 27.4* 27.9*  MCV 97.1 98.9 99.3  PLT 171 144* 141*   Basic Metabolic Panel: Recent Labs  Lab 08/02/22 0819 08/03/22 0344 08/04/22 0338  NA 131* 130* 129*  K 3.6 4.1 4.2  CL 98 99 102  CO2 22 23 20*  GLUCOSE 105* 122* 98  BUN 13 12 11   CREATININE 0.82 0.91 0.94  CALCIUM 9.1 8.4* 8.3*  MG  --   --  1.6*   GFR: Estimated Creatinine Clearance: 56.3 mL/min (by C-G formula based on SCr of 0.94 mg/dL). Liver Function Tests: Recent Labs  Lab 08/02/22 0819 08/03/22 0344 08/04/22 0338  AST 78* 70* 70*  ALT 63* 58* 65*  ALKPHOS 115 90 151*  BILITOT 1.9* 1.5* 1.0  PROT 6.4* 5.1* 5.4*  ALBUMIN 3.6 2.6* 2.7*   Recent  Labs  Lab 08/02/22 0819  LIPASE 31   No results for input(s): "AMMONIA" in the last 168 hours. Coagulation Profile: No results for input(s): "INR", "PROTIME" in the last 168 hours. Cardiac Enzymes: No results for input(s): "CKTOTAL", "CKMB", "CKMBINDEX", "TROPONINI" in the last 168 hours. BNP (last 3 results) No results for input(s): "PROBNP" in the last 8760 hours. HbA1C: No results for input(s): "HGBA1C" in the last 72 hours. CBG: Recent Labs  Lab 08/03/22 0801 08/03/22 1128 08/03/22 1704 08/03/22 2121 08/04/22 0135  GLUCAP 123* 156* 96 88 76   Lipid Profile: No results for input(s): "CHOL", "HDL", "LDLCALC", "TRIG", "CHOLHDL", "LDLDIRECT" in the last 72 hours. Thyroid Function Tests: No results for input(s): "TSH", "T4TOTAL", "FREET4", "T3FREE", "THYROIDAB" in the last 72 hours. Anemia Panel: No results for input(s): "VITAMINB12", "FOLATE", "FERRITIN", "TIBC", "IRON", "RETICCTPCT" in the last 72 hours. Sepsis Labs: Recent Labs  Lab 08/02/22 1330 08/02/22 1640  LATICACIDVEN 1.2 1.5    Recent Results (from the past 240 hour(s))  Urine Culture     Status: Abnormal   Collection Time: 08/02/22  8:30 AM  Specimen: Urine, Random  Result Value Ref Range Status   Specimen Description   Final    URINE, RANDOM Performed at [facility], [address], [place], [place] [address]    Special Requests   Final    URINE, CLEAN CATCH Performed at [facility], [address] [place], [place] [address]    Culture MULTIPLE SPECIES PRESENT, SUGGEST RECOLLECTION (A)  Final   Report Status 08/03/2022 FINAL  Final         Radiology Studies: No results found.      Scheduled Meds:  buPROPion  300 mg Oral Daily   docusate sodium  100 mg Oral QPM   enoxaparin (LOVENOX) injection  40 mg Subcutaneous Q24H   escitalopram  10 mg Oral QHS   ferrous sulfate  325 mg Oral QHS   gabapentin  300 mg Oral TID   insulin pump   Subcutaneous TID WC, HS,  0200   irbesartan  300 mg Oral Daily   [START ON 08/05/2022] levothyroxine  100 mcg Oral QAC breakfast   Continuous Infusions:  sodium chloride 100 mL/hr at 08/03/22 2115   ciprofloxacin 400 mg (08/04/22 1052)     LOS: 2 days    Time spent: 52 minutes spent on chart review, discussion with nursing staff, consultants, updating family and interview/physical exam; more than 50% of that time was spent in counseling and/or coordination of care.    Alvira Philips [place], DO [facility] Available via Epic secure chat 7am-7pm After these hours, please refer to coverage provider listed on [email or web address] 08/04/2022, 1:21 PM

## 2022-08-04 NOTE — Progress Notes (Signed)
Chaplain engaged in an initial visit with Colleen Zuniga. Colleen Zuniga shared about her healthcare journey and what it has been like having an infection of this magnitude. Colleen Zuniga also shared about the ways her family, friends, and neighbors have shown up for her and husband during this time. She has a lot of community support. Colleen Zuniga shared that her husband has dementia and her community has been wonderful in helping her take care of him while she is hospitalized.   Colleen Zuniga also shared about some impactful things happening in her life. Chaplain offered reflective listening, a compassionate presence, and support. 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     08/04/22 1200  Spiritual Encounters  Type of Visit Initial  Care provided to: Patient  Reason for visit Routine spiritual support  Spiritual Framework  Presenting Themes Impactful experiences and emotions;Community and relationships;Rituals and practive  Community/Connection Family;Faith community;Friend(s);Significant other  Interventions  Spiritual Care Interventions Made Established relationship of care and support;Compassionate presence;Reflective listening;Narrative/life review;Encouragement  Intervention Outcomes  Outcomes Awareness of support;Connected to spiritual community;Connection to spiritual care;Awareness around self/spiritual resourses

## 2022-08-05 ENCOUNTER — Ambulatory Visit (INDEPENDENT_AMBULATORY_CARE_PROVIDER_SITE_OTHER): Payer: Medicare Other | Admitting: Family Medicine

## 2022-08-05 DIAGNOSIS — N10 Acute tubulo-interstitial nephritis: Secondary | ICD-10-CM | POA: Diagnosis not present

## 2022-08-05 LAB — CBC
HCT: 28.4 % — ABNORMAL LOW (ref 36.0–46.0)
Hemoglobin: 9.2 g/dL — ABNORMAL LOW (ref 12.0–15.0)
MCH: 32.2 pg (ref 26.0–34.0)
MCHC: 32.4 g/dL (ref 30.0–36.0)
MCV: 99.3 fL (ref 80.0–100.0)
Platelets: 145 10*3/uL — ABNORMAL LOW (ref 150–400)
RBC: 2.86 MIL/uL — ABNORMAL LOW (ref 3.87–5.11)
RDW: 11.9 % (ref 11.5–15.5)
WBC: 4.4 10*3/uL (ref 4.0–10.5)
nRBC: 0 % (ref 0.0–0.2)

## 2022-08-05 LAB — COMPREHENSIVE METABOLIC PANEL
ALT: 75 U/L — ABNORMAL HIGH (ref 0–44)
AST: 73 U/L — ABNORMAL HIGH (ref 15–41)
Albumin: 2.8 g/dL — ABNORMAL LOW (ref 3.5–5.0)
Alkaline Phosphatase: 197 U/L — ABNORMAL HIGH (ref 38–126)
Anion gap: 7 (ref 5–15)
BUN: 10 mg/dL (ref 8–23)
CO2: 23 mmol/L (ref 22–32)
Calcium: 9 mg/dL (ref 8.9–10.3)
Chloride: 105 mmol/L (ref 98–111)
Creatinine, Ser: 0.88 mg/dL (ref 0.44–1.00)
GFR, Estimated: >60 mL/min (ref >60)
Glucose, Bld: 135 mg/dL — ABNORMAL HIGH (ref 70–99)
Potassium: 4.7 mmol/L (ref 3.5–5.1)
Sodium: 135 mmol/L (ref 135–145)
Total Bilirubin: 0.3 mg/dL (ref 0.3–1.2)
Total Protein: 5.8 g/dL — ABNORMAL LOW (ref 6.5–8.1)

## 2022-08-05 LAB — GLUCOSE, CAPILLARY
Glucose-Capillary: 93 mg/dL (ref 70–99)
Glucose-Capillary: 93 mg/dL (ref 70–99)

## 2022-08-05 MED ORDER — CIPROFLOXACIN HCL 500 MG PO TABS: 500.0000 mg | ORAL_TABLET | Freq: Two times a day (BID) | ORAL | 0 refills | Status: AC

## 2022-08-05 NOTE — Discharge Summary (Signed)
Physician Discharge Summary  Colleen Zuniga ZOX:[id number] DOB: 1960-09-27 DOA: 08/02/2022  PCP: Chilton Greathouse, MD  Admit date: 08/02/2022 Discharge date: 08/05/2022  Admitted From: Home Disposition: Home  Recommendations for Outpatient Follow-up:  Follow up with PCP in 1-2 weeks Continue ciprofloxacin to complete 14-day course for acute pyelonephritis Continue to hold home Tylenol use and to follow-up with PCP, recommend repeat CMP at visit for elevated LFTs  Home Health: No Equipment/Devices: None  Discharge Condition: Stable CODE STATUS: Full code Diet recommendation: Heart healthy diet  History of present illness:  Colleen Zuniga is a 62 y.o. female with past medical history significant for type 1 diabetes mellitus on insulin pump, anxiety/depression, HTN, hypothyroidism, GERD who presented to [facility] ED on 5/25 for evaluation of fever, dysuria, back pain, nausea and vomiting.  Patient was seen by her PCP 3 weeks prior, diagnosed with UTI and completed a course of oral Macrobid.  Initially she felt better about 3 days ago she forgot to place her insulin pump and her monitor was reading high for the majority of the day.  Following improvement of her glucose after replacing insulin pump, she developed nausea, vomiting and cloudy urine.  Her PCP once again started her on oral Macrobid after obtaining a urine culture but she presents to the ED for further evaluation given fever, nausea, vomiting and unable to tolerate oral intake.  Denies abdominal pain.   In the ED, temperature 100.6 F, HR 83, RR 18, BP 130/57, SpO2 100% on room air.  WBC 11.0, hemoglobin 11.5, platelets 171.  Sodium 131, potassium 3.6, chloride 98, CO2 22, glucose 105, BUN 13, creatinine 0.82.  AST 78, ALT 63, total bilirubin 1.9.  Urinalysis with moderate leukocytes, negative nitrite, rare bacteria, greater than 50 WBCs.  Ultrasound abdomen with no acute findings.  CT abdomen/pelvis with contrast  with mild perinephric soft tissue stranding about the inferior pole of both kidneys consistent with pyelonephritis, moderate retained stool within the colon.  Patient was started on IV ciprofloxacin given severe allergy to penicillin.  TRH consulted for admission.  Hospital course:  Acute pyelonephritis Presenting with fever, nausea/vomiting and inability to tolerate oral intake.  Urinalysis consistent with infection.  Patient with fever 100.6, WBC count elevated 11.0 and CT abdomen/pelvis with findings consistent with acute pyelonephritis.  Urine culture with multiple species present.  Patient was started on ciprofloxacin with improvement of white blood cell count to 4.4 at time of discharge.  Patient has been afebrile for 48 hours.  Will continue ciprofloxacin 500 mg p.o. twice daily to complete 14-day course.  Outpatient follow-up with PCP.   Transaminitis Etiology likely secondary to continued Tylenol use outpatient versus active infection as above.  Patient instructed to hold home Tylenol use on discharge.  Recommend outpatient follow-up with PCP with repeat CMP 1 week.   Hyponatremia Sodium 131 on admission, likely secondary to hypovolemic hyponatremia in the setting of nausea/vomiting secondary to pyelonephritis.  Recommend repeat CMP as above 1 week.   Type 1 diabetes mellitus Hemoglobin A1c 5.9 on 03/20/2022, well-controlled.  Continue insulin pump   Essential hypertension Continue irbesartan 300 mg p.o. daily   Hypothyroidism Continue levothyroxine 88 mcg p.o. daily   Anxiety/depression Continue Wellbutrin 300 mg p.o. daily, Lexapro 10 mg p.o. daily, Xanax 0.5 mg every 6 hours as needed anxiety  Discharge Diagnoses:  Principal Problem:   Sepsis (HCC) Active Problems:   Acute pyelonephritis   Type 1 diabetes mellitus without complications (HCC)   GAD (generalized  anxiety disorder)   LFTs abnormal   Pyelonephritis    Discharge Instructions  Discharge Instructions      Diet - low sodium heart healthy   Complete by: As directed    Increase activity slowly   Complete by: As directed       Allergies as of 08/05/2022       Reactions   Penicillins Anaphylaxis, Other (See Comments)   CLOSES THROAT PATIENT HAD A PCN REACTION WITH IMMEDIATE RASH, FACIAL/TONGUE/THROAT SWELLING, SOB, OR LIGHTHEADEDNESS WITH HYPOTENSION:  #  #  #  YES  #  #  #   Has patient had a PCN reaction causing severe rash involving mucus membranes or skin necrosis: UNKNOWN  Has patient had a PCN reaction that required hospitalization:No Has patient had a PCN reaction occurring within the last 10 years:No   Cefdinir Diarrhea, Nausea And Vomiting   Hydrocodone-acetaminophen Nausea And Vomiting   Nausea/vomiting with or without food   Oxycodone Hcl Nausea Only   Adhesive [tape] Itching, Rash        Medication List     STOP taking these medications    acetaminophen 500 MG tablet Commonly known as: TYLENOL   oxyCODONE 5 MG immediate release tablet Commonly known as: Oxy IR/ROXICODONE       TAKE these medications    ADULT GUMMY PO Take 2 tablets by mouth daily.   AIRBORNE PO Take 2 tablets by mouth daily.   albuterol 108 (90 Base) MCG/ACT inhaler Commonly known as: VENTOLIN HFA Inhale 2 puffs into the lungs every 4 (four) hours as needed for shortness of breath or wheezing.   ALPRAZolam 0.5 MG tablet Commonly known as: XANAX Take 0.5 mg by mouth every 6 (six) hours as needed for sleep or anxiety.   atorvastatin 10 MG tablet Commonly known as: LIPITOR Take 10 mg by mouth daily after supper.   Baqsimi Two Pack 3 MG/DOSE Powd Generic drug: Glucagon Place 3 mg into the nose as needed (hypoglycemia).   buPROPion 300 MG 24 hr tablet Commonly known as: WELLBUTRIN XL Take 300 mg by mouth daily after breakfast.   CALCIUM 500 + D PO Take 2 tablets by mouth 2 (two) times daily.   ciprofloxacin 500 MG tablet Commonly known as: Cipro Take 1 tablet (500 mg total) by  mouth 2 (two) times daily for 11 days.   Continuous Glucose Monitor Devi 1 Device by Other route continuous.   docusate sodium 100 MG capsule Commonly known as: COLACE Take 100 mg by mouth every evening.   escitalopram 10 MG tablet Commonly known as: Lexapro Take 1 tablet (10 mg total) by mouth at bedtime.   ferrous sulfate 325 (65 FE) MG tablet Take 325 mg by mouth at bedtime.   fluticasone 50 MCG/ACT nasal spray Commonly known as: FLONASE Place 2 sprays into both nostrils daily as needed for allergies.   Fortify Daily Probiotic Caps Take 1 capsule by mouth daily.   gabapentin 300 MG capsule Commonly known as: NEURONTIN Take 300 mg by mouth 3 (three) times daily.   insulin aspart 100 UNIT/ML injection Commonly known as: novoLOG Continuous with insulin pump   irbesartan 300 MG tablet Commonly known as: AVAPRO Take 300 mg by mouth daily after breakfast.   levocetirizine 5 MG tablet Commonly known as: XYZAL Take 5 mg by mouth daily.   levothyroxine 88 MCG tablet Commonly known as: SYNTHROID Take 88 mcg by mouth daily before breakfast.   levothyroxine 100 MCG tablet Commonly known as: SYNTHROID  Take 100 mcg by mouth daily before breakfast.   Magnesium 250 MG Tabs Take 500 mg by mouth at bedtime.   methocarbamol 500 MG tablet Commonly known as: ROBAXIN Take 1 tablet (500 mg total) by mouth every 6 (six) hours as needed for muscle spasms.   metoCLOPramide 5 MG tablet Commonly known as: REGLAN Take 5 mg by mouth every 6 (six) hours as needed for nausea.   Mounjaro 5 MG/0.5ML Pen Generic drug: tirzepatide Inject 5 mg into the skin once a week.   nitrofurantoin (macrocrystal-monohydrate) 100 MG capsule Commonly known as: MACROBID Take 100 mg by mouth 2 (two) times daily.   ondansetron 8 MG disintegrating tablet Commonly known as: ZOFRAN-ODT Take 1 tablet (8 mg total) by mouth every 8 (eight) hours as needed for nausea or vomiting.   OneTouch Ultra test  strip Generic drug: glucose blood 1 each by Other route 3 (three) times daily.   potassium gluconate 595 (99 K) MG Tabs tablet Take 595 mg by mouth daily after breakfast.   REFRESH OP Place 1 drop into both eyes in the morning and at bedtime.   traMADol 50 MG tablet Commonly known as: Ultram Take 1-2 tablets (50-100 mg total) by mouth every 6 (six) hours as needed.   vitamin C 250 MG tablet Commonly known as: ASCORBIC ACID Take 500 mg by mouth 2 (two) times daily.        Follow-up Information     Avva, Ravisankar, MD. Schedule an appointment as soon as possible for a visit in 1 week(s).   Specialty: Internal Medicine Contact information: [address] [place] [address] [phone]                Allergies  Allergen Reactions   Penicillins Anaphylaxis and Other (See Comments)    CLOSES THROAT PATIENT HAD A PCN REACTION WITH IMMEDIATE RASH, FACIAL/TONGUE/THROAT SWELLING, SOB, OR LIGHTHEADEDNESS WITH HYPOTENSION:  #  #  #  YES  #  #  #   Has patient had a PCN reaction causing severe rash involving mucus membranes or skin necrosis: UNKNOWN  Has patient had a PCN reaction that required hospitalization:No Has patient had a PCN reaction occurring within the last 10 years:No    Cefdinir Diarrhea and Nausea And Vomiting   Hydrocodone-Acetaminophen Nausea And Vomiting    Nausea/vomiting with or without food   Oxycodone Hcl Nausea Only   Adhesive [Tape] Itching and Rash    Consultations: None   Procedures/Studies: US Abdomen Limited RUQ (LIVER/GB)  Result Date: 08/02/2022 CLINICAL DATA:  Transaminitis. EXAM: ULTRASOUND ABDOMEN LIMITED RIGHT UPPER QUADRANT COMPARISON:  CT AP from earlier today FINDINGS: Gallbladder: No gallstones or wall thickening visualized. No sonographic Murphy sign noted by sonographer. Common bile duct: Diameter: 6 mm. Liver: No focal lesion identified. Within normal limits in parenchymal echogenicity. Portal vein is patent on color  Doppler imaging with normal direction of blood flow towards the liver. Other: None. IMPRESSION: Normal right upper quadrant sonogram. Electronically Signed   By: Signa Kell M.D.   On: 08/02/2022 12:32   CT ABDOMEN PELVIS W CONTRAST  Result Date: 08/02/2022 CLINICAL DATA:  Abdominal pain and possible UTI for 3 days. EXAM: CT ABDOMEN AND PELVIS WITH CONTRAST TECHNIQUE: Multidetector CT imaging of the abdomen and pelvis was performed using the standard protocol following bolus administration of intravenous contrast. RADIATION DOSE REDUCTION: This exam was performed according to the departmental dose-optimization program which includes automated exposure control, adjustment of the mA and/or kV according to  patient size and/or use of iterative reconstruction technique. CONTRAST:  OMNIPAQUE IOHEXOL 300 MG/ML  SOLN COMPARISON:  06/16/2021 FINDINGS: Lower chest: No acute abnormality. Hepatobiliary: Hemangioma in the lateral segment of left hepatic lobe is unchanged measuring 1.2 cm, image 17/2. No suspicious liver lesion identified. Gallbladder appears normal. No bile duct dilatation. Pancreas: Unremarkable. No pancreatic ductal dilatation or surrounding inflammatory changes. Spleen: Choose 1 Adrenals/Urinary Tract: Normal adrenal glands. There is mild perinephric soft tissue stranding about the inferior pole of both kidneys. Equivocal striated nephrogram on the delayed images within the left kidney. No mass or hydronephrosis identified. No nephrolithiasis. Urinary bladder appears normal. Stomach/Bowel: Small hiatal hernia. Stomach is nondistended. No pathologic dilatation of the large or small bowel loops. Moderate retained stool identified within the colon. No bowel wall thickening or inflammation identified. The appendix is visualized and appears normal. Vascular/Lymphatic: Aortic atherosclerosis. No aneurysm. No signs of retroperitoneal, mesenteric or pelvic adenopathy. Reproductive: Uterus and bilateral  adnexa are unremarkable. Other: Small fat containing umbilical hernia. No abdominopelvic ascites. Musculoskeletal: Thoracolumbar scoliosis deformity identified. Status post posterior hardware fixation of L1 through the sacrum. Solid fusion of the L1 through L5 vertebra identified. Marked degenerative disc disease is noted at T12-L1. IMPRESSION: 1. Mild perinephric soft tissue stranding about the inferior pole of both kidneys. Equivocal striated nephrogram on the delayed images within the left kidney. Correlate for any clinical signs or symptoms of pyelonephritis. 2. Moderate retained stool identified within the colon. Correlate for any clinical signs or symptoms of constipation. 3. Small hiatal hernia. 4. Small fat containing umbilical hernia. 5. Thoracolumbar scoliosis deformity status post posterior hardware fixation of L1 through the sacrum. Solid fusion of the L1 through L5 vertebra identified. Marked degenerative disc disease is noted at T12-L1. 6.  Aortic Atherosclerosis (ICD10-I70.0). Electronically Signed   By: Signa Kell M.D.   On: 08/02/2022 11:44   DG Chest 2 View  Result Date: 08/02/2022 CLINICAL DATA:  Suspected sepsis.  Dehydration and possible UTI. EXAM: CHEST - 2 VIEW COMPARISON:  None Available. FINDINGS: Normal heart size and mediastinal contours. No pleural fluid or airspace disease. No signs of interstitial edema. Thoracolumbar scoliosis with spinal fixation hardware noted in the lumbar spine. IMPRESSION: No active cardiopulmonary abnormalities. Electronically Signed   By: Signa Kell M.D.   On: 08/02/2022 09:38     Subjective: Patient seen and examined at bedside, resting comfortably.  Lying in bed.  No specific complaints this morning.  Specifically headache, nausea, fever have subsided.  Tolerating diet.  Continues with mild back pain which is improved.  Ready for discharge home.  No other specific questions or concerns at this time.  Denies headache, no dizziness, no chest  pain, no palpitations, no shortness of breath, no abdominal pain, no fever/chills/night sweats, no nausea/vomiting/diarrhea, no focal weakness, no fatigue, no paresthesias.  No acute events overnight per nursing staff.  Discharge Exam: Vitals:   08/04/22 2059 08/05/22 0406  BP: 131/76 136/63  Pulse: 82 88  Resp: 18 17  Temp: 99.2 F (37.3 C) 98.9 F (37.2 C)  SpO2: 100% 97%   Vitals:   08/04/22 0453 08/04/22 1422 08/04/22 2059 08/05/22 0406  BP: (!) 122/55 (!) 105/57 131/76 136/63  Pulse: 87 69 82 88  Resp: 18 20 18 17   Temp: 98.2 F (36.8 C) 98.2 F (36.8 C) 99.2 F (37.3 C) 98.9 F (37.2 C)  TempSrc: Oral Oral  Oral  SpO2: 98% 100% 100% 97%  Weight:      Height:  Physical Exam: GEN: NAD, alert and oriented x 3, wd/wn HEENT: NCAT, PERRL, EOMI, sclera clear, MMM PULM: CTAB w/o wheezes/crackles, normal respiratory effort, on room air CV: RRR w/o M/G/R GI: abd soft, NTND, NABS, no R/G/M MSK: no peripheral edema, muscle strength globally intact 5/5 bilateral upper/lower extremities NEURO: CN II-XII intact, no focal deficits, sensation to light touch intact PSYCH: normal mood/affect Integumentary: dry/intact, no rashes or wounds    The results of significant diagnostics from this hospitalization (including imaging, microbiology, ancillary and laboratory) are listed below for reference.     Microbiology: Recent Results (from the past 240 hour(s))  Urine Culture     Status: Abnormal   Collection Time: 08/02/22  8:30 AM   Specimen: Urine, Random  Result Value Ref Range Status   Specimen Description   Final    URINE, RANDOM Performed at [facility], [address] [place], [place] [address]    Special Requests   Final    URINE, CLEAN CATCH Performed at [facility], [address] [place], [place] [address]    Culture MULTIPLE SPECIES PRESENT, SUGGEST RECOLLECTION (A)  Final   Report Status 08/03/2022 FINAL  Final     Labs: BNP  (last 3 results) No results for input(s): "BNP" in the last 8760 hours. Basic Metabolic Panel: Recent Labs  Lab 08/02/22 0819 08/03/22 0344 08/04/22 0338 08/05/22 0350  NA 131* 130* 129* 135  K 3.6 4.1 4.2 4.7  CL 98 99 102 105  CO2 22 23 20* 23  GLUCOSE 105* 122* 98 135*  BUN 13 12 11 10   CREATININE 0.82 0.91 0.94 0.88  CALCIUM 9.1 8.4* 8.3* 9.0  MG  --   --  1.6*  --    Liver Function Tests: Recent Labs  Lab 08/02/22 0819 08/03/22 0344 08/04/22 0338 08/05/22 0350  AST 78* 70* 70* 73*  ALT 63* 58* 65* 75*  ALKPHOS 115 90 151* 197*  BILITOT 1.9* 1.5* 1.0 0.3  PROT 6.4* 5.1* 5.4* 5.8*  ALBUMIN 3.6 2.6* 2.7* 2.8*   Recent Labs  Lab 08/02/22 0819  LIPASE 31   No results for input(s): "AMMONIA" in the last 168 hours. CBC: Recent Labs  Lab 08/02/22 0819 08/03/22 0344 08/04/22 0338 08/05/22 0350  WBC 11.0* 9.2 5.9 4.4  NEUTROABS 9.4*  --   --   --   HGB 11.5* 8.9* 9.0* 9.2*  HCT 34.0* 27.4* 27.9* 28.4*  MCV 97.1 98.9 99.3 99.3  PLT 171 144* 141* 145*   Cardiac Enzymes: No results for input(s): "CKTOTAL", "CKMB", "CKMBINDEX", "TROPONINI" in the last 168 hours. BNP: Invalid input(s): "POCBNP" CBG: Recent Labs  Lab 08/04/22 0135 08/04/22 1704 08/04/22 2153 08/05/22 0159 08/05/22 0838  GLUCAP 76 88 170* 93 93   D-Dimer No results for input(s): "DDIMER" in the last 72 hours. Hgb A1c No results for input(s): "HGBA1C" in the last 72 hours. Lipid Profile No results for input(s): "CHOL", "HDL", "LDLCALC", "TRIG", "CHOLHDL", "LDLDIRECT" in the last 72 hours. Thyroid function studies No results for input(s): "TSH", "T4TOTAL", "T3FREE", "THYROIDAB" in the last 72 hours.  Invalid input(s): "FREET3" Anemia work up No results for input(s): "VITAMINB12", "FOLATE", "FERRITIN", "TIBC", "IRON", "RETICCTPCT" in the last 72 hours. Urinalysis    Component Value Date/Time   COLORURINE YELLOW 08/02/2022 0830   APPEARANCEUR CLEAR 08/02/2022 0830   LABSPEC 1.016  08/02/2022 0830   PHURINE 6.0 08/02/2022 0830   GLUCOSEU NEGATIVE 08/02/2022 0830   HGBUR NEGATIVE 08/02/2022 0830  BILIRUBINUR NEGATIVE 08/02/2022 0830   KETONESUR 5 (A) 08/02/2022 0830   PROTEINUR 100 (A) 08/02/2022 0830   NITRITE NEGATIVE 08/02/2022 0830   LEUKOCYTESUR MODERATE (A) 08/02/2022 0830   Sepsis Labs Recent Labs  Lab 08/02/22 0819 08/03/22 0344 08/04/22 0338 08/05/22 0350  WBC 11.0* 9.2 5.9 4.4   Microbiology Recent Results (from the past 240 hour(s))  Urine Culture     Status: Abnormal   Collection Time: 08/02/22  8:30 AM   Specimen: Urine, Random  Result Value Ref Range Status   Specimen Description   Final    URINE, RANDOM Performed at [facility], [address] [place], [place] [address]    Special Requests   Final    URINE, CLEAN CATCH Performed at [facility], [address], [place], [place] [address]    Culture MULTIPLE SPECIES PRESENT, SUGGEST RECOLLECTION (A)  Final   Report Status 08/03/2022 FINAL  Final     Time coordinating discharge: Over 30 minutes  SIGNED:   Alvira Philips [place], DO  [facility] 08/05/2022, 2:34 PM

## 2022-08-05 NOTE — Progress Notes (Signed)
Physician Discharge Summary  Colleen Zuniga UJW:[id number] DOB: [birth year] DOA: 08/02/2022  PCP: Chilton Greathouse, MD  Admit date: 08/02/2022 Discharge date: 08/05/2022  Admitted From: Home Disposition: Home  Recommendations for Outpatient Follow-up:  Follow up with PCP in 1-2 weeks Continue ciprofloxacin to complete 14-day course for acute pyelonephritis Continue to hold home Tylenol use and to follow-up with PCP, recommend repeat CMP at visit for elevated LFTs  Home Health: No Equipment/Devices: None  Discharge Condition: Stable CODE STATUS: Full code Diet recommendation: Heart healthy diet  History of present illness:  Colleen Zuniga is a 62 y.o. female with past medical history significant for type 1 diabetes mellitus on insulin pump, anxiety/depression, HTN, hypothyroidism, GERD who presented to [facility] ED on 5/25 for evaluation of fever, dysuria, back pain, nausea and vomiting.  Patient was seen by her PCP 3 weeks prior, diagnosed with UTI and completed a course of oral Macrobid.  Initially she felt better about 3 days ago she forgot to place her insulin pump and her monitor was reading high for the majority of the day.  Following improvement of her glucose after replacing insulin pump, she developed nausea, vomiting and cloudy urine.  Her PCP once again started her on oral Macrobid after obtaining a urine culture but she presents to the ED for further evaluation given fever, nausea, vomiting and unable to tolerate oral intake.  Denies abdominal pain.   In the ED, temperature 100.6 F, HR 83, RR 18, BP 130/57, SpO2 100% on room air.  WBC 11.0, hemoglobin 11.5, platelets 171.  Sodium 131, potassium 3.6, chloride 98, CO2 22, glucose 105, BUN 13, creatinine 0.82.  AST 78, ALT 63, total bilirubin 1.9.  Urinalysis with moderate leukocytes, negative nitrite, rare bacteria, greater than 50 WBCs.  Ultrasound abdomen with no acute findings.  CT abdomen/pelvis with contrast  with mild perinephric soft tissue stranding about the inferior pole of both kidneys consistent with pyelonephritis, moderate retained stool within the colon.  Patient was started on IV ciprofloxacin given severe allergy to penicillin.  TRH consulted for admission.  Hospital course:  Acute pyelonephritis Presenting with fever, nausea/vomiting and inability to tolerate oral intake.  Urinalysis consistent with infection.  Patient with fever 100.6, WBC count elevated 11.0 and CT abdomen/pelvis with findings consistent with acute pyelonephritis.  Urine culture with multiple species present.  Patient was started on ciprofloxacin with improvement of white blood cell count to 4.4 at time of discharge.  Patient has been afebrile for 48 hours.  Will continue ciprofloxacin 500 mg p.o. twice daily to complete 14-day course.  Outpatient follow-up with PCP.   Transaminitis Etiology likely secondary to continued Tylenol use outpatient versus active infection as above.  Patient instructed to hold home Tylenol use on discharge.  Recommend outpatient follow-up with PCP with repeat CMP 1 week.   Hyponatremia Sodium 131 on admission, likely secondary to hypovolemic hyponatremia in the setting of nausea/vomiting secondary to pyelonephritis.  Recommend repeat CMP as above 1 week.   Type 1 diabetes mellitus Hemoglobin A1c 5.9 on 03/20/2022, well-controlled.  Continue insulin pump   Essential hypertension Continue irbesartan 300 mg p.o. daily   Hypothyroidism Continue levothyroxine 88 mcg p.o. daily   Anxiety/depression Continue Wellbutrin 300 mg p.o. daily, Lexapro 10 mg p.o. daily, Xanax 0.5 mg every 6 hours as needed anxiety  Discharge Diagnoses:  Principal Problem:   Sepsis (HCC) Active Problems:   Acute pyelonephritis   Type 1 diabetes mellitus without complications (HCC)   GAD (generalized  anxiety disorder)   LFTs abnormal   Pyelonephritis    Discharge Instructions  Discharge Instructions      Diet - low sodium heart healthy   Complete by: As directed    Increase activity slowly   Complete by: As directed       Allergies as of 08/05/2022       Reactions   Penicillins Anaphylaxis, Other (See Comments)   CLOSES THROAT PATIENT HAD A PCN REACTION WITH IMMEDIATE RASH, FACIAL/TONGUE/THROAT SWELLING, SOB, OR LIGHTHEADEDNESS WITH HYPOTENSION:  #  #  #  YES  #  #  #   Has patient had a PCN reaction causing severe rash involving mucus membranes or skin necrosis: UNKNOWN  Has patient had a PCN reaction that required hospitalization:No Has patient had a PCN reaction occurring within the last 10 years:No   Cefdinir Diarrhea, Nausea And Vomiting   Hydrocodone-acetaminophen Nausea And Vomiting   Nausea/vomiting with or without food   Oxycodone Hcl Nausea Only   Adhesive [tape] Itching, Rash        Medication List     STOP taking these medications    acetaminophen 500 MG tablet Commonly known as: TYLENOL   oxyCODONE 5 MG immediate release tablet Commonly known as: Oxy IR/ROXICODONE       TAKE these medications    ADULT GUMMY PO Take 2 tablets by mouth daily.   AIRBORNE PO Take 2 tablets by mouth daily.   albuterol 108 (90 Base) MCG/ACT inhaler Commonly known as: VENTOLIN HFA Inhale 2 puffs into the lungs every 4 (four) hours as needed for shortness of breath or wheezing.   ALPRAZolam 0.5 MG tablet Commonly known as: XANAX Take 0.5 mg by mouth every 6 (six) hours as needed for sleep or anxiety.   atorvastatin 10 MG tablet Commonly known as: LIPITOR Take 10 mg by mouth daily after supper.   Baqsimi Two Pack 3 MG/DOSE Powd Generic drug: Glucagon Place 3 mg into the nose as needed (hypoglycemia).   buPROPion 300 MG 24 hr tablet Commonly known as: WELLBUTRIN XL Take 300 mg by mouth daily after breakfast.   CALCIUM 500 + D PO Take 2 tablets by mouth 2 (two) times daily.   ciprofloxacin 500 MG tablet Commonly known as: Cipro Take 1 tablet (500 mg total) by  mouth 2 (two) times daily for 11 days.   Continuous Glucose Monitor Devi 1 Device by Other route continuous.   docusate sodium 100 MG capsule Commonly known as: COLACE Take 100 mg by mouth every evening.   escitalopram 10 MG tablet Commonly known as: Lexapro Take 1 tablet (10 mg total) by mouth at bedtime.   ferrous sulfate 325 (65 FE) MG tablet Take 325 mg by mouth at bedtime.   fluticasone 50 MCG/ACT nasal spray Commonly known as: FLONASE Place 2 sprays into both nostrils daily as needed for allergies.   Fortify Daily Probiotic Caps Take 1 capsule by mouth daily.   gabapentin 300 MG capsule Commonly known as: NEURONTIN Take 300 mg by mouth 3 (three) times daily.   insulin aspart 100 UNIT/ML injection Commonly known as: novoLOG Continuous with insulin pump   irbesartan 300 MG tablet Commonly known as: AVAPRO Take 300 mg by mouth daily after breakfast.   levocetirizine 5 MG tablet Commonly known as: XYZAL Take 5 mg by mouth daily.   levothyroxine 88 MCG tablet Commonly known as: SYNTHROID Take 88 mcg by mouth daily before breakfast.   levothyroxine 100 MCG tablet Commonly known as: SYNTHROID  Take 100 mcg by mouth daily before breakfast.   Magnesium 250 MG Tabs Take 500 mg by mouth at bedtime.   methocarbamol 500 MG tablet Commonly known as: ROBAXIN Take 1 tablet (500 mg total) by mouth every 6 (six) hours as needed for muscle spasms.   metoCLOPramide 5 MG tablet Commonly known as: REGLAN Take 5 mg by mouth every 6 (six) hours as needed for nausea.   Mounjaro 5 MG/0.5ML Pen Generic drug: tirzepatide Inject 5 mg into the skin once a week.   nitrofurantoin (macrocrystal-monohydrate) 100 MG capsule Commonly known as: MACROBID Take 100 mg by mouth 2 (two) times daily.   ondansetron 8 MG disintegrating tablet Commonly known as: ZOFRAN-ODT Take 1 tablet (8 mg total) by mouth every 8 (eight) hours as needed for nausea or vomiting.   OneTouch Ultra test  strip Generic drug: glucose blood 1 each by Other route 3 (three) times daily.   potassium gluconate 595 (99 K) MG Tabs tablet Take 595 mg by mouth daily after breakfast.   REFRESH OP Place 1 drop into both eyes in the morning and at bedtime.   traMADol 50 MG tablet Commonly known as: Ultram Take 1-2 tablets (50-100 mg total) by mouth every 6 (six) hours as needed.   vitamin C 250 MG tablet Commonly known as: ASCORBIC ACID Take 500 mg by mouth 2 (two) times daily.        Follow-up Information     Avva, Ravisankar, MD. Schedule an appointment as soon as possible for a visit in 1 week(s).   Specialty: Internal Medicine Contact information: [address] [place] [address] [phone]                Allergies  Allergen Reactions   Penicillins Anaphylaxis and Other (See Comments)    CLOSES THROAT PATIENT HAD A PCN REACTION WITH IMMEDIATE RASH, FACIAL/TONGUE/THROAT SWELLING, SOB, OR LIGHTHEADEDNESS WITH HYPOTENSION:  #  #  #  YES  #  #  #   Has patient had a PCN reaction causing severe rash involving mucus membranes or skin necrosis: UNKNOWN  Has patient had a PCN reaction that required hospitalization:No Has patient had a PCN reaction occurring within the last 10 years:No    Cefdinir Diarrhea and Nausea And Vomiting   Hydrocodone-Acetaminophen Nausea And Vomiting    Nausea/vomiting with or without food   Oxycodone Hcl Nausea Only   Adhesive [Tape] Itching and Rash    Consultations: None   Procedures/Studies: US Abdomen Limited RUQ (LIVER/GB)  Result Date: 08/02/2022 CLINICAL DATA:  Transaminitis. EXAM: ULTRASOUND ABDOMEN LIMITED RIGHT UPPER QUADRANT COMPARISON:  CT AP from earlier today FINDINGS: Gallbladder: No gallstones or wall thickening visualized. No sonographic Murphy sign noted by sonographer. Common bile duct: Diameter: 6 mm. Liver: No focal lesion identified. Within normal limits in parenchymal echogenicity. Portal vein is patent on color  Doppler imaging with normal direction of blood flow towards the liver. Other: None. IMPRESSION: Normal right upper quadrant sonogram. Electronically Signed   By: Signa Kell M.D.   On: 08/02/2022 12:32   CT ABDOMEN PELVIS W CONTRAST  Result Date: 08/02/2022 CLINICAL DATA:  Abdominal pain and possible UTI for 3 days. EXAM: CT ABDOMEN AND PELVIS WITH CONTRAST TECHNIQUE: Multidetector CT imaging of the abdomen and pelvis was performed using the standard protocol following bolus administration of intravenous contrast. RADIATION DOSE REDUCTION: This exam was performed according to the departmental dose-optimization program which includes automated exposure control, adjustment of the mA and/or kV according to  patient size and/or use of iterative reconstruction technique. CONTRAST:  OMNIPAQUE IOHEXOL 300 MG/ML  SOLN COMPARISON:  06/16/2021 FINDINGS: Lower chest: No acute abnormality. Hepatobiliary: Hemangioma in the lateral segment of left hepatic lobe is unchanged measuring 1.2 cm, image 17/2. No suspicious liver lesion identified. Gallbladder appears normal. No bile duct dilatation. Pancreas: Unremarkable. No pancreatic ductal dilatation or surrounding inflammatory changes. Spleen: Choose 1 Adrenals/Urinary Tract: Normal adrenal glands. There is mild perinephric soft tissue stranding about the inferior pole of both kidneys. Equivocal striated nephrogram on the delayed images within the left kidney. No mass or hydronephrosis identified. No nephrolithiasis. Urinary bladder appears normal. Stomach/Bowel: Small hiatal hernia. Stomach is nondistended. No pathologic dilatation of the large or small bowel loops. Moderate retained stool identified within the colon. No bowel wall thickening or inflammation identified. The appendix is visualized and appears normal. Vascular/Lymphatic: Aortic atherosclerosis. No aneurysm. No signs of retroperitoneal, mesenteric or pelvic adenopathy. Reproductive: Uterus and bilateral  adnexa are unremarkable. Other: Small fat containing umbilical hernia. No abdominopelvic ascites. Musculoskeletal: Thoracolumbar scoliosis deformity identified. Status post posterior hardware fixation of L1 through the sacrum. Solid fusion of the L1 through L5 vertebra identified. Marked degenerative disc disease is noted at T12-L1. IMPRESSION: 1. Mild perinephric soft tissue stranding about the inferior pole of both kidneys. Equivocal striated nephrogram on the delayed images within the left kidney. Correlate for any clinical signs or symptoms of pyelonephritis. 2. Moderate retained stool identified within the colon. Correlate for any clinical signs or symptoms of constipation. 3. Small hiatal hernia. 4. Small fat containing umbilical hernia. 5. Thoracolumbar scoliosis deformity status post posterior hardware fixation of L1 through the sacrum. Solid fusion of the L1 through L5 vertebra identified. Marked degenerative disc disease is noted at T12-L1. 6.  Aortic Atherosclerosis (ICD10-I70.0). Electronically Signed   By: Signa Kell M.D.   On: 08/02/2022 11:44   DG Chest 2 View  Result Date: 08/02/2022 CLINICAL DATA:  Suspected sepsis.  Dehydration and possible UTI. EXAM: CHEST - 2 VIEW COMPARISON:  None Available. FINDINGS: Normal heart size and mediastinal contours. No pleural fluid or airspace disease. No signs of interstitial edema. Thoracolumbar scoliosis with spinal fixation hardware noted in the lumbar spine. IMPRESSION: No active cardiopulmonary abnormalities. Electronically Signed   By: Signa Kell M.D.   On: 08/02/2022 09:38     Subjective: Patient seen and examined at bedside, resting comfortably.  Lying in bed.  No specific complaints this morning.  Specifically headache, nausea, fever have subsided.  Tolerating diet.  Continues with mild back pain which is improved.  Ready for discharge home.  No other specific questions or concerns at this time.  Denies headache, no dizziness, no chest  pain, no palpitations, no shortness of breath, no abdominal pain, no fever/chills/night sweats, no nausea/vomiting/diarrhea, no focal weakness, no fatigue, no paresthesias.  No acute events overnight per nursing staff.  Discharge Exam: Vitals:   08/04/22 2059 08/05/22 0406  BP: 131/76 136/63  Pulse: 82 88  Resp: 18 17  Temp: 99.2 F (37.3 C) 98.9 F (37.2 C)  SpO2: 100% 97%   Vitals:   08/04/22 0453 08/04/22 1422 08/04/22 2059 08/05/22 0406  BP: (!) 122/55 (!) 105/57 131/76 136/63  Pulse: 87 69 82 88  Resp: 18 20 18 17   Temp: 98.2 F (36.8 C) 98.2 F (36.8 C) 99.2 F (37.3 C) 98.9 F (37.2 C)  TempSrc: Oral Oral  Oral  SpO2: 98% 100% 100% 97%  Weight:      Height:  Physical Exam: GEN: NAD, alert and oriented x 3, wd/wn HEENT: NCAT, PERRL, EOMI, sclera clear, MMM PULM: CTAB w/o wheezes/crackles, normal respiratory effort, on room air CV: RRR w/o M/G/R GI: abd soft, NTND, NABS, no R/G/M MSK: no peripheral edema, muscle strength globally intact 5/5 bilateral upper/lower extremities NEURO: CN II-XII intact, no focal deficits, sensation to light touch intact PSYCH: normal mood/affect Integumentary: dry/intact, no rashes or wounds    The results of significant diagnostics from this hospitalization (including imaging, microbiology, ancillary and laboratory) are listed below for reference.     Microbiology: Recent Results (from the past 240 hour(s))  Urine Culture     Status: Abnormal   Collection Time: 08/02/22  8:30 AM   Specimen: Urine, Random  Result Value Ref Range Status   Specimen Description   Final    URINE, RANDOM Performed at [facility], [address] [place], [place] [address]    Special Requests   Final    URINE, CLEAN CATCH Performed at [facility], [address], [place], [place] [address]    Culture MULTIPLE SPECIES PRESENT, SUGGEST RECOLLECTION (A)  Final   Report Status 08/03/2022 FINAL  Final     Labs: BNP  (last 3 results) No results for input(s): "BNP" in the last 8760 hours. Basic Metabolic Panel: Recent Labs  Lab 08/02/22 0819 08/03/22 0344 08/04/22 0338 08/05/22 0350  NA 131* 130* 129* 135  K 3.6 4.1 4.2 4.7  CL 98 99 102 105  CO2 22 23 20* 23  GLUCOSE 105* 122* 98 135*  BUN 13 12 11 10   CREATININE 0.82 0.91 0.94 0.88  CALCIUM 9.1 8.4* 8.3* 9.0  MG  --   --  1.6*  --    Liver Function Tests: Recent Labs  Lab 08/02/22 0819 08/03/22 0344 08/04/22 0338 08/05/22 0350  AST 78* 70* 70* 73*  ALT 63* 58* 65* 75*  ALKPHOS 115 90 151* 197*  BILITOT 1.9* 1.5* 1.0 0.3  PROT 6.4* 5.1* 5.4* 5.8*  ALBUMIN 3.6 2.6* 2.7* 2.8*   Recent Labs  Lab 08/02/22 0819  LIPASE 31   No results for input(s): "AMMONIA" in the last 168 hours. CBC: Recent Labs  Lab 08/02/22 0819 08/03/22 0344 08/04/22 0338 08/05/22 0350  WBC 11.0* 9.2 5.9 4.4  NEUTROABS 9.4*  --   --   --   HGB 11.5* 8.9* 9.0* 9.2*  HCT 34.0* 27.4* 27.9* 28.4*  MCV 97.1 98.9 99.3 99.3  PLT 171 144* 141* 145*   Cardiac Enzymes: No results for input(s): "CKTOTAL", "CKMB", "CKMBINDEX", "TROPONINI" in the last 168 hours. BNP: Invalid input(s): "POCBNP" CBG: Recent Labs  Lab 08/04/22 0135 08/04/22 1704 08/04/22 2153 08/05/22 0159 08/05/22 0838  GLUCAP 76 88 170* 93 93   D-Dimer No results for input(s): "DDIMER" in the last 72 hours. Hgb A1c No results for input(s): "HGBA1C" in the last 72 hours. Lipid Profile No results for input(s): "CHOL", "HDL", "LDLCALC", "TRIG", "CHOLHDL", "LDLDIRECT" in the last 72 hours. Thyroid function studies No results for input(s): "TSH", "T4TOTAL", "T3FREE", "THYROIDAB" in the last 72 hours.  Invalid input(s): "FREET3" Anemia work up No results for input(s): "VITAMINB12", "FOLATE", "FERRITIN", "TIBC", "IRON", "RETICCTPCT" in the last 72 hours. Urinalysis    Component Value Date/Time   COLORURINE YELLOW 08/02/2022 0830   APPEARANCEUR CLEAR 08/02/2022 0830   LABSPEC 1.016  08/02/2022 0830   PHURINE 6.0 08/02/2022 0830   GLUCOSEU NEGATIVE 08/02/2022 0830   HGBUR NEGATIVE 08/02/2022 0830  BILIRUBINUR NEGATIVE 08/02/2022 0830   KETONESUR 5 (A) 08/02/2022 0830   PROTEINUR 100 (A) 08/02/2022 0830   NITRITE NEGATIVE 08/02/2022 0830   LEUKOCYTESUR MODERATE (A) 08/02/2022 0830   Sepsis Labs Recent Labs  Lab 08/02/22 0819 08/03/22 0344 08/04/22 0338 08/05/22 0350  WBC 11.0* 9.2 5.9 4.4   Microbiology Recent Results (from the past 240 hour(s))  Urine Culture     Status: Abnormal   Collection Time: 08/02/22  8:30 AM   Specimen: Urine, Random  Result Value Ref Range Status   Specimen Description   Final    URINE, RANDOM Performed at [facility], [address], [place], [place] [address]    Special Requests   Final    URINE, CLEAN CATCH Performed at [facility], [address] [place], [place] [address]    Culture MULTIPLE SPECIES PRESENT, SUGGEST RECOLLECTION (A)  Final   Report Status 08/03/2022 FINAL  Final     Time coordinating discharge: Over 30 minutes  SIGNED:   Alvira Philips [place], DO  [facility] 08/05/2022, 8:42 AM

## 2022-08-05 NOTE — Progress Notes (Signed)
Patient's antibiotic infusion is completed. Discharge education completed with verbalized understanding from patient. IV removed.

## 2022-08-05 NOTE — Discharge Instructions (Signed)
Continue to hold home Tylenol use until follow-up with PCP for repeat labs; due to elevated liver function tests noted during hospitalization.

## 2022-08-06 ENCOUNTER — Encounter (HOSPITAL_COMMUNITY): Payer: Self-pay

## 2022-08-06 ENCOUNTER — Emergency Department (HOSPITAL_COMMUNITY)
Admission: EM | Admit: 2022-08-06 | Discharge: 2022-08-07 | Disposition: A | Discharge status: Home / Self Care | Payer: Medicare Other | Attending: Emergency Medicine | Admitting: Emergency Medicine

## 2022-08-06 ENCOUNTER — Other Ambulatory Visit: Payer: Self-pay

## 2022-08-06 ENCOUNTER — Emergency Department (HOSPITAL_COMMUNITY): Payer: Medicare Other

## 2022-08-06 DIAGNOSIS — E039 Hypothyroidism, unspecified: Secondary | ICD-10-CM | POA: Diagnosis not present

## 2022-08-06 DIAGNOSIS — E109 Type 1 diabetes mellitus without complications: Secondary | ICD-10-CM | POA: Diagnosis not present

## 2022-08-06 DIAGNOSIS — R600 Localized edema: Secondary | ICD-10-CM | POA: Diagnosis not present

## 2022-08-06 DIAGNOSIS — Z794 Long term (current) use of insulin: Secondary | ICD-10-CM | POA: Diagnosis not present

## 2022-08-06 DIAGNOSIS — R2243 Localized swelling, mass and lump, lower limb, bilateral: Secondary | ICD-10-CM | POA: Diagnosis present

## 2022-08-06 LAB — CBC WITH DIFFERENTIAL/PLATELET
Abs Immature Granulocytes: 0.02 10*3/uL (ref 0.00–0.07)
Basophils Absolute: 0 10*3/uL (ref 0.0–0.1)
Basophils Relative: 0 %
Eosinophils Absolute: 0.2 10*3/uL (ref 0.0–0.5)
Eosinophils Relative: 3 %
HCT: 27.1 % — ABNORMAL LOW (ref 36.0–46.0)
Hemoglobin: 8.9 g/dL — ABNORMAL LOW (ref 12.0–15.0)
Immature Granulocytes: 0 %
Lymphocytes Relative: 31 %
Lymphs Abs: 1.7 10*3/uL (ref 0.7–4.0)
MCH: 32 pg (ref 26.0–34.0)
MCHC: 32.8 g/dL (ref 30.0–36.0)
MCV: 97.5 fL (ref 80.0–100.0)
Monocytes Absolute: 0.7 10*3/uL (ref 0.1–1.0)
Monocytes Relative: 13 %
Neutro Abs: 2.7 10*3/uL (ref 1.7–7.7)
Neutrophils Relative %: 53 %
Platelets: 188 10*3/uL (ref 150–400)
RBC: 2.78 MIL/uL — ABNORMAL LOW (ref 3.87–5.11)
RDW: 12 % (ref 11.5–15.5)
WBC: 5.3 10*3/uL (ref 4.0–10.5)
nRBC: 0 % (ref 0.0–0.2)

## 2022-08-06 NOTE — ED Triage Notes (Signed)
Pt to ED by EMS from home with c/o bilateral non-pitting LE edema. Pt was just Dc'd from here yesterday following a 4 day stay for sepsis. Pt also endorses a cough a 17lb weight gain. No hx of edema or CHF. Arrives A+O, VSS, NADN.

## 2022-08-06 NOTE — ED Provider Notes (Signed)
[facility] Provider Note  CSN: [id number] Arrival date & time: 08/06/22 2235  Chief Complaint(s) Leg Swelling  HPI Colleen Zuniga is a 62 y.o. female here for BLE edema   Onset/Duration: 24 hrs Timing: constant Location: BLE Severity: moderate to severe Modifying Factors:  Improved by: nothing  Worsened by: nothing Associated Signs/Symptoms:  Pertinent (+): weight gain, cough (dry), SOB after coughing or speaking for awhile (not exertional). Abd bloating. Still having back pain. Headache returned  Pertinent (-): CP, fever, Context: recently admitted for urosepsis. DC'd yesterday. Still on cipro     The history is provided by the patient.    Past Medical History Past Medical History:  Diagnosis Date   Anemia    Anxiety    Arthritis    Back pain    Back pain    De Quervain's tenosynovitis, right 01/2012   Degenerative disc disease, cervical    states neck is stiff, reduced range of motion   Dental crowns present    Depression    Diabetes mellitus    Insulin pump   GERD (gastroesophageal reflux disease)    Heart murmur    states has a functional murmur, and that she has never had any problems   Hypothyroidism    Joint pain    Osteoarthritis    PONV (postoperative nausea and vomiting)    Patient Active Problem List   Diagnosis Date Noted   Sepsis (HCC) 08/02/2022   Acute pyelonephritis 08/02/2022   LFTs abnormal 08/02/2022   Pyelonephritis 08/02/2022   Generalized obesity 04/29/2022   Osteoarthritis of right knee 03/31/2022   Primary osteoarthritis of right knee 03/31/2022   Osteoarthritis of knee 03/25/2022   Caregiver stress 03/25/2022   GAD (generalized anxiety disorder) 03/25/2022   Primary osteoarthritis of left knee 12/23/2021   Osteoarthritis of left knee 12/19/2021   Other specified hypothyroidism 12/19/2021   Type 1 diabetes mellitus without complications (HCC) 11/21/2021   Osteoarthritis of both  knees 11/21/2021   Hypothyroidism 09/18/2021   Type 1 diabetes mellitus with other specified complication (HCC) 09/18/2021   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 Authorizing Provider  albuterol (VENTOLIN HFA) 108 (90 Base) MCG/ACT inhaler Inhale 2 puffs into the lungs every 4 (four) hours as needed for shortness of breath or wheezing. 04/08/19   [provider]  ALPRAZolam Prudy Feeler) 0.5 MG tablet Take 0.5 mg by mouth every 6 (six) hours as needed for sleep or anxiety. 05/25/16   [provider]  atorvastatin (LIPITOR) 10 MG tablet Take 10 mg by mouth daily after supper.    [provider]  BAQSIMI TWO PACK 3 MG/DOSE POWD Place 3 mg into the nose as needed (hypoglycemia). 06/08/19   [provider]  buPROPion (WELLBUTRIN XL) 300 MG 24 hr tablet Take 300 mg by mouth daily after breakfast.    [provider]  Calcium Carb-Cholecalciferol (CALCIUM 500 + D PO) Take 2 tablets by mouth 2 (two) times daily.    [provider]  ciprofloxacin (CIPRO) 500 MG tablet Take 1 tablet (500 mg total) by mouth 2 (two) times daily for 11 days. 08/05/22 08/16/22  [place], Eric J,  DO  Continuous Glucose Monitor DEVI 1 Device by Other route continuous.    [provider]  docusate sodium (COLACE) 100 MG capsule Take 100 mg by mouth every evening.    [provider]  escitalopram (LEXAPRO) 10 MG tablet Take 1 tablet (10 mg total) by mouth at bedtime. 04/29/22   Bowen, Scot Jun, DO  ferrous sulfate 325 (65 FE) MG tablet Take 325 mg by mouth at bedtime.    [provider]  fluticasone (FLONASE) 50 MCG/ACT nasal spray Place 2 sprays into both  nostrils daily as needed for allergies.    [provider]  gabapentin (NEURONTIN) 300 MG capsule Take 300 mg by mouth 3 (three) times daily.    [provider]  insulin aspart (NOVOLOG) 100 UNIT/ML injection Continuous with insulin pump    [provider]  irbesartan (AVAPRO) 300 MG tablet Take 300 mg by mouth daily after breakfast.    [provider]  levocetirizine (XYZAL) 5 MG tablet Take 5 mg by mouth daily.    [provider]  levothyroxine (SYNTHROID) 100 MCG tablet Take 100 mcg by mouth daily before breakfast.    [provider]  levothyroxine (SYNTHROID) 88 MCG tablet Take 88 mcg by mouth daily before breakfast. Patient not taking: Reported on 08/02/2022    [provider]  Magnesium 250 MG TABS Take 500 mg by mouth at bedtime.    [provider]  methocarbamol (ROBAXIN) 500 MG tablet Take 1 tablet (500 mg total) by mouth every 6 (six) hours as needed for muscle spasms. 04/01/22   Eartha Inch, PA  metoCLOPramide (REGLAN) 5 MG tablet Take 5 mg by mouth every 6 (six) hours as needed for nausea.    [provider]  Multiple Vitamins-Minerals (ADULT GUMMY PO) Take 2 tablets by mouth daily.    [provider]  Multiple Vitamins-Minerals (AIRBORNE PO) Take 2 tablets by mouth daily.    [provider]  nitrofurantoin, macrocrystal-monohydrate, (MACROBID) 100 MG capsule Take 100 mg by mouth 2 (two) times daily. 08/01/22   [provider]  ondansetron (ZOFRAN-ODT) 8 MG disintegrating tablet Take 1 tablet (8 mg total) by mouth every 8 (eight) hours as needed for nausea or vomiting. 06/16/21   Lorre Nick, MD  [facility] ULTRA test strip 1 each by Other route 3 (three) times daily. 07/05/22   [provider]  Polyvinyl Alcohol-Povidone (REFRESH OP) Place 1 drop into both eyes in the morning and at bedtime.    [provider]  potassium gluconate 595 (99 K) MG TABS tablet Take  595 mg by mouth daily after breakfast.    [provider]  Probiotic Product (FORTIFY DAILY PROBIOTIC) CAPS Take 1 capsule by mouth daily.    [provider]  tirzepatide Greggory Keen) 5 MG/0.5ML Pen Inject 5 mg into the skin once a week. 11/20/21   [provider]  traMADol (ULTRAM) 50 MG tablet Take 1-2 tablets (50-100 mg total) by mouth every 6 (six) hours as needed. 04/01/22   Eartha Inch, PA  vitamin C (ASCORBIC ACID) 250 MG tablet Take 500 mg by mouth 2 (two) times daily.    [provider]  Allergies Penicillins, Cefdinir, Hydrocodone-acetaminophen, Oxycodone hcl, and Adhesive [tape]  Review of Systems Review of Systems As noted in HPI  Physical Exam Vital Signs  I have reviewed the triage vital signs BP 136/63 (BP Location: Right Arm)   Pulse 74   Temp 98.4 F (36.9 C) (Oral)   Resp 16   LMP  (LMP Unknown)   SpO2 100%   Physical Exam Vitals reviewed.  Constitutional:      General: She is not in acute distress.    Appearance: She is well-developed. She is not diaphoretic.  HENT:     Head: Normocephalic and atraumatic.     Right Ear: External ear normal.     Left Ear: External ear normal.     Nose: Nose normal.  Eyes:     General: No scleral icterus.    Conjunctiva/sclera: Conjunctivae normal.  Neck:     Trachea: Phonation normal.  Cardiovascular:     Rate and Rhythm: Normal rate and regular rhythm.     Comments: Up to mid thigh Pulmonary:     Effort: Pulmonary effort is normal. No respiratory distress.     Breath sounds: No stridor.  Abdominal:     General: There is no distension.  Musculoskeletal:        General: Normal range of motion.     Cervical back: Normal range of motion.     Right lower leg: 1+ Pitting Edema present.     Left lower leg: 1+ Pitting Edema present.  Neurological:      Mental Status: She is alert and oriented to person, place, and time.  Psychiatric:        Behavior: Behavior normal.     ED Results and Treatments Labs (all labs ordered are listed, but only abnormal results are displayed) Labs Reviewed - No data to display                                                                                                                       EKG  EKG Interpretation  Date/Time:    Ventricular Rate:    PR Interval:    QRS Duration:   QT Interval:    QTC Calculation:   R Axis:     Text Interpretation:         Radiology No results found.  Medications Ordered in ED Medications - No data to display Procedures Procedures  (including critical care time) Medical Decision Making / ED Course  Click here for ABCD2, HEART and other calculators  Medical Decision Making   ***    Final Clinical Impression(s) / ED Diagnoses Final diagnoses:  None    This chart was dictated using voice recognition software.  Despite best efforts to proofread,  errors can occur which can change the documentation meaning.

## 2022-08-07 ENCOUNTER — Other Ambulatory Visit (INDEPENDENT_AMBULATORY_CARE_PROVIDER_SITE_OTHER): Payer: Self-pay | Admitting: Family Medicine

## 2022-08-07 DIAGNOSIS — R600 Localized edema: Secondary | ICD-10-CM | POA: Diagnosis not present

## 2022-08-07 DIAGNOSIS — F411 Generalized anxiety disorder: Secondary | ICD-10-CM

## 2022-08-07 LAB — COMPREHENSIVE METABOLIC PANEL
ALT: 88 U/L — ABNORMAL HIGH (ref 0–44)
AST: 79 U/L — ABNORMAL HIGH (ref 15–41)
Albumin: 3.2 g/dL — ABNORMAL LOW (ref 3.5–5.0)
Alkaline Phosphatase: 240 U/L — ABNORMAL HIGH (ref 38–126)
Anion gap: 8 (ref 5–15)
BUN: 10 mg/dL (ref 8–23)
CO2: 25 mmol/L (ref 22–32)
Calcium: 9.3 mg/dL (ref 8.9–10.3)
Chloride: 104 mmol/L (ref 98–111)
Creatinine, Ser: 0.97 mg/dL (ref 0.44–1.00)
GFR, Estimated: >60 mL/min (ref >60)
Glucose, Bld: 146 mg/dL — ABNORMAL HIGH (ref 70–99)
Potassium: 4.1 mmol/L (ref 3.5–5.1)
Sodium: 137 mmol/L (ref 135–145)
Total Bilirubin: 0.6 mg/dL (ref 0.3–1.2)
Total Protein: 6.2 g/dL — ABNORMAL LOW (ref 6.5–8.1)

## 2022-08-07 LAB — BRAIN NATRIURETIC PEPTIDE: B Natriuretic Peptide: 96 pg/mL (ref 0.0–100.0)

## 2022-08-07 MED ORDER — CIPROFLOXACIN HCL 500 MG PO TABS
500.0000 mg | ORAL_TABLET | Freq: Once | ORAL | Status: AC
  Administered 2022-08-07: 500 mg via ORAL
  Filled 2022-08-07: qty 1

## 2022-08-07 MED ORDER — FUROSEMIDE 10 MG/ML IJ SOLN
20.0000 mg | Freq: Once | INTRAMUSCULAR | Status: AC
  Administered 2022-08-07: 20 mg via INTRAVENOUS
  Filled 2022-08-07: qty 4

## 2022-08-11 ENCOUNTER — Other Ambulatory Visit (INDEPENDENT_AMBULATORY_CARE_PROVIDER_SITE_OTHER): Payer: Self-pay | Admitting: Family Medicine

## 2022-08-11 MED ORDER — ESCITALOPRAM OXALATE 10 MG PO TABS: 10.0000 mg | ORAL_TABLET | Freq: Every day | ORAL | 0 refills | Status: DC

## 2022-08-12 ENCOUNTER — Encounter (INDEPENDENT_AMBULATORY_CARE_PROVIDER_SITE_OTHER): Payer: Self-pay | Admitting: Family Medicine

## 2022-08-12 ENCOUNTER — Telehealth (INDEPENDENT_AMBULATORY_CARE_PROVIDER_SITE_OTHER): Payer: Medicare Other | Admitting: Family Medicine

## 2022-08-12 DIAGNOSIS — Z6828 Body mass index (BMI) 28.0-28.9, adult: Secondary | ICD-10-CM | POA: Diagnosis not present

## 2022-08-12 DIAGNOSIS — E669 Obesity, unspecified: Secondary | ICD-10-CM

## 2022-08-12 DIAGNOSIS — E1069 Type 1 diabetes mellitus with other specified complication: Secondary | ICD-10-CM

## 2022-08-12 DIAGNOSIS — F411 Generalized anxiety disorder: Secondary | ICD-10-CM | POA: Diagnosis not present

## 2022-08-12 MED ORDER — ESCITALOPRAM OXALATE 10 MG PO TABS: 10.0000 mg | ORAL_TABLET | Freq: Every day | ORAL | 0 refills | Status: DC

## 2022-08-12 NOTE — Progress Notes (Signed)
TeleHealth Visit:  This visit was completed with telemedicine (audio/video) technology. Colleen Zuniga has verbally consented to this TeleHealth visit. The patient is located at home, the provider is located at home. The participants in this visit include the listed provider and patient. The visit was conducted today via MyChart video.  OBESITY Colleen Zuniga is here to discuss her progress with her obesity treatment plan along with follow-up of her obesity related diagnoses.   Today's visit was # 12 Starting weight: 190 lbs Starting date: 09/17/21 Weight at last in office visit: 155 lbs on 06/12/22 Total weight loss: 35 lbs at last in office visit on 06/12/22. Today's reported weight (08/12/22):  154  lbs  Goal weight 150 pounds.  Nutrition Plan: the Category 2 plan   Current exercise: walking   Interim History:  Husband has dementia which is a stressor for her.  She was recently in the hospital for 3 days for sepsis from pyelonephritis. Had UTI which didn't resolve with Macrobid. She has fluid weight on from hospitalization.  Reports she has "cankles". She was down to 148 lbs was before hospitalization. She is back to cat 2 plan now. She has restarted walking for exercise. Has pain in back when waking. Skips lunch sometimes.  Request 6-week follow-up since she is at goal weight.  Assessment/Plan:  1. Type 1 diabetes with other unspecified complication HgbA1c is at goal. Last A1c was 5.9 on 03/20/2022.  She does an excellent job with self-management.  On NovoLog per insulin pump. She was diagnosed with type 1 diabetes at the age of 53.  Blood  sugars well controlled but were very high in the hospital.  CBGs: 46-150.  Has been off of Mounjaro mg for a over a week due to the hospitalization but is now restarting. No side effects. PCP manages. Also sees CDE.   Lab Results  Component Value Date   HGBA1C 5.9 (H) 03/20/2022   HGBA1C 6.3 (H) 09/17/2021   HGBA1C 6.8 (H) 07/28/2019   Lab  Results  Component Value Date   LDLCALC 67 04/29/2018   CREATININE 0.97 08/06/2022   No results found for: "GFR"  Plan: Continue Mounjaro and NovoLog per pump.  2.  Generalized anxiety disorder On Lexapro 10 mg at bedtime.  Has been without it for few days and has noticed a difference.  Plan: Refill Lexapro 10 mg daily.  3. Generalized Obesity: Current BMI 28  [place]ysue is currently in the action stage of change. As such, her goal is to continue with weight loss efforts.  She has agreed to the Category 2 plan.  Exercise goals: add arm resistance training   Behavioral modification strategies: planning for success.  Jancie has agreed to follow-up with our clinic in 6 weeks.  No orders of the defined types were placed in this encounter.   Medications Discontinued During This Encounter  Medication Reason   levothyroxine (SYNTHROID) 88 MCG tablet    escitalopram (LEXAPRO) 10 MG tablet Reorder     Meds ordered this encounter  Medications   escitalopram (LEXAPRO) 10 MG tablet    Sig: Take 1 tablet (10 mg total) by mouth at bedtime.    Dispense:  90 tablet    Refill:  0    Order Specific Question:   Supervising Provider    Answer:   Glennis Brink [[id number]]      Objective:   VITALS: Per patient if applicable, see vitals. GENERAL: Alert and in no acute distress. CARDIOPULMONARY: No increased WOB. Speaking in clear  sentences.  PSYCH: Pleasant and cooperative. Speech normal rate and rhythm. Affect is appropriate. Insight and judgement are appropriate. Attention is focused, linear, and appropriate.  NEURO: Oriented as arrived to appointment on time with no prompting.   Attestation Statements:   Reviewed by clinician on day of visit: allergies, medications, problem list, medical history, surgical history, family history, social history, and previous encounter notes.   This was prepared with the assistance of Engineer, civil (consulting).  Occasional wrong-word or sound-a-like  substitutions may have occurred due to the inherent limitations of voice recognition software.

## 2022-09-23 ENCOUNTER — Encounter (INDEPENDENT_AMBULATORY_CARE_PROVIDER_SITE_OTHER): Payer: Self-pay | Admitting: Family Medicine

## 2022-09-23 ENCOUNTER — Ambulatory Visit (INDEPENDENT_AMBULATORY_CARE_PROVIDER_SITE_OTHER): Payer: Medicare Other | Admitting: Family Medicine

## 2022-09-23 VITALS — BP 132/65 | HR 70 | Temp 98.1°F | Ht 62.0 in | Wt 140.0 lb

## 2022-09-23 DIAGNOSIS — Z636 Dependent relative needing care at home: Secondary | ICD-10-CM

## 2022-09-23 DIAGNOSIS — E669 Obesity, unspecified: Secondary | ICD-10-CM

## 2022-09-23 DIAGNOSIS — E1069 Type 1 diabetes mellitus with other specified complication: Secondary | ICD-10-CM | POA: Diagnosis not present

## 2022-09-23 DIAGNOSIS — M170 Bilateral primary osteoarthritis of knee: Secondary | ICD-10-CM | POA: Diagnosis not present

## 2022-09-23 DIAGNOSIS — Z6825 Body mass index (BMI) 25.0-25.9, adult: Secondary | ICD-10-CM

## 2022-09-23 MED ORDER — ESCITALOPRAM OXALATE 10 MG PO TABS
10.0000 mg | ORAL_TABLET | Freq: Every day | ORAL | 0 refills | Status: DC
Start: 2022-09-23 — End: 2022-12-22

## 2022-09-23 NOTE — Assessment & Plan Note (Signed)
Stable.  Patient has a good support system and is still dealing with caregiver stress related to her husband who has dementia.  She has seen benefit using Lexapro 10 mg once daily to help with mood changes due to caregiver stress.  She denies adverse side effects from Lexapro.  She is sleeping better at night.  Continue to work on stress reduction, mindful eating and scheduling time for self-care.  Continue Lexapro 10 mg once daily

## 2022-09-23 NOTE — Progress Notes (Signed)
Office: [phone]  /  Fax: [phone]  WEIGHT SUMMARY AND BIOMETRICS  Starting Date: 09/17/21  Starting Weight: 190lb   Weight Lost Since Last Visit: 15lb   Vitals Temp: 98.1 F (36.7 C) BP: 132/65 Pulse Rate: 70 SpO2: 99 %   Body Composition  Body Fat %: 39.4 % Fat Mass (lbs): 55.4 lbs Muscle Mass (lbs): 81 lbs Total Body Water (lbs): 70.6 lbs Visceral Fat Rating : 9    HPI  Chief Complaint: OBESITY  Colleen Zuniga is here to discuss her progress with her obesity treatment plan. She is on the the Category 2 Plan and states she is following her eating plan approximately 85 % of the time. She states she is exercising 30 minutes 7 times per week.   Interval History:  Since last office visit she is down 15 lb in the past 3 months She has lost 2.8 pounds of muscle mass and lost 12 pounds of body fat in the past 3 months She has a net weight loss of 50 pounds in the past 1 year of medically supervised weight management This is a 26.3% total body weight loss in 1 year 28% of her total body weight loss was muscle mass She was hospitalized for urosepsis 5/25 She reports an A1c of 5.3 She had normal renal function during hospitalization She is walking at night 30 min daily She still has occasional low blood sugar readings both at night and post exercise She is happy at her current weight and would like to maintain here  Pharmacotherapy: Mounjaro 5 mg once weekly injection per endocrinology  PHYSICAL EXAM:  Blood pressure 132/65, pulse 70, temperature 98.1 F (36.7 C), height 5\' 2"  (1.575 m), weight 140 lb (63.5 kg), SpO2 99%. Body mass index is 25.61 kg/m.  General: She is healthy appearing, cooperative, alert, well developed, and in no acute distress. PSYCH: Has normal mood, affect and thought process.   Lungs: Normal breathing effort, no conversational dyspnea.   ASSESSMENT AND PLAN  TREATMENT PLAN FOR OBESITY:  Recommended Dietary Goals  Jalaine is currently  in the action stage of change. As such, her goal is to continue weight management plan. She has agreed to keeping a food journal and adhering to recommended goals of 1600 calories and 75 g protein.  Behavioral Intervention  We discussed the following Behavioral Modification Strategies today: increasing vegetables, increasing lower glycemic fruits, increasing water intake, work on tracking and journaling calories using tracking application, keeping healthy foods at home, continue to practice mindfulness when eating, planning for success, and better snacking choices.  Additional resources provided today: NA  Recommended Physical Activity Goals  Shellene has been advised to work up to 150 minutes of moderate intensity aerobic activity a week and strengthening exercises 2-3 times per week for cardiovascular health, weight loss maintenance and preservation of muscle mass.   She has agreed to Think about ways to increase daily physical activity and overcoming barriers to exercise  Pharmacotherapy changes for the treatment of obesity: None  ASSOCIATED CONDITIONS ADDRESSED TODAY  Caregiver stress Assessment & Plan: Stable.  Patient has a good support system and is still dealing with caregiver stress related to her husband who has dementia.  She has seen benefit using Lexapro 10 mg once daily to help with mood changes due to caregiver stress.  She denies adverse side effects from Lexapro.  She is sleeping better at night.  Continue to work on stress reduction, mindful eating and scheduling time for self-care.  Continue Lexapro  10 mg once daily  Orders: -     Escitalopram Oxalate; Take 1 tablet (10 mg total) by mouth at bedtime.  Dispense: 90 tablet; Refill: 0  Generalized obesity: Starting BMI 34  BMI 25.0-25.9,adult  Type 1 diabetes mellitus with other specified complication [facility]) Assessment & Plan: Patient has been back to see her diabetic specialist with adjustments made to her insulin pump.   She is still having some occasional lows post exercise.  She avoids meal skipping and is conscious of her food choices.  She has seen improvements in her blood sugar readings with the addition of Mounjaro per endocrinology.  Her most recent A1c is not available for review, drawn at [facility].  Continue working on current dietary changes.  Did recommend adding in a small snack preexercise to include 1 healthy fat +1 carbohydrate such as one half a banana with peanut butter or one half an English muffin with peanut butter.   Primary osteoarthritis of both knees Assessment & Plan: Patient has successfully had both knee replacements done.  Her recovery with weight loss has been great and she has good range of motion.  She no longer has pain with ambulation.  She has been able to increase her walking time to 30 minutes daily.  She is doing some home exercises as well to help with her mobility and strength.  Continue working on regular walking and home exercises.  Congratulated her on her success.       She was informed of the importance of frequent follow up visits to maximize her success with intensive lifestyle modifications for her multiple health conditions.   ATTESTASTION STATEMENTS:  Reviewed by clinician on day of visit: allergies, medications, problem list, medical history, surgical history, family history, social history, and previous encounter notes pertinent to obesity diagnosis.   I have personally spent 30 minutes total time today in preparation, patient care, nutritional counseling and documentation for this visit, including the following: review of clinical lab tests; review of medical tests/procedures/services.      Glennis Brink, DO DABFM, DABOM [facility] [address] [place], [place] [address] [phone]

## 2022-09-23 NOTE — Assessment & Plan Note (Signed)
Patient has been back to see her diabetic specialist with adjustments made to her insulin pump.  She is still having some occasional lows post exercise.  She avoids meal skipping and is conscious of her food choices.  She has seen improvements in her blood sugar readings with the addition of Mounjaro per endocrinology.  Her most recent A1c is not available for review, drawn at [facility].  Continue working on current dietary changes.  Did recommend adding in a small snack preexercise to include 1 healthy fat +1 carbohydrate such as one half a banana with peanut butter or one half an English muffin with peanut butter.

## 2022-09-23 NOTE — Assessment & Plan Note (Signed)
Patient has successfully had both knee replacements done.  Her recovery with weight loss has been great and she has good range of motion.  She no longer has pain with ambulation.  She has been able to increase her walking time to 30 minutes daily.  She is doing some home exercises as well to help with her mobility and strength.  Continue working on regular walking and home exercises.  Congratulated her on her success.

## 2022-10-07 ENCOUNTER — Other Ambulatory Visit (INDEPENDENT_AMBULATORY_CARE_PROVIDER_SITE_OTHER): Payer: Self-pay | Admitting: Family Medicine

## 2022-10-07 DIAGNOSIS — Z636 Dependent relative needing care at home: Secondary | ICD-10-CM

## 2022-10-07 DIAGNOSIS — F411 Generalized anxiety disorder: Secondary | ICD-10-CM

## 2022-10-20 ENCOUNTER — Other Ambulatory Visit (INDEPENDENT_AMBULATORY_CARE_PROVIDER_SITE_OTHER): Payer: Self-pay | Admitting: Family Medicine

## 2022-10-20 DIAGNOSIS — F411 Generalized anxiety disorder: Secondary | ICD-10-CM

## 2022-12-10 ENCOUNTER — Other Ambulatory Visit: Payer: Self-pay | Admitting: Neurosurgery

## 2022-12-10 DIAGNOSIS — M48062 Spinal stenosis, lumbar region with neurogenic claudication: Secondary | ICD-10-CM

## 2022-12-10 DIAGNOSIS — M542 Cervicalgia: Secondary | ICD-10-CM

## 2022-12-22 ENCOUNTER — Ambulatory Visit (INDEPENDENT_AMBULATORY_CARE_PROVIDER_SITE_OTHER): Payer: Medicare Other | Admitting: Internal Medicine

## 2022-12-22 ENCOUNTER — Encounter (INDEPENDENT_AMBULATORY_CARE_PROVIDER_SITE_OTHER): Payer: Self-pay | Admitting: Internal Medicine

## 2022-12-22 VITALS — BP 134/74 | HR 68 | Temp 98.0°F | Ht 62.0 in | Wt 135.0 lb

## 2022-12-22 DIAGNOSIS — Z6823 Body mass index (BMI) 23.0-23.9, adult: Secondary | ICD-10-CM | POA: Insufficient documentation

## 2022-12-22 DIAGNOSIS — E1069 Type 1 diabetes mellitus with other specified complication: Secondary | ICD-10-CM

## 2022-12-22 DIAGNOSIS — Z7985 Long-term (current) use of injectable non-insulin antidiabetic drugs: Secondary | ICD-10-CM

## 2022-12-22 DIAGNOSIS — Z6824 Body mass index (BMI) 24.0-24.9, adult: Secondary | ICD-10-CM | POA: Insufficient documentation

## 2022-12-22 DIAGNOSIS — E669 Obesity, unspecified: Secondary | ICD-10-CM

## 2022-12-22 DIAGNOSIS — Z636 Dependent relative needing care at home: Secondary | ICD-10-CM

## 2022-12-22 MED ORDER — ESCITALOPRAM OXALATE 10 MG PO TABS: 10.0000 mg | ORAL_TABLET | Freq: Every day | ORAL | 0 refills | Status: DC

## 2022-12-22 NOTE — Progress Notes (Signed)
Office: [phone]  /  Fax: [phone]  WEIGHT SUMMARY AND BIOMETRICS  Vitals Temp: 98 F (36.7 C) BP: 134/74 Pulse Rate: 68 SpO2: 100 %   Anthropometric Measurements Height: 5\' 2"  (1.575 m) Weight: 135 lb (61.2 kg) BMI (Calculated): 24.69 Weight at Last Visit: 140 lb Weight Lost Since Last Visit: 5 lb Weight Gained Since Last Visit: 0 Starting Weight: 190 lb Total Weight Loss (lbs): 55 lb (24.9 kg)   Body Composition  Body Fat %: 38 % Fat Mass (lbs): 51.6 lbs Muscle Mass (lbs): 79.8 lbs Total Body Water (lbs): 0 lbs Visceral Fat Rating : 9    No data recorded Today's Visit #: 12  Starting Date: 09/17/21   HPI  Chief Complaint: OBESITY  Colleen Zuniga is here to discuss her progress with her obesity treatment plan. She is on the the Category 2 Plan and states she is following her eating plan approximately 85 % of the time. She states she is exercising 30 minutes 7 times per week.  Interval History:  Discussed the use of AI scribe software for clinical note transcription with the patient, who gave verbal consent to proceed.  History of Present Illness   The patient, a long-standing type 1 diabetic of 55 years, presents for a follow-up on obesity. She has been successful in her weight loss journey, losing approximately 30% of her body weight, with a current BMI of 24. She has been on incretin therapy for about eight months and reports a recent HbA1c of 5.6. She manages her diabetes with an insulin pump and a Dexcom G6.  The patient reports being "brittle" with her diabetes, experiencing fluctuations in blood glucose levels due to exercise and stress. She notes a pattern of rebound hyperglycemia following hypoglycemic episodes. She also reports skipping meals frequently, particularly lunch, due to caregiving responsibilities for her husband with dementia and her 58 year old mother. When she does eat, she aims for a protein-rich diet.  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 She also does some strength exercises at home with hand weights and stretching for balance and core strength. Despite her weight loss, she has lost about 30% of her muscle mass, which is a concern.  The patient has been maintaining her current weight for the past few weeks, fluctuating between 134 and 137 pounds. She expresses satisfaction with her current weight and has no desire to lose more. She is keen to focus on maintaining her weight and improving her muscle mass.        Barriers identified: moderate to high levels of stress.   Pharmacotherapy for weight loss: She is currently taking Monjauro with diabetes as the primary indication with adequate clinical response  and without side effects..    ASSESSMENT AND PLAN  TREATMENT PLAN FOR OBESITY:  Recommended Dietary Goals  Colleen Zuniga is currently in the action stage of change. As such, her goal is to continue weight management plan. She has agreed to: switch to increase calories by 100 to 200/day by adding healthy fats with strategy to maintain current weight  Behavioral Intervention  We discussed the following Behavioral Modification Strategies today: increasing lean protein intake to established goals and decreasing simple carbohydrates .  Additional resources provided today: None  Recommended Physical Activity Goals  Colleen Zuniga has been advised to work up to 150 minutes of moderate intensity aerobic activity a week and strengthening exercises 2-3 times per  week for cardiovascular health, weight loss maintenance and preservation of muscle mass.   She has agreed to :  Start strengthening exercises with a goal of 2-3 sessions a week   Pharmacotherapy We discussed various medication options to help Colleen Zuniga with her weight loss efforts and we both agreed to : continue current anti-obesity medication regimen  ASSOCIATED  CONDITIONS ADDRESSED TODAY  Type 1 diabetes mellitus with other specified complication [facility]) Assessment & Plan: She reports her most recent A1c was 5.7, she is on insulin pump and continuous monitoring.  She is on Mounjaro 5 mg once a week which has helped with orixegenic signaling and glycemic control.  We discussed distributing her carbohydrates throughout the day to reduce excursions.  She will continue on current regimen.   Caregiver stress Assessment & Plan: Due to husband who has dementia.  She is also caring for her 67 year old mother.  She is currently on Lexapro 10 mg once daily without any adverse effects and has benefited from medication.  She will continue medication  Orders: -     Escitalopram Oxalate; Take 1 tablet (10 mg total) by mouth at bedtime.  Dispense: 90 tablet; Refill: 0  Generalized obesity: Starting BMI 34 Assessment & Plan: Patient has lost approximately 55 pounds which represents 29% of total body weight.  She has lost 30% and muscle which is higher than recommended.  I would like for her to increase her calories by about 100-200 by incorporating healthy fats into her diet.  She is also to avoid skipping meals.  She is to increase protein intake to 30 g per meal 3 times a day, distribute carbohydrates equally.  She will also start strengthening exercises for muscle mass preservation.  Her goal is to maintain current weight.  She is very happy at her weight.   BMI 24.0-24.9, adult    PHYSICAL EXAM:  Blood pressure 134/74, pulse 68, temperature 98 F (36.7 C), height 5\' 2"  (1.575 m), weight 135 lb (61.2 kg), SpO2 100%. Body mass index is 24.69 kg/m.  General: She is overweight, cooperative, alert, well developed, and in no acute distress. PSYCH: Has normal mood, affect and thought process.   HEENT: EOMI, sclerae are anicteric. Lungs: Normal breathing effort, no conversational dyspnea. Extremities: No edema.  Neurologic: No gross sensory or motor deficits.  No tremors or fasciculations noted.    DIAGNOSTIC DATA REVIEWED:  BMET    Component Value Date/Time   NA 137 08/06/2022 2346   NA 140 09/17/2021 1541   K 4.1 08/06/2022 2346   CL 104 08/06/2022 2346   CO2 25 08/06/2022 2346   GLUCOSE 146 (H) 08/06/2022 2346   BUN 10 08/06/2022 2346   BUN 20 09/17/2021 1541   CREATININE 0.97 08/06/2022 2346   CALCIUM 9.3 08/06/2022 2346   GFRNONAA >60 08/06/2022 2346   GFRAA >60 08/08/2019 0920   Lab Results  Component Value Date   HGBA1C 5.9 (H) 03/20/2022   HGBA1C 6.1 (H) 08/11/2016   Lab Results  Component Value Date   INSULIN <0.4 (L) 09/17/2021   Lab Results  Component Value Date   TSH 0.049 (L) 09/17/2021   CBC    Component Value Date/Time   WBC 5.3 08/06/2022 2346   RBC 2.78 (L) 08/06/2022 2346   HGB 8.9 (L) 08/06/2022 2346   HGB 10.7 (L) 01/18/2019 0812   HCT 27.1 (L) 08/06/2022 2346   HCT 33.1 (L) 01/18/2019 0812   PLT 188 08/06/2022 2346   PLT 217 01/18/2019 0981  MCV 97.5 08/06/2022 2346   MCV 92 01/18/2019 0812   MCH 32.0 08/06/2022 2346   MCHC 32.8 08/06/2022 2346   RDW 12.0 08/06/2022 2346   RDW 12.6 01/18/2019 0812   Iron Studies    Component Value Date/Time   IRON 37 01/18/2019 0812   TIBC 233 (L) 01/18/2019 0812   FERRITIN 21 01/18/2019 0812   IRONPCTSAT 16 01/18/2019 0812   Lipid Panel     Component Value Date/Time   CHOL 156 04/29/2018 1354   TRIG 76 04/29/2018 1354   HDL 74 04/29/2018 1354   LDLCALC 67 04/29/2018 1354   Hepatic Function Panel     Component Value Date/Time   PROT 6.2 (L) 08/06/2022 2346   PROT 6.7 09/17/2021 1541   ALBUMIN 3.2 (L) 08/06/2022 2346   ALBUMIN 4.5 09/17/2021 1541   AST 79 (H) 08/06/2022 2346   ALT 88 (H) 08/06/2022 2346   ALKPHOS 240 (H) 08/06/2022 2346   BILITOT 0.6 08/06/2022 2346   BILITOT 0.5 09/17/2021 1541      Component Value Date/Time   TSH 0.049 (L) 09/17/2021 1541   Nutritional Lab Results  Component Value Date   VD25OH 70.8 09/17/2021    VD25OH 41.3 04/29/2018     Return in about 4 weeks (around 01/19/2023) for For Weight Mangement with Dr. Rikki Spearing.Marland Kitchen She was informed of the importance of frequent follow up visits to maximize her success with intensive lifestyle modifications for her multiple health conditions.   ATTESTASTION STATEMENTS:  Reviewed by clinician on day of visit: allergies, medications, problem list, medical history, surgical history, family history, social history, and previous encounter notes.     Worthy Rancher, MD

## 2022-12-22 NOTE — Assessment & Plan Note (Signed)
Due to husband who has dementia.  She is also caring for her 64 year old mother.  She is currently on Lexapro 10 mg once daily without any adverse effects and has benefited from medication.  She will continue medication

## 2022-12-22 NOTE — Assessment & Plan Note (Signed)
She reports her most recent A1c was 5.7, she is on insulin pump and continuous monitoring.  She is on Mounjaro 5 mg once a week which has helped with orixegenic signaling and glycemic control.  We discussed distributing her carbohydrates throughout the day to reduce excursions.  She will continue on current regimen.

## 2022-12-22 NOTE — Assessment & Plan Note (Signed)
Patient has lost approximately 55 pounds which represents 29% of total body weight.  She has lost 30% and muscle which is higher than recommended.  I would like for her to increase her calories by about 100-200 by incorporating healthy fats into her diet.  She is also to avoid skipping meals.  She is to increase protein intake to 30 g per meal 3 times a day, distribute carbohydrates equally.  She will also start strengthening exercises for muscle mass preservation.  Her goal is to maintain current weight.  She is very happy at her weight.

## 2023-01-09 ENCOUNTER — Ambulatory Visit
Admission: RE | Admit: 2023-01-09 | Discharge: 2023-01-09 | Disposition: A | Discharge status: Home / Self Care | Payer: Medicare Other | Source: Ambulatory Visit | Attending: Neurosurgery | Admitting: Neurosurgery

## 2023-01-09 DIAGNOSIS — M48062 Spinal stenosis, lumbar region with neurogenic claudication: Secondary | ICD-10-CM

## 2023-01-09 DIAGNOSIS — M542 Cervicalgia: Secondary | ICD-10-CM

## 2023-01-19 ENCOUNTER — Ambulatory Visit (INDEPENDENT_AMBULATORY_CARE_PROVIDER_SITE_OTHER): Payer: Medicare Other | Admitting: Internal Medicine

## 2023-01-19 ENCOUNTER — Encounter (INDEPENDENT_AMBULATORY_CARE_PROVIDER_SITE_OTHER): Payer: Self-pay | Admitting: Internal Medicine

## 2023-01-19 VITALS — BP 106/69 | HR 69 | Temp 98.2°F | Ht 62.0 in | Wt 133.0 lb

## 2023-01-19 DIAGNOSIS — E669 Obesity, unspecified: Secondary | ICD-10-CM

## 2023-01-19 DIAGNOSIS — E1069 Type 1 diabetes mellitus with other specified complication: Secondary | ICD-10-CM

## 2023-01-19 DIAGNOSIS — Z7985 Long-term (current) use of injectable non-insulin antidiabetic drugs: Secondary | ICD-10-CM

## 2023-01-19 DIAGNOSIS — D649 Anemia, unspecified: Secondary | ICD-10-CM

## 2023-01-19 DIAGNOSIS — E10649 Type 1 diabetes mellitus with hypoglycemia without coma: Secondary | ICD-10-CM | POA: Diagnosis not present

## 2023-01-19 DIAGNOSIS — Z6824 Body mass index (BMI) 24.0-24.9, adult: Secondary | ICD-10-CM

## 2023-01-19 DIAGNOSIS — R7401 Elevation of levels of liver transaminase levels: Secondary | ICD-10-CM | POA: Diagnosis not present

## 2023-01-19 DIAGNOSIS — R7989 Other specified abnormal findings of blood chemistry: Secondary | ICD-10-CM

## 2023-01-19 NOTE — Progress Notes (Signed)
Office: [phone]  /  Fax: [phone]  Weight Summary And Biometrics  Vitals Temp: 98.2 F (36.8 C) BP: 106/69 Pulse Rate: 69 SpO2: 99 %   Anthropometric Measurements Height: 5\' 2"  (1.575 m) Weight: 133 lb (60.3 kg) BMI (Calculated): 24.32 Weight at Last Visit: 135 lb Weight Lost Since Last Visit: 3 lb Weight Gained Since Last Visit: 0 lb Starting Weight: 190 lb Total Weight Loss (lbs): 58 lb (26.3 kg)   Body Composition  Body Fat %: 36.6 % Fat Mass (lbs): 48.6 lbs Muscle Mass (lbs): 79.8 lbs Total Body Water (lbs): 69.4 lbs Visceral Fat Rating : 8    No data recorded Today's Visit #: 13  Starting Date: 09/17/21   Subjective   Chief Complaint: Obesity  Colleen Zuniga is here to discuss her progress with her obesity treatment plan. She is on the the Category 2 Plan and states she is following her eating plan approximately 85-90 % of the time. She states she is exercising 30 minutes 7 times per week.  Interval History:   Discussed the use of AI scribe software for clinical note transcription with the patient, who gave verbal consent to proceed.  History of Present Illness   The patient, a 62 year old individual with a history of type 1 diabetes and obesity, presents for weight management. Since the last visit, the patient has lost three pounds, adhering to a reduced-calorie nutrition plan and exercising for 30 minutes daily. Despite the progress, the patient reports ongoing stress due to caring for family members with dementia.  The patient has made significant strides in weight loss, reducing body fat percentage from 47% to 36% since July of the previous year. The patient's goal is to reduce this further to under 36%. The weight loss has been primarily fat, with muscle mass preserved.  The patient has a history of degenerative disc disease and scoliosis, for which she takes gabapentin. She reports occasional swelling in her legs, a known side effect of the  medication. The patient also takes tirzepatide 5mg  for diabetes management, which has resulted in a decrease in insulin requirement and improved postprandial blood sugars.  The patient has had a few instances of extreme hypoglycemia, which she attributes to stress and fatigue from caregiving responsibilities. She expresses concern about sleeping through alarms for low blood sugars and is considering getting a guide dog trained to alert to blood sugar changes.  The patient also reports a history of anemia, with a hemoglobin level of 8.9 (normal is above 12). She takes over-the-counter iron supplements as recommended by her primary care physician. The patient also has elevated liver enzymes, which were discovered during a previous hospitalization for a sepsis kidney infection. The cause of these elevated enzymes is currently unknown.  The patient has undergone significant weight loss, from a peak weight of 201 lbs to the current weight of 133 lbs, representing a loss of approximately 30% of her total body weight. This weight loss journey was supported by a program called [employer], recommended by her orthopedic doctor due to knee replacement surgeries.  The patient's diabetes management includes the use of an insulin pump, which she is adjusting to prevent hypoglycemic episodes during exercise. The patient's last recorded HbA1c was 5.9, indicating tight blood sugar control. However, the patient reports frequent low blood sugars, particularly in relation to exercise.       Orexigenic Control:  Denies problems with appetite and hunger signals.  Denies problems with satiety and satiation.  Denies problems with eating  patterns and portion control.  Denies abnormal cravings. Denies feeling deprived or restricted.   Barriers identified: Caregiver stress, comorbid conditions and presence of obesogenic medications.  Pharmacotherapy for weight loss: She is currently taking Monjauro with diabetes as the  primary indication with adequate clinical response  and without side effects..   Assessment and Plan   Treatment Plan For Obesity:  Recommended Dietary Goals  Dystiny is currently in the action stage of change. As such, her goal is to continue weight management plan. She has agreed to: continue current plan  Behavioral Intervention  We discussed the following Behavioral Modification Strategies today: continue to work on maintaining a reduced calorie state, getting the recommended amount of protein, incorporating whole foods, making healthy choices, staying well hydrated and practicing mindfulness when eating..  Additional resources provided today: None  Recommended Physical Activity Goals  Kamille has been advised to work up to 150 minutes of moderate intensity aerobic activity a week and strengthening exercises 2-3 times per week for cardiovascular health, weight loss maintenance and preservation of muscle mass.   She has agreed to :  continue to gradually increase the amount and intensity of exercise   Pharmacotherapy  We discussed various medication options to help Nyaira with her weight loss efforts and we both agreed to : continue with nutritional and behavioral strategies  Associated Conditions Addressed Today  Assessment and Plan    Obesity   She presents for weight management, having lost three pounds since the last visit. She adheres to a reduced calorie nutrition plan and exercises for 30 minutes seven times per week, resulting in a decrease in body fat percentage from 47% to 36% since July of last year, with a goal to get under 36%. We will continue the reduced calorie nutrition plan and exercising 30 minutes seven times per week. We encourage the use of weights and core exercises to preserve muscle mass.  She is also doing a good job with her protein intake.  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 Her diabetes is well-controlled with an A1c of 5.9% as of  January, on tirzepatide 5 mg, which has decreased insulin requirements and improved postprandial blood sugars. She experiences frequent hypoglycemia, especially during exercise, and has a degree of hypoglycemia unawareness.  She reports sleeping through her low blood sugar alerts.  We discussed the risks of hypoglycemia including syncope, seizures, encephalopathy and falls, and emphasized the importance of less stringent blood sugar control to address hypoglycemia unawareness.  She will discuss her exercise regimen and need for insulin adjustments with managing provider, monitor blood sugars closely, and consider less stringent blood sugar control.  This may also result in compensatory eating.  She should eat a small meal if blood sugars are less than 110 before bedtime and before exercising.  She was also advised to avoid exercising at the peak of her insulin.  Anemia   She has anemia with a hemoglobin level of 8.9 and is currently taking over-the-counter iron supplements.  Because of age the cause of the anemia needs further investigation. We discussed potential causes.  She is scheduled to see her primary care doctor in December I have asked her to review this further with him.  She reports having a family history of hemolytic anemia.  Elevated Liver Enzymes   She had elevated liver enzymes earlier this year likely due to an acute illness.  This may be the cause of her elevated liver enzymes.  She has had negative hepatitis serologies in the  past.  I reviewed previous abdominal imaging and she has a small liver hemangioma without evidence of hepatic steatosis.  We discussed potential medication-related causes and the need for follow-up testing.  She is having blood work done in December by her primary care if her liver enzymes remain elevated I recommend further workup.  We should also check a GTT and PT and INR with neck set of labs.  Degenerative Disc Disease and Scoliosis   She has severe degenerative  disc disease and scoliosis, managed with gabapentin for pain. She is awaiting MRI results for further evaluation.  We discussed on how gabapentin may contribute to weight gain.  She needs medication for quality of life.  General Health Maintenance   She is on preventive statin (Lipitor 10 mg) and has regular eye and foot exams. She is scheduled for a physical in December. We emphasized the importance of fasting for upcoming blood work and following up on iron levels and liver enzymes. We will ensure fasting for upcoming blood work, follow up on iron levels and liver enzymes during the physical, and continue preventive statin therapy.  Follow-up   We will follow up with her primary care physician in December for a physical and blood work and discuss her exercise regimen and insulin adjustments with a certified diabetes  educator.       Objective   Physical Exam:  Blood pressure 106/69, pulse 69, temperature 98.2 F (36.8 C), height 5\' 2"  (1.575 m), weight 133 lb (60.3 kg), SpO2 99%. Body mass index is 24.33 kg/m.  General: She is overweight, cooperative, alert, well developed, and in no acute distress. PSYCH: Has normal mood, affect and thought process.   HEENT: EOMI, sclerae are anicteric. Lungs: Normal breathing effort, no conversational dyspnea. Extremities: No edema.  Neurologic: No gross sensory or motor deficits. No tremors or fasciculations noted.    Diagnostic Data Reviewed:  BMET    Component Value Date/Time   NA 137 08/06/2022 2346   NA 140 09/17/2021 1541   K 4.1 08/06/2022 2346   CL 104 08/06/2022 2346   CO2 25 08/06/2022 2346   GLUCOSE 146 (H) 08/06/2022 2346   BUN 10 08/06/2022 2346   BUN 20 09/17/2021 1541   CREATININE 0.97 08/06/2022 2346   CALCIUM 9.3 08/06/2022 2346   GFRNONAA >60 08/06/2022 2346   GFRAA >60 08/08/2019 0920   Lab Results  Component Value Date   HGBA1C 5.9 (H) 03/20/2022   HGBA1C 6.1 (H) 08/11/2016   Lab Results  Component Value Date    INSULIN <0.4 (L) 09/17/2021   Lab Results  Component Value Date   TSH 0.049 (L) 09/17/2021   CBC    Component Value Date/Time   WBC 5.3 08/06/2022 2346   RBC 2.78 (L) 08/06/2022 2346   HGB 8.9 (L) 08/06/2022 2346   HGB 10.7 (L) 01/18/2019 0812   HCT 27.1 (L) 08/06/2022 2346   HCT 33.1 (L) 01/18/2019 0812   PLT 188 08/06/2022 2346   PLT 217 01/18/2019 0812   MCV 97.5 08/06/2022 2346   MCV 92 01/18/2019 0812   MCH 32.0 08/06/2022 2346   MCHC 32.8 08/06/2022 2346   RDW 12.0 08/06/2022 2346   RDW 12.6 01/18/2019 0812   Iron Studies    Component Value Date/Time   IRON 37 01/18/2019 0812   TIBC 233 (L) 01/18/2019 0812   FERRITIN 21 01/18/2019 0812   IRONPCTSAT 16 01/18/2019 0812   Lipid Panel     Component Value Date/Time  CHOL 156 04/29/2018 1354   TRIG 76 04/29/2018 1354   HDL 74 04/29/2018 1354   LDLCALC 67 04/29/2018 1354   Hepatic Function Panel     Component Value Date/Time   PROT 6.2 (L) 08/06/2022 2346   PROT 6.7 09/17/2021 1541   ALBUMIN 3.2 (L) 08/06/2022 2346   ALBUMIN 4.5 09/17/2021 1541   AST 79 (H) 08/06/2022 2346   ALT 88 (H) 08/06/2022 2346   ALKPHOS 240 (H) 08/06/2022 2346   BILITOT 0.6 08/06/2022 2346   BILITOT 0.5 09/17/2021 1541      Component Value Date/Time   TSH 0.049 (L) 09/17/2021 1541   Nutritional Lab Results  Component Value Date   VD25OH 70.8 09/17/2021   VD25OH 41.3 04/29/2018    Follow-Up   Return in about 4 weeks (around 02/16/2023).Marland Kitchen She was informed of the importance of frequent follow up visits to maximize her success with intensive lifestyle modifications for her multiple health conditions.  Attestation Statement   Reviewed by clinician on day of visit: allergies, medications, problem list, medical history, surgical history, family history, social history, and previous encounter notes.   I have spent 40 minutes in the care of the patient today including: preparing to see patient (e.g. review and interpretation  of tests, old notes ), obtaining and/or reviewing separately obtained history, performing a medically appropriate examination or evaluation, counseling and educating the patient, documenting clinical information in the electronic or other health care record, and independently interpreting results and communicating results to the patient, family, or caregiver   Worthy Rancher, MD

## 2023-01-29 ENCOUNTER — Other Ambulatory Visit: Payer: Self-pay | Admitting: Neurosurgery

## 2023-01-29 DIAGNOSIS — M5440 Lumbago with sciatica, unspecified side: Secondary | ICD-10-CM

## 2023-02-16 ENCOUNTER — Ambulatory Visit
Admission: RE | Admit: 2023-02-16 | Discharge: 2023-02-16 | Disposition: A | Discharge status: Home / Self Care | Payer: Medicare Other | Source: Ambulatory Visit | Attending: Neurosurgery | Admitting: Neurosurgery

## 2023-02-16 ENCOUNTER — Ambulatory Visit (INDEPENDENT_AMBULATORY_CARE_PROVIDER_SITE_OTHER): Payer: Medicare Other | Admitting: Internal Medicine

## 2023-02-16 ENCOUNTER — Encounter (INDEPENDENT_AMBULATORY_CARE_PROVIDER_SITE_OTHER): Payer: Self-pay | Admitting: Internal Medicine

## 2023-02-16 VITALS — BP 136/80 | HR 76 | Temp 98.7°F | Ht 62.0 in | Wt 132.0 lb

## 2023-02-16 DIAGNOSIS — D649 Anemia, unspecified: Secondary | ICD-10-CM

## 2023-02-16 DIAGNOSIS — E10649 Type 1 diabetes mellitus with hypoglycemia without coma: Secondary | ICD-10-CM | POA: Diagnosis not present

## 2023-02-16 DIAGNOSIS — E669 Obesity, unspecified: Secondary | ICD-10-CM | POA: Diagnosis not present

## 2023-02-16 DIAGNOSIS — Z794 Long term (current) use of insulin: Secondary | ICD-10-CM

## 2023-02-16 DIAGNOSIS — Z636 Dependent relative needing care at home: Secondary | ICD-10-CM | POA: Diagnosis not present

## 2023-02-16 DIAGNOSIS — Z6824 Body mass index (BMI) 24.0-24.9, adult: Secondary | ICD-10-CM

## 2023-02-16 DIAGNOSIS — M5440 Lumbago with sciatica, unspecified side: Secondary | ICD-10-CM

## 2023-02-16 DIAGNOSIS — E1069 Type 1 diabetes mellitus with other specified complication: Secondary | ICD-10-CM

## 2023-02-16 MED ORDER — ESCITALOPRAM OXALATE 10 MG PO TABS
10.0000 mg | ORAL_TABLET | Freq: Every day | ORAL | 0 refills | Status: DC
Start: 2023-02-16 — End: 2023-05-25

## 2023-02-16 NOTE — Progress Notes (Signed)
Office: [phone]  /  Fax: [phone]  Weight Summary And Biometrics  Vitals Temp: 98.7 F (37.1 C) BP: 136/80 Pulse Rate: 76 SpO2: 99 %   Anthropometric Measurements Height: 5\' 2"  (1.575 m) Weight: 132 lb (59.9 kg) BMI (Calculated): 24.14 Weight at Last Visit: 133 lb Weight Lost Since Last Visit: 1 lb Weight Gained Since Last Visit: 0 lb Starting Weight: 190 lb Total Weight Loss (lbs): 59 lb (26.8 kg)   Body Composition  Body Fat %: 35.9 % Fat Mass (lbs): 47.4 lbs Muscle Mass (lbs): 80.2 lbs Total Body Water (lbs): 68.6 lbs Visceral Fat Rating : 8    No data recorded Today's Visit #: 14  Starting Date: 09/17/21   Subjective   Chief Complaint: Obesity  Colleen Zuniga is here to discuss her progress with her obesity treatment plan. She is on the the Category 2 Plan and states she is following her eating plan approximately 90 % of the time. She states she is exercising 45 minutes 7 times per week.  Interval History:   Discussed the use of AI scribe software for clinical note transcription with the patient, who gave verbal consent to proceed.  History of Present Illness   The patient, a 62 year old individual with a history of type 1 diabetes and obesity, presents today for medical weight management. She reports a peak weight of 201 pounds and has since lost over 30% of her total body weight. The patient is currently on an insulin pump and has been experiencing frequent hypoglycemic events. She is also on tirzepatide 5mg  once a week and has recently started an exercise program.  The patient reports experiencing a low blood sugar episode during the consultation, which she managed with 15 grams of sugar from candy. She expresses frustration with the frequent hypoglycemic episodes, averaging three to four a day, which she attributes to stress and her brittle diabetes. 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  The patient has been proactive in  managing her diabetes, making changes to her insulin pump settings and maintaining regular contact with her healthcare team. She also reports maintaining a healthy diet, avoiding simple carbs and added sugars, and incorporating healthy fats into her meals.  Despite her efforts, the patient continues to struggle with hypoglycemic episodes, particularly after exercising. She reports no hypoglycemic episodes at night and none during exercise, but often experiences a drop in blood sugar after returning home from her workout.  The patient's body composition continues to improve, with a decrease in body fat percentage and an increase in muscle mass. However, she has been losing weight and expresses no desire to lose more. She reports no feelings of hunger, which she attributes to her medication, tirzepatide.  The patient also mentions concerns about her iron levels and potential anemia, which she plans to discuss with her primary care physician during an upcoming physical. She is currently taking an over-the-counter iron supplement daily with vitamin C.       Orexigenic Control:  Denies problems with appetite and hunger signals.  Denies problems with satiety and satiation.  Denies problems with eating patterns and portion control.  Denies abnormal cravings. Denies feeling deprived or restricted.   Barriers identified: multiple competing priorities, medical comorbidities, and moderate to high levels of stress.   Pharmacotherapy for weight loss: She is currently taking Monjauro with diabetes as the primary indication with adequate clinical response  and without side effects..   Assessment and Plan   Treatment Plan For  Obesity:  Recommended Dietary Goals  Karris is currently in the action stage of change. As such, her goal is to continue weight management plan. She has agreed to: continue current plan  Behavioral Intervention  We discussed the following Behavioral Modification Strategies  today: continue to work on maintaining a reduced calorie state, getting the recommended amount of protein, incorporating whole foods, making healthy choices, staying well hydrated and practicing mindfulness when eating..  Additional resources provided today: None  Recommended Physical Activity Goals  Lareine has been advised to work up to 150 minutes of moderate intensity aerobic activity a week and strengthening exercises 2-3 times per week for cardiovascular health, weight loss maintenance and preservation of muscle mass.   She has agreed to :  Continue current level of physical activity   Pharmacotherapy  We discussed various medication options to help Brittain with her weight loss efforts and we both agreed to : continue current anti-obesity medication regimen  Associated Conditions Addressed Today  Caregiver stress -     Escitalopram Oxalate; Take 1 tablet (10 mg total) by mouth at bedtime.  Dispense: 90 tablet; Refill: 0    Assessment and Plan    Type 1 Diabetes Mellitus   Long-standing Type 1 Diabetes Mellitus is managed with an insulin pump and continuous glucose monitor, with the individual experiencing frequent hypoglycemic events, averaging 3-4 episodes per day, likely exacerbated by stress and the insulin-sensitizing effects of tirzepatide. Her A1c remains well-controlled at 5.4%.  She will consult with the diabetes care team every 7-10 days to adjust insulin pump settings, closely monitor blood glucose levels, distribute carbohydrate intake evenly throughout the day, incorporate healthy fats to slow carbohydrate absorption, and ensure the continuous glucose monitor is functioning properly.   Hypoglycemia   She experiences frequent hypoglycemic episodes, particularly during the day, with blood glucose levels dropping as low as 46 mg/dL, but no nocturnal hypoglycemia is reported. Hypoglycemia unawareness is a concern, with discussions around the risks of encephalopathy and potential  brain damage if untreated. Treatment for hypoglycemia will include 15 grams of fast-acting carbohydrates, considering glucose shots for severe hypoglycemia, setting continuous glucose monitor alarms to alert at higher thresholds, and monitoring for signs of hypoglycemia unawareness.  Obesity   She has successfully lost over 30% of total body weight and is currently maintaining weight, with body fat percentage improving from 43% in April to 35.9% while preserving muscle mass. The current weight management plan will continue, maintaining the tirzepatide [facility]) dose at 5 mg once a week, increasing caloric intake by 200 calories per day using healthy fats such as olive oil, nuts, and avocado, and monitoring weight and body composition.  She is now in the maintenance phase of her weight loss journey and therefore is advisable to increase her calories.  Anemia   She has a follow-up scheduled with PCP to discuss anemia.  Considering age and no clear explanation this may warrants further workup if still present on follow-up labs.  General Health Maintenance   She follows a healthy diet and exercise regimen, with no diabetic retinopathy, and regular eye and physical exams are up to date. The plan is to continue annual physical and eye exams and maintain the current diet and exercise regimen.  Follow-up   We will follow up with the managing physician next month, discuss iron levels and anemia management at the next visit, and ensure continuous glucose monitor and insulin pump settings are optimized.        Objective   Physical  Exam:  Blood pressure 136/80, pulse 76, temperature 98.7 F (37.1 C), height 5\' 2"  (1.575 m), weight 132 lb (59.9 kg), SpO2 99%. Body mass index is 24.14 kg/m.  General: She is overweight, cooperative, alert, well developed, and in no acute distress. PSYCH: Has normal mood, affect and thought process.   HEENT: EOMI, sclerae are anicteric. Lungs: Normal breathing effort,  no conversational dyspnea. Extremities: No edema.  Neurologic: No gross sensory or motor deficits. No tremors or fasciculations noted.    Diagnostic Data Reviewed:  BMET    Component Value Date/Time   NA 137 08/06/2022 2346   NA 140 09/17/2021 1541   K 4.1 08/06/2022 2346   CL 104 08/06/2022 2346   CO2 25 08/06/2022 2346   GLUCOSE 146 (H) 08/06/2022 2346   BUN 10 08/06/2022 2346   BUN 20 09/17/2021 1541   CREATININE 0.97 08/06/2022 2346   CALCIUM 9.3 08/06/2022 2346   GFRNONAA >60 08/06/2022 2346   GFRAA >60 08/08/2019 0920   Lab Results  Component Value Date   HGBA1C 5.9 (H) 03/20/2022   HGBA1C 6.1 (H) 08/11/2016   Lab Results  Component Value Date   INSULIN <0.4 (L) 09/17/2021   Lab Results  Component Value Date   TSH 0.049 (L) 09/17/2021   CBC    Component Value Date/Time   WBC 5.3 08/06/2022 2346   RBC 2.78 (L) 08/06/2022 2346   HGB 8.9 (L) 08/06/2022 2346   HGB 10.7 (L) 01/18/2019 0812   HCT 27.1 (L) 08/06/2022 2346   HCT 33.1 (L) 01/18/2019 0812   PLT 188 08/06/2022 2346   PLT 217 01/18/2019 0812   MCV 97.5 08/06/2022 2346   MCV 92 01/18/2019 0812   MCH 32.0 08/06/2022 2346   MCHC 32.8 08/06/2022 2346   RDW 12.0 08/06/2022 2346   RDW 12.6 01/18/2019 0812   Iron Studies    Component Value Date/Time   IRON 37 01/18/2019 0812   TIBC 233 (L) 01/18/2019 0812   FERRITIN 21 01/18/2019 0812   IRONPCTSAT 16 01/18/2019 0812   Lipid Panel     Component Value Date/Time   CHOL 156 04/29/2018 1354   TRIG 76 04/29/2018 1354   HDL 74 04/29/2018 1354   LDLCALC 67 04/29/2018 1354   Hepatic Function Panel     Component Value Date/Time   PROT 6.2 (L) 08/06/2022 2346   PROT 6.7 09/17/2021 1541   ALBUMIN 3.2 (L) 08/06/2022 2346   ALBUMIN 4.5 09/17/2021 1541   AST 79 (H) 08/06/2022 2346   ALT 88 (H) 08/06/2022 2346   ALKPHOS 240 (H) 08/06/2022 2346   BILITOT 0.6 08/06/2022 2346   BILITOT 0.5 09/17/2021 1541      Component Value Date/Time   TSH 0.049  (L) 09/17/2021 1541   Nutritional Lab Results  Component Value Date   VD25OH 70.8 09/17/2021   VD25OH 41.3 04/29/2018    Follow-Up   Return in about 4 weeks (around 03/16/2023) for For Weight Mangement with Dr. Rikki Spearing.Marland Kitchen She was informed of the importance of frequent follow up visits to maximize her success with intensive lifestyle modifications for her multiple health conditions.  Attestation Statement   Reviewed by clinician on day of visit: allergies, medications, problem list, medical history, surgical history, family history, social history, and previous encounter notes.     Worthy Rancher, MD

## 2023-03-30 ENCOUNTER — Encounter (INDEPENDENT_AMBULATORY_CARE_PROVIDER_SITE_OTHER): Payer: Self-pay | Admitting: Internal Medicine

## 2023-03-30 ENCOUNTER — Ambulatory Visit (INDEPENDENT_AMBULATORY_CARE_PROVIDER_SITE_OTHER): Payer: Medicare Other | Admitting: Internal Medicine

## 2023-03-30 VITALS — BP 132/70 | HR 80 | Temp 97.9°F | Ht 62.0 in | Wt 131.0 lb

## 2023-03-30 DIAGNOSIS — Z6823 Body mass index (BMI) 23.0-23.9, adult: Secondary | ICD-10-CM

## 2023-03-30 DIAGNOSIS — E10649 Type 1 diabetes mellitus with hypoglycemia without coma: Secondary | ICD-10-CM | POA: Diagnosis not present

## 2023-03-30 DIAGNOSIS — E1069 Type 1 diabetes mellitus with other specified complication: Secondary | ICD-10-CM

## 2023-03-30 DIAGNOSIS — E673 Hypervitaminosis D: Secondary | ICD-10-CM | POA: Diagnosis not present

## 2023-03-30 DIAGNOSIS — E669 Obesity, unspecified: Secondary | ICD-10-CM | POA: Diagnosis not present

## 2023-03-30 DIAGNOSIS — Z7985 Long-term (current) use of injectable non-insulin antidiabetic drugs: Secondary | ICD-10-CM

## 2023-03-30 NOTE — Assessment & Plan Note (Signed)
I reviewed labs on her phone since they are not available in Care Everywhere.  She had a vitamin D level in the 90s.  She has been taking a multivitamin, calcium and vitamin D and some vitamin D Gummies.  Advised to reduce vitamin D Gummies to every other day.  Counseled on the risk of toxicity.  Recommend checking levels in 2 months and holding vitamin D supplementation 2 days prior to testing.

## 2023-03-30 NOTE — Progress Notes (Signed)
Office: [phone]  /  Fax: [phone]  Weight Summary And Biometrics  Vitals Temp: 97.9 F (36.6 C) BP: 132/70 Pulse Rate: 80 SpO2: 99 %   Anthropometric Measurements Height: 5\' 2"  (1.575 m) Weight: 131 lb (59.4 kg) BMI (Calculated): 23.95 Weight at Last Visit: 132 lb Weight Lost Since Last Visit: 1 lb Weight Gained Since Last Visit: 0 lb Starting Weight: 190 lb Total Weight Loss (lbs): 60 lb (27.2 kg)   Body Composition  Body Fat %: 36.1 % Fat Mass (lbs): 47.2 lbs Muscle Mass (lbs): 79.6 lbs Total Body Water (lbs): 69.8 lbs Visceral Fat Rating : 8    No data recorded Today's Visit #: 15  Starting Date: 09/17/21   Subjective   Chief Complaint: Obesity  Colleen Zuniga is here to discuss her progress with her obesity treatment plan. She is on the the Category 2 Plan and states she is following her eating plan approximately 90 % of the time. She states she is exercising 45-90 minutes 5 times per week.  Weight Progress Since Last Visit:  Discussed the use of AI scribe software for clinical note transcription with the patient, who gave verbal consent to proceed.  History of Present Illness   The patient, with a history of type 1 diabetes and degenerative arthritis, presents for a medical weight management consultation. She reports improved blood sugar control, with fewer hypoglycemic episodes, particularly at night. However, she continues to struggle with hypoglycemia following exercise, despite careful management of insulin timing and activity levels.  The patient has been successful in weight loss, with a current BMI of 23 and body fat percentage under 36. She reports maintaining a consistent regimen of physical activity and dietary control, including increased caloric intake and incorporation of healthy fats. Despite this, there has been a slow, continued weight loss over the past several months.  In addition to diabetes management and weight loss, the patient is  also dealing with chronic pain related to degenerative arthritis. She is currently undergoing physical therapy, including dry needling and core exercises, to manage this pain. She reports taking gabapentin occasionally for severe pain, and has been reducing her dosage.  The patient also reports a history of anemia, which has recently resolved. She has discontinued iron supplementation and reports improved bowel movements as a result. She continues to take a multivitamin with minerals and has been advised to reduce her vitamin D supplementation due to high serum levels.  Overall, the patient demonstrates a proactive approach to her health, with a focus on maintaining her current weight and managing her diabetes and arthritis symptoms. She expresses a strong commitment to her health regimen and a determination to prevent any regression in her progress.       Patient Reported Barriers to Progress: orthopedic problems, medical conditions or chronic pain affecting mobility, medical comorbidities, and presence of obesogenic drugs.   Orexigenic Control: Denies problems with appetite and hunger signals.  Denies problems with satiety and satiation.  Denies problems with eating patterns and portion control.  Denies abnormal cravings. Denies feeling deprived or restricted.   Pharmacotherapy for weight management: She is currently taking Monjauro with diabetes as the primary indication with adequate clinical response  and without side effects..   Assessment and Plan   Treatment Plan For Obesity:  Recommended Dietary Goals  Colleen Zuniga is currently in the action stage of change. As such, her goal is to continue weight management plan. She has agreed to: continue current plan  Behavioral Health and Counseling  We discussed the following behavioral modification strategies today: continue to work on maintaining a reduced calorie state, getting the recommended amount of protein, incorporating whole foods,  making healthy choices, staying well hydrated and practicing mindfulness when eating..  Additional education and resources provided today: None  Recommended Physical Activity Goals  Colleen Zuniga has been advised to work up to 150 minutes of moderate intensity aerobic activity a week and strengthening exercises 2-3 times per week for cardiovascular health, weight loss maintenance and preservation of muscle mass.   She has agreed to :  Think about enjoyable ways to increase daily physical activity and overcoming barriers to exercise and Increase physical activity in their day and reduce sedentary time (increase NEAT).  Pharmacotherapy  We discussed various medication options to help Colleen Zuniga with her weight loss efforts and we both agreed to : continue current anti-obesity medication regimen  Associated Conditions Impacted by Obesity Treatment  Type 1 diabetes mellitus with other specified complication [facility]) Assessment & Plan: She reports her most recent A1c was 5.3, she is on insulin pump and continuous monitoring.  She is on Mounjaro 5 mg once a week which has helped with orixegenic signaling and glycemic control.  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  She reports reduction in hypoglycemic events but may occasionally drop after physical activity despite adjusting insulin rates.  Continue current regimen as per endocrinology.   Generalized obesity with a starting BMI of 34 Assessment & Plan: Patient is currently in the maintenance phase.  She has lost 60 pounds with improvement in VAT and SAT.  Which supports a healthier body composition.  We had advised to increase her calories to avoid further weight loss by introducing healthy fats into her diet.  Continue with self monitoring.  Continue medically supervised weight management program.  We will see her in 2 months   High vitamin D level Assessment &  Plan: I reviewed labs on her phone since they are not available in Care Everywhere.  She had a vitamin D level in the 90s.  She has been taking a multivitamin, calcium and vitamin D and some vitamin D Gummies.  Advised to reduce vitamin D Gummies to every other day.  Counseled on the risk of toxicity.  Recommend checking levels in 2 months and holding vitamin D supplementation 2 days prior to testing.         Objective   Physical Exam:  Blood pressure 132/70, pulse 80, temperature 97.9 F (36.6 C), height 5\' 2"  (1.575 m), weight 131 lb (59.4 kg), SpO2 99%. Body mass index is 23.96 kg/m.  General: She is overweight, cooperative, alert, well developed, and in no acute distress. PSYCH: Has normal mood, affect and thought process.   HEENT: EOMI, sclerae are anicteric. Lungs: Normal breathing effort, no conversational dyspnea. Extremities: No edema.  Neurologic: No gross sensory or motor deficits. No tremors or fasciculations noted.    Diagnostic Data Reviewed:  BMET    Component Value Date/Time   NA 137 08/06/2022 2346   NA 140 09/17/2021 1541   K 4.1 08/06/2022 2346   CL 104 08/06/2022 2346   CO2 25 08/06/2022 2346   GLUCOSE 146 (H) 08/06/2022 2346   BUN 10 08/06/2022 2346   BUN 20 09/17/2021 1541   CREATININE 0.97 08/06/2022 2346   CALCIUM 9.3 08/06/2022 2346   GFRNONAA >60 08/06/2022 2346   GFRAA >60 08/08/2019 0920   Lab Results  Component Value  Date   HGBA1C 5.9 (H) 03/20/2022   HGBA1C 6.1 (H) 08/11/2016   Lab Results  Component Value Date   INSULIN <0.4 (L) 09/17/2021   Lab Results  Component Value Date   TSH 0.049 (L) 09/17/2021   CBC    Component Value Date/Time   WBC 5.3 08/06/2022 2346   RBC 2.78 (L) 08/06/2022 2346   HGB 8.9 (L) 08/06/2022 2346   HGB 10.7 (L) 01/18/2019 0812   HCT 27.1 (L) 08/06/2022 2346   HCT 33.1 (L) 01/18/2019 0812   PLT 188 08/06/2022 2346   PLT 217 01/18/2019 0812   MCV 97.5 08/06/2022 2346   MCV 92 01/18/2019 0812    MCH 32.0 08/06/2022 2346   MCHC 32.8 08/06/2022 2346   RDW 12.0 08/06/2022 2346   RDW 12.6 01/18/2019 0812   Iron Studies    Component Value Date/Time   IRON 37 01/18/2019 0812   TIBC 233 (L) 01/18/2019 0812   FERRITIN 21 01/18/2019 0812   IRONPCTSAT 16 01/18/2019 0812   Lipid Panel     Component Value Date/Time   CHOL 156 04/29/2018 1354   TRIG 76 04/29/2018 1354   HDL 74 04/29/2018 1354   LDLCALC 67 04/29/2018 1354   Hepatic Function Panel     Component Value Date/Time   PROT 6.2 (L) 08/06/2022 2346   PROT 6.7 09/17/2021 1541   ALBUMIN 3.2 (L) 08/06/2022 2346   ALBUMIN 4.5 09/17/2021 1541   AST 79 (H) 08/06/2022 2346   ALT 88 (H) 08/06/2022 2346   ALKPHOS 240 (H) 08/06/2022 2346   BILITOT 0.6 08/06/2022 2346   BILITOT 0.5 09/17/2021 1541      Component Value Date/Time   TSH 0.049 (L) 09/17/2021 1541   Nutritional Lab Results  Component Value Date   VD25OH 70.8 09/17/2021   VD25OH 41.3 04/29/2018    Follow-Up   Return in about 2 months (around 05/28/2023) for For Weight Mangement with Dr. Rikki Spearing.Marland Kitchen She was informed of the importance of frequent follow up visits to maximize her success with intensive lifestyle modifications for her multiple health conditions.  Attestation Statement   Reviewed by clinician on day of visit: allergies, medications, problem list, medical history, surgical history, family history, social history, and previous encounter notes.   I have spent 30 minutes in the care of the patient today including: preparing to see patient (e.g. review and interpretation of tests, old notes ), obtaining and/or reviewing separately obtained history, performing a medically appropriate examination or evaluation, counseling and educating the patient, documenting clinical information in the electronic or other health care record, independently interpreting results and communicating results to the patient, family, or caregiver, and reviewing labs on patient's  phone   Worthy Rancher, MD

## 2023-03-30 NOTE — Assessment & Plan Note (Signed)
She reports her most recent A1c was 5.3, she is on insulin pump and continuous monitoring.  She is on Mounjaro 5 mg once a week which has helped with orixegenic signaling and glycemic control.  Continue current regimen

## 2023-03-30 NOTE — Assessment & Plan Note (Signed)
Improved.  She reports reduction in hypoglycemic events but may occasionally drop after physical activity despite adjusting insulin rates.  Continue current regimen as per endocrinology.

## 2023-03-30 NOTE — Assessment & Plan Note (Signed)
Patient is currently in the maintenance phase.  She has lost 60 pounds with improvement in VAT and SAT.  Which supports a healthier body composition.  We had advised to increase her calories to avoid further weight loss by introducing healthy fats into her diet.  Continue with self monitoring.  Continue medically supervised weight management program.  We will see her in 2 months

## 2023-05-22 ENCOUNTER — Other Ambulatory Visit (INDEPENDENT_AMBULATORY_CARE_PROVIDER_SITE_OTHER): Payer: Self-pay | Admitting: Internal Medicine

## 2023-05-22 DIAGNOSIS — Z636 Dependent relative needing care at home: Secondary | ICD-10-CM

## 2023-05-25 ENCOUNTER — Ambulatory Visit (INDEPENDENT_AMBULATORY_CARE_PROVIDER_SITE_OTHER): Payer: Medicare Other | Admitting: Internal Medicine

## 2023-05-25 ENCOUNTER — Encounter (INDEPENDENT_AMBULATORY_CARE_PROVIDER_SITE_OTHER): Payer: Self-pay | Admitting: Internal Medicine

## 2023-05-25 VITALS — BP 133/71 | HR 69 | Temp 97.6°F | Ht 62.0 in | Wt 131.0 lb

## 2023-05-25 DIAGNOSIS — E1069 Type 1 diabetes mellitus with other specified complication: Secondary | ICD-10-CM

## 2023-05-25 DIAGNOSIS — Z6823 Body mass index (BMI) 23.0-23.9, adult: Secondary | ICD-10-CM | POA: Diagnosis not present

## 2023-05-25 DIAGNOSIS — Z636 Dependent relative needing care at home: Secondary | ICD-10-CM

## 2023-05-25 DIAGNOSIS — Z794 Long term (current) use of insulin: Secondary | ICD-10-CM

## 2023-05-25 DIAGNOSIS — E669 Obesity, unspecified: Secondary | ICD-10-CM

## 2023-05-25 DIAGNOSIS — Z7985 Long-term (current) use of injectable non-insulin antidiabetic drugs: Secondary | ICD-10-CM

## 2023-05-25 MED ORDER — ESCITALOPRAM OXALATE 10 MG PO TABS: 10.0000 mg | ORAL_TABLET | Freq: Every day | ORAL | 0 refills | Status: DC

## 2023-05-25 NOTE — Progress Notes (Signed)
 Office: [phone]  /  Fax: [phone]  Weight Summary And Biometrics  Vitals Temp: 97.6 F (36.4 C) BP: 133/71 Pulse Rate: 69 SpO2: 98 %   Anthropometric Measurements Height: 5\' 2"  (1.575 m) Weight: 131 lb (59.4 kg) BMI (Calculated): 23.95 Weight at Last Visit: 131 lb Weight Lost Since Last Visit: 0 Weight Gained Since Last Visit: 0 Starting Weight: 190 lb Total Weight Loss (lbs): 59 lb (26.8 kg) Peak Weight: 202 lb   Body Composition  Body Fat %: 37.1 % Fat Mass (lbs): 48.8 lbs Muscle Mass (lbs): 78.6 lbs Total Body Water (lbs): 69.4 lbs Visceral Fat Rating : 8    No data recorded Today's Visit #: 16  Starting Date: 09/17/21   Subjective   Chief Complaint: Obesity  Interval History Discussed the use of AI scribe software for clinical note transcription with the patient, who gave verbal consent to proceed.  History of Present Illness   Colleen Zuniga is a 63 year old female with diabetes and scoliosis who presents for medical weight management.  She is in the maintenance phase  She is currently in the weight maintenance phase of her medical weight management program and has maintained her weight since the last office visit. She follows a 1200 calorie meal plan approximately 85% of the time, focusing on whole foods, adequate protein, and hydration. Occasionally, she skips lunch due to caregiving responsibilities but compensates with protein snacks like yogurt or low-fat cheese sticks. She exercises 5-7 days a week for about 30 minutes, including strength training, cardio, and yoga. She experiences higher stress levels due to caregiving for her husband and mother.  She has diabetes and is on Mounjaro 5 mg. Her episodes of hypoglycemia have improved and are now rare. A recent incident of forgetting her insulin pump led to hyperglycemia, which she managed carefully to avoid diabetic ketoacidosis. She takes Lipitor 10 mg for cardiovascular prevention and  Avapro for kidney protection. Her last A1c was in the 5s, and her blood pressure was 133/71.  She has scoliosis and is undergoing physical therapy, including dry needling, for worsening spinal issues, particularly at C5-6 and C2-3. 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  She is on Wellbutrin and Lexapro, which she finds helpful in managing stress related to caregiving. She reports no undesirable side effects from Lexapro.  She experiences occasional skin itching, which she manages with a [facility] anti-itch cream. Her skin gets 'so itchy' and sometimes develops blood blisters from scratching.        Challenges affecting patient progress: multiple competing priorities, orthopedic problems, medical conditions or chronic pain affecting mobility, and caregiver stress .    Pharmacotherapy for weight management: She is currently taking Monjauro with diabetes as the primary indication with adequate clinical response  and without side effects..   Assessment and Plan   Treatment Plan For Obesity:  Recommended Dietary Goals  Colleen Zuniga is currently in the action stage of change. As such, her goal is to continue weight management plan. She has agreed to: continue current plan  Behavioral Health and Counseling  We discussed the following behavioral modification strategies today: continue to work on maintaining a reduced calorie state, getting the recommended amount of protein, incorporating whole foods, making healthy choices, staying well hydrated and practicing mindfulness when eating..  Additional education and resources provided today: None  Recommended Physical Activity Goals  Colleen Zuniga has been advised to work up to 150 minutes of moderate intensity aerobic activity a week  and strengthening exercises 2-3 times per week for cardiovascular health, weight loss maintenance and preservation of muscle mass.   She has agreed to :  Continue current level of physical activity    Pharmacotherapy  We discussed various medication options to help Colleen Zuniga with her weight loss efforts and we both agreed to : adequate clinical response to current dose, continue current regimen  Associated Conditions Impacted by Obesity Treatment  Type 1 diabetes mellitus with other specified complication (HCC)  Caregiver stress -     Escitalopram Oxalate; Take 1 tablet (10 mg total) by mouth at bedtime.  Dispense: 90 tablet; Refill: 0  Generalized obesity with a starting BMI of 34      General Health Maintenance Maintaining a healthy lifestyle with regular exercise and a balanced diet. Discussed importance of hydration and skin care to prevent winter's itch. Discussed potential risks of aspirin use given current medications, including increased bruising and ulcers. - Use a humidifier in the bedroom to prevent dry skin - Avoid hot showers and drying soaps - Apply petrolatum-based moisturizer after showering  Follow-up Prefers regular follow-ups to maintain accountability and monitor health status. - Schedule follow-up appointment in 2 months - Send medication refills to [facility] for 90-day supply          Objective   Physical Exam:  Blood pressure 133/71, pulse 69, temperature 97.6 F (36.4 C), height 5\' 2"  (1.575 m), weight 131 lb (59.4 kg), SpO2 98%. Body mass index is 23.96 kg/m.  General: She is overweight, cooperative, alert, well developed, and in no acute distress. PSYCH: Has normal mood, affect and thought process.   HEENT: EOMI, sclerae are anicteric. Lungs: Normal breathing effort, no conversational dyspnea. Extremities: No edema.  Neurologic: No gross sensory or motor deficits. No tremors or fasciculations noted.    Diagnostic Data Reviewed:  BMET    Component Value Date/Time   NA 137 08/06/2022 2346   NA 140 09/17/2021 1541   K 4.1 08/06/2022 2346   CL 104 08/06/2022 2346   CO2 25 08/06/2022 2346   GLUCOSE 146 (H) 08/06/2022 2346   BUN 10  08/06/2022 2346   BUN 20 09/17/2021 1541   CREATININE 0.97 08/06/2022 2346   CALCIUM 9.3 08/06/2022 2346   GFRNONAA >60 08/06/2022 2346   GFRAA >60 08/08/2019 0920   Lab Results  Component Value Date   HGBA1C 5.9 (H) 03/20/2022   HGBA1C 6.1 (H) 08/11/2016   Lab Results  Component Value Date   INSULIN <0.4 (L) 09/17/2021   Lab Results  Component Value Date   TSH 0.049 (L) 09/17/2021   CBC    Component Value Date/Time   WBC 5.3 08/06/2022 2346   RBC 2.78 (L) 08/06/2022 2346   HGB 8.9 (L) 08/06/2022 2346   HGB 10.7 (L) 01/18/2019 0812   HCT 27.1 (L) 08/06/2022 2346   HCT 33.1 (L) 01/18/2019 0812   PLT 188 08/06/2022 2346   PLT 217 01/18/2019 0812   MCV 97.5 08/06/2022 2346   MCV 92 01/18/2019 0812   MCH 32.0 08/06/2022 2346   MCHC 32.8 08/06/2022 2346   RDW 12.0 08/06/2022 2346   RDW 12.6 01/18/2019 0812   Iron Studies    Component Value Date/Time   IRON 37 01/18/2019 0812   TIBC 233 (L) 01/18/2019 0812   FERRITIN 21 01/18/2019 0812   IRONPCTSAT 16 01/18/2019 0812   Lipid Panel     Component Value Date/Time   CHOL 156 04/29/2018 1354   TRIG 76 04/29/2018 1354  HDL 74 04/29/2018 1354   LDLCALC 67 04/29/2018 1354   Hepatic Function Panel     Component Value Date/Time   PROT 6.2 (L) 08/06/2022 2346   PROT 6.7 09/17/2021 1541   ALBUMIN 3.2 (L) 08/06/2022 2346   ALBUMIN 4.5 09/17/2021 1541   AST 79 (H) 08/06/2022 2346   ALT 88 (H) 08/06/2022 2346   ALKPHOS 240 (H) 08/06/2022 2346   BILITOT 0.6 08/06/2022 2346   BILITOT 0.5 09/17/2021 1541      Component Value Date/Time   TSH 0.049 (L) 09/17/2021 1541   Nutritional Lab Results  Component Value Date   VD25OH 70.8 09/17/2021   VD25OH 41.3 04/29/2018    Medications: Outpatient Encounter Medications as of 05/25/2023  Medication Sig Note   albuterol (VENTOLIN HFA) 108 (90 Base) MCG/ACT inhaler Inhale 2 puffs into the lungs every 4 (four) hours as needed for shortness of breath or wheezing.     ALPRAZolam (XANAX) 0.5 MG tablet Take 0.5 mg by mouth every 6 (six) hours as needed for sleep or anxiety.    atorvastatin (LIPITOR) 10 MG tablet Take 10 mg by mouth daily after supper.    BAQSIMI TWO PACK 3 MG/DOSE POWD Place 3 mg into the nose as needed (hypoglycemia).    buPROPion (WELLBUTRIN XL) 300 MG 24 hr tablet Take 300 mg by mouth daily after breakfast.    Calcium Carb-Cholecalciferol (CALCIUM 500 + D PO) Take 2 tablets by mouth 2 (two) times daily.    Continuous Glucose Monitor DEVI 1 Device by Other route continuous.    docusate sodium (COLACE) 100 MG capsule Take 100 mg by mouth every evening.    fluticasone (FLONASE) 50 MCG/ACT nasal spray Place 2 sprays into both nostrils daily as needed for allergies.    gabapentin (NEURONTIN) 300 MG capsule Take 300 mg by mouth 3 (three) times daily. 12/04/2021: [facility] only!   insulin aspart (NOVOLOG) 100 UNIT/ML injection Continuous with insulin pump 12/04/2021: Averages about 26 units per day   irbesartan (AVAPRO) 300 MG tablet Take 300 mg by mouth daily after breakfast.    levocetirizine (XYZAL) 5 MG tablet Take 5 mg by mouth daily.    levothyroxine (SYNTHROID) 100 MCG tablet Take 100 mcg by mouth daily before breakfast.    Magnesium 250 MG TABS Take 500 mg by mouth at bedtime.    methocarbamol (ROBAXIN) 500 MG tablet Take 1 tablet (500 mg total) by mouth every 6 (six) hours as needed for muscle spasms.    metoCLOPramide (REGLAN) 5 MG tablet Take 5 mg by mouth every 6 (six) hours as needed for nausea.    Multiple Vitamins-Minerals (AIRBORNE PO) Take 2 tablets by mouth daily.    ondansetron (ZOFRAN-ODT) 8 MG disintegrating tablet Take 1 tablet (8 mg total) by mouth every 8 (eight) hours as needed for nausea or vomiting.    ONETOUCH ULTRA test strip 1 each by Other route 3 (three) times daily.    Polyvinyl Alcohol-Povidone (REFRESH OP) Place 1 drop into both eyes in the morning and at bedtime.    potassium gluconate 595 (99 K) MG TABS  tablet Take 595 mg by mouth daily after breakfast.    Probiotic Product (FORTIFY DAILY PROBIOTIC) CAPS Take 1 capsule by mouth daily.    tirzepatide [facility]) 5 MG/0.5ML Pen Inject 5 mg into the skin once a week. 08/02/2022: Take on Wednesdays per patient    traMADol (ULTRAM) 50 MG tablet Take 1-2 tablets (50-100 mg total) by mouth every 6 (six) hours  as needed. 08/02/2022: Still has on hand   vitamin C (ASCORBIC ACID) 250 MG tablet Take 500 mg by mouth 2 (two) times daily.    [DISCONTINUED] escitalopram (LEXAPRO) 10 MG tablet Take 1 tablet (10 mg total) by mouth at bedtime.    escitalopram (LEXAPRO) 10 MG tablet Take 1 tablet (10 mg total) by mouth at bedtime.    No facility-administered encounter medications on file as of 05/25/2023.     Follow-Up   Return in about 2 months (around 07/25/2023).Marland Kitchen She was informed of the importance of frequent follow up visits to maximize her success with intensive lifestyle modifications for her multiple health conditions.  Attestation Statement   Reviewed by clinician on day of visit: allergies, medications, problem list, medical history, surgical history, family history, social history, and previous encounter notes.     Worthy Rancher, MD

## 2023-05-25 NOTE — Assessment & Plan Note (Signed)
 Due to husband who has dementia.  She is also caring for her 65 year old mother.  She is currently on Lexapro 10 mg once daily and Wellbutrin without any adverse effects and has benefited from medication.  She will continue medication.  Medication refilled today

## 2023-05-25 NOTE — Assessment & Plan Note (Signed)
 She reports her most recent A1c was 5.3, she is on insulin pump and continuous monitoring.  She is on Mounjaro 5 mg once a week which has helped with orixegenic signaling and glycemic control.  She is no longer experiencing hypoglycemic episodes.  Continue current regimen

## 2023-05-25 NOTE — Assessment & Plan Note (Signed)
 Patient in maintenance phase She is in the weight maintenance phase, successfully maintaining her weight since the last visit. Adheres to a 1200 calorie meal plan 85% of the time, incorporating whole foods, adequate protein, and hydration. Occasionally skips lunch due to caregiving responsibilities. Engages in regular exercise 5-7 days a week, including strength training, cardio, and yoga. Experiencing higher stress levels due to caregiving duties. - Continue current dietary plan with emphasis on balanced meals - Encourage maintaining regular exercise routine - Monitor weight weekly - Increase caloric intake slightly to maintain weight

## 2023-08-05 ENCOUNTER — Other Ambulatory Visit (INDEPENDENT_AMBULATORY_CARE_PROVIDER_SITE_OTHER): Payer: Self-pay | Admitting: Internal Medicine

## 2023-08-05 DIAGNOSIS — Z636 Dependent relative needing care at home: Secondary | ICD-10-CM

## 2023-08-06 ENCOUNTER — Ambulatory Visit (INDEPENDENT_AMBULATORY_CARE_PROVIDER_SITE_OTHER): Admitting: Internal Medicine

## 2023-08-06 ENCOUNTER — Encounter (INDEPENDENT_AMBULATORY_CARE_PROVIDER_SITE_OTHER): Payer: Self-pay | Admitting: Internal Medicine

## 2023-08-06 VITALS — BP 100/58 | HR 65 | Temp 98.7°F | Ht 62.0 in | Wt 128.0 lb

## 2023-08-06 DIAGNOSIS — E1069 Type 1 diabetes mellitus with other specified complication: Secondary | ICD-10-CM

## 2023-08-06 DIAGNOSIS — Z6823 Body mass index (BMI) 23.0-23.9, adult: Secondary | ICD-10-CM | POA: Diagnosis not present

## 2023-08-06 DIAGNOSIS — E669 Obesity, unspecified: Secondary | ICD-10-CM | POA: Diagnosis not present

## 2023-08-06 DIAGNOSIS — Z636 Dependent relative needing care at home: Secondary | ICD-10-CM

## 2023-08-06 DIAGNOSIS — E10649 Type 1 diabetes mellitus with hypoglycemia without coma: Secondary | ICD-10-CM | POA: Diagnosis not present

## 2023-08-06 MED ORDER — ESCITALOPRAM OXALATE 10 MG PO TABS: 10.0000 mg | ORAL_TABLET | Freq: Every day | ORAL | 0 refills | Status: DC

## 2023-08-06 NOTE — Progress Notes (Signed)
 Office: [phone]  /  Fax: [phone]  Weight Summary And Biometrics  Vitals Temp: 98.7 F (37.1 C) BP: (!) 100/58 Pulse Rate: 65 SpO2: 100 %   Anthropometric Measurements Height: 5\' 2"  (1.575 m) Weight: 128 lb (58.1 kg) BMI (Calculated): 23.41 Weight at Last Visit: 131lb Weight Lost Since Last Visit: 4lb Weight Gained Since Last Visit: 0lb Starting Weight: 190lb Total Weight Loss (lbs): 63 lb (28.6 kg) Peak Weight: 202lb   Body Composition  Body Fat %: 35.1 % Fat Mass (lbs): 45 lbs Muscle Mass (lbs): 78.8 lbs Total Body Water  (lbs): 66.2 lbs Visceral Fat Rating : 8    RMR: 1598  Today's Visit #: 17  Starting Date: 09/17/21   Subjective   Chief Complaint: Obesity  Interval History Discussed the use of AI scribe software for clinical note transcription with the patient, who gave verbal consent to proceed.  History of Present Illness   Colleen Zuniga is a 63 year old female with type 1 diabetes who presents for medical weight management.  She is in the maintenance phase of her weight management program, having lost a total of 63 pounds, which is 23% of her total body weight. Since the last visit, she has lost an additional 3 pounds. She adheres to a 1200 calorie nutrition plan, focusing on whole foods, adequate protein, and hydration, though she occasionally skips meals. She exercises daily, engaging in 60 minutes of walking and strength training. She experiences inadequate sleep and high stress levels.  She has a history of type 1 diabetes and uses an insulin  pump. In April, she experienced two severe hypoglycemic events, one resulting in unconsciousness for several hours and requiring a glucose drip. She is currently on Mounjaro 5 mg once daily. She sometimes experiences hypoglycemia unawareness and rapid drops in blood sugar levels. She is considering obtaining a diabetic medical alert dog for additional safety.  She is followed by endocrinology and  her insulin  has been adjusted.  She is no longer having hypoglycemic spells.  Her husband's declining health has increased her caregiving responsibilities and stress levels. She manages her stress with Lexapro , which she finds very helpful. Her husband's condition has added to her stress and workload, as he requires more assistance and is undergoing physical therapy.       Challenges affecting patient progress: inadequate sleep and moderate to high levels of stress.    Pharmacotherapy for weight management: She is currently taking Monjauro with diabetes as the primary indication with adequate clinical response  and without side effects..   Assessment and Plan   Treatment Plan For Obesity:  Recommended Dietary Goals  Colleen Zuniga is currently in the action stage of change. As such, her goal is to continue weight management plan. She has agreed to: continue current plan  Behavioral Health and Counseling  We discussed the following behavioral modification strategies today: continue to work on maintaining a reduced calorie state, getting the recommended amount of protein, incorporating whole foods, making healthy choices, staying well hydrated and practicing mindfulness when eating. and increase calorie for weight maintenance.  Additional education and resources provided today: None  Recommended Physical Activity Goals  Colleen Zuniga has been advised to work up to 150 minutes of moderate intensity aerobic activity a week and strengthening exercises 2-3 times per week for cardiovascular health, weight loss maintenance and preservation of muscle mass.   She has agreed to :  Think about enjoyable ways to increase daily physical activity and overcoming barriers to exercise and Increase  physical activity in their day and reduce sedentary time (increase NEAT).  Pharmacotherapy  We discussed various medication options to help Colleen Zuniga with her weight loss efforts and we both agreed to : adequate clinical  response to anti-obesity medication, continue current regimen  Associated Conditions Impacted by Obesity Treatment  There are no diagnoses linked to this encounter.   Assessment and Plan    Type 1 Diabetes Mellitus Long-standing Type 1 Diabetes Mellitus with recent severe hypoglycemia episodes. Adjustments to insulin  basal rates have been made. She uses a CGM and insulin  pump with Control IQ technology. Stress and lifestyle changes contribute to blood glucose variability. The GLP-1 agonist Mounjaro enhances rapid-acting insulin  effects, increasing hypoglycemia risk. She seeks a diabetic medical alert dog for safety. Advised to maintain higher blood glucose levels to prevent hypoglycemia and exercise caution with tight glycemic control. - Continue current insulin  regimen with adjusted basal rates. - Maintain higher blood glucose levels to prevent hypoglycemia. - Monitor blood glucose levels closely, especially during stress. - Optimize CGM and insulin  pump settings. - Proceed with diabetic medical alert dog application. - Educate on GLP-1 agonists' effects on insulin  sensitivity and glucose levels. - Encourage use of Apple Watch for emergency alerts and fall detection.  Severe Hypoglycemia Recent severe hypoglycemic episodes with loss of consciousness, attributed to stress, lifestyle changes, and insulin  regimen. She is sometimes hypo unaware and experiences rapid blood glucose drops. Emphasis on maintaining higher blood glucose targets to prevent hypoglycemia and avoiding tight glycemic control. - Maintain higher blood glucose targets to prevent hypoglycemia. - Work with a certified diabetes educator to adjust insulin  regimen as needed. - Utilize CGM and insulin  pump with Control IQ for blood glucose management. - Educate on avoiding tight glycemic control to prevent hypoglycemia. - Encourage assistance from a diabetic medical alert dog for early hypoglycemia detection.  Obesity In the  maintenance phase of weight management with a 23% reduction in total body weight. Adheres to a 1200 calorie nutrition plan and regular exercise regimen. Muscle mass preserved with increased protein intake and strength training. Emphasis on consistency in lifestyle modifications to maintain weight loss. - Continue current nutrition plan focusing on protein intake. - Maintain regular exercise regimen including walking and strength training. - Encourage consistency in lifestyle modifications to maintain weight loss.  Caregiver stress managed with Lexapro , effective in coping with stress and caregiving responsibilities. Reports improved acceptance of personal circumstances and reduced emotional distress. - Continue Lexapro  as prescribed. - Monitor mental health status and stress levels.  Recording duration: 22 minutes        Objective   Physical Exam:  Blood pressure (!) 100/58, pulse 65, temperature 98.7 F (37.1 C), height 5\' 2"  (1.575 m), weight 128 lb (58.1 kg), SpO2 100%. Body mass index is 23.41 kg/m.  General: She is overweight, cooperative, alert, well developed, and in no acute distress. PSYCH: Has normal mood, affect and thought process.   HEENT: EOMI, sclerae are anicteric. Lungs: Normal breathing effort, no conversational dyspnea. Extremities: No edema.  Neurologic: No gross sensory or motor deficits. No tremors or fasciculations noted.    Diagnostic Data Reviewed:  BMET    Component Value Date/Time   NA 137 08/06/2022 2346   NA 140 09/17/2021 1541   K 4.1 08/06/2022 2346   CL 104 08/06/2022 2346   CO2 25 08/06/2022 2346   GLUCOSE 146 (H) 08/06/2022 2346   BUN 10 08/06/2022 2346   BUN 20 09/17/2021 1541   CREATININE 0.97 08/06/2022 2346  CALCIUM  9.3 08/06/2022 2346   GFRNONAA >60 08/06/2022 2346   GFRAA >60 08/08/2019 0920   Lab Results  Component Value Date   HGBA1C 5.9 (H) 03/20/2022   HGBA1C 6.1 (H) 08/11/2016   Lab Results  Component Value Date    INSULIN  <0.4 (L) 09/17/2021   Lab Results  Component Value Date   TSH 0.049 (L) 09/17/2021   CBC    Component Value Date/Time   WBC 5.3 08/06/2022 2346   RBC 2.78 (L) 08/06/2022 2346   HGB 8.9 (L) 08/06/2022 2346   HGB 10.7 (L) 01/18/2019 0812   HCT 27.1 (L) 08/06/2022 2346   HCT 33.1 (L) 01/18/2019 0812   PLT 188 08/06/2022 2346   PLT 217 01/18/2019 0812   MCV 97.5 08/06/2022 2346   MCV 92 01/18/2019 0812   MCH 32.0 08/06/2022 2346   MCHC 32.8 08/06/2022 2346   RDW 12.0 08/06/2022 2346   RDW 12.6 01/18/2019 0812   Iron Studies    Component Value Date/Time   IRON 37 01/18/2019 0812   TIBC 233 (L) 01/18/2019 0812   FERRITIN 21 01/18/2019 0812   IRONPCTSAT 16 01/18/2019 0812   Lipid Panel     Component Value Date/Time   CHOL 156 04/29/2018 1354   TRIG 76 04/29/2018 1354   HDL 74 04/29/2018 1354   LDLCALC 67 04/29/2018 1354   Hepatic Function Panel     Component Value Date/Time   PROT 6.2 (L) 08/06/2022 2346   PROT 6.7 09/17/2021 1541   ALBUMIN  3.2 (L) 08/06/2022 2346   ALBUMIN  4.5 09/17/2021 1541   AST 79 (H) 08/06/2022 2346   ALT 88 (H) 08/06/2022 2346   ALKPHOS 240 (H) 08/06/2022 2346   BILITOT 0.6 08/06/2022 2346   BILITOT 0.5 09/17/2021 1541      Component Value Date/Time   TSH 0.049 (L) 09/17/2021 1541   Nutritional Lab Results  Component Value Date   VD25OH 70.8 09/17/2021   VD25OH 41.3 04/29/2018    Medications: Outpatient Encounter Medications as of 08/06/2023  Medication Sig Note   albuterol  (VENTOLIN  HFA) 108 (90 Base) MCG/ACT inhaler Inhale 2 puffs into the lungs every 4 (four) hours as needed for shortness of breath or wheezing.    ALPRAZolam  (XANAX ) 0.5 MG tablet Take 0.5 mg by mouth every 6 (six) hours as needed for sleep or anxiety.    atorvastatin  (LIPITOR) 10 MG tablet Take 10 mg by mouth daily after supper.    BAQSIMI  TWO PACK 3 MG/DOSE POWD Place 3 mg into the nose as needed (hypoglycemia).    buPROPion  (WELLBUTRIN  XL) 300 MG 24  hr tablet Take 300 mg by mouth daily after breakfast.    Calcium  Carb-Cholecalciferol (CALCIUM  500 + D PO) Take 2 tablets by mouth 2 (two) times daily.    Continuous Glucose Monitor DEVI 1 Device by Other route continuous.    docusate sodium  (COLACE) 100 MG capsule Take 100 mg by mouth every evening.    escitalopram  (LEXAPRO ) 10 MG tablet Take 1 tablet (10 mg total) by mouth at bedtime.    fluticasone  (FLONASE ) 50 MCG/ACT nasal spray Place 2 sprays into both nostrils daily as needed for allergies.    gabapentin  (NEURONTIN ) 300 MG capsule Take 300 mg by mouth 3 (three) times daily. 12/04/2021: Ilda Malkin only!   insulin  aspart (NOVOLOG ) 100 UNIT/ML injection Continuous with insulin  pump 12/04/2021: Averages about 26 units per day   irbesartan  (AVAPRO ) 300 MG tablet Take 300 mg by mouth daily after breakfast.  levocetirizine (XYZAL ) 5 MG tablet Take 5 mg by mouth daily.    levothyroxine  (SYNTHROID ) 100 MCG tablet Take 88 mcg by mouth daily before breakfast.    Magnesium  250 MG TABS Take 500 mg by mouth at bedtime.    methocarbamol  (ROBAXIN ) 500 MG tablet Take 1 tablet (500 mg total) by mouth every 6 (six) hours as needed for muscle spasms.    metoCLOPramide  (REGLAN ) 5 MG tablet Take 5 mg by mouth every 6 (six) hours as needed for nausea.    Multiple Vitamins-Minerals (AIRBORNE PO) Take 2 tablets by mouth daily.    ondansetron  (ZOFRAN -ODT) 8 MG disintegrating tablet Take 1 tablet (8 mg total) by mouth every 8 (eight) hours as needed for nausea or vomiting.    ONETOUCH ULTRA test strip 1 each by Other route 3 (three) times daily.    Polyvinyl Alcohol -Povidone (REFRESH OP) Place 1 drop into both eyes in the morning and at bedtime.    potassium gluconate 595 (99 K) MG TABS tablet Take 595 mg by mouth daily after breakfast.    Probiotic Product (FORTIFY DAILY PROBIOTIC) CAPS Take 1 capsule by mouth daily.    tirzepatide (MOUNJARO) 5 MG/0.5ML Pen Inject 5 mg into the skin once a week. 08/02/2022:  Take on Wednesdays per patient    traMADol  (ULTRAM ) 50 MG tablet Take 1-2 tablets (50-100 mg total) by mouth every 6 (six) hours as needed. 08/02/2022: Still has on hand   vitamin C (ASCORBIC ACID ) 250 MG tablet Take 500 mg by mouth 2 (two) times daily.    No facility-administered encounter medications on file as of 08/06/2023.     Follow-Up   No follow-ups on file.Aaron Aas She was informed of the importance of frequent follow up visits to maximize her success with intensive lifestyle modifications for her multiple health conditions.  Attestation Statement   Reviewed by clinician on day of visit: allergies, medications, problem list, medical history, surgical history, family history, social history, and previous encounter notes.     Ladd Picker, MD

## 2023-09-28 ENCOUNTER — Inpatient Hospital Stay

## 2023-09-28 ENCOUNTER — Other Ambulatory Visit: Payer: Self-pay

## 2023-09-28 ENCOUNTER — Inpatient Hospital Stay: Attending: Nurse Practitioner | Admitting: Nurse Practitioner

## 2023-09-28 LAB — CMP (CANCER CENTER ONLY)
ALT: 35 U/L (ref 0–44)
AST: 40 U/L (ref 15–41)
Albumin: 4.3 g/dL (ref 3.5–5.0)
Alkaline Phosphatase: 105 U/L (ref 38–126)
Anion gap: 5 (ref 5–15)
BUN: 16 mg/dL (ref 8–23)
CO2: 32 mmol/L (ref 22–32)
Calcium: 9.9 mg/dL (ref 8.9–10.3)
Chloride: 103 mmol/L (ref 98–111)
Creatinine: 0.79 mg/dL (ref 0.44–1.00)
GFR, Estimated: >60 mL/min (ref >60)
Glucose, Bld: 115 mg/dL — ABNORMAL HIGH (ref 70–99)
Potassium: 4.1 mmol/L (ref 3.5–5.1)
Sodium: 140 mmol/L (ref 135–145)
Total Bilirubin: 0.6 mg/dL (ref 0.0–1.2)
Total Protein: 6.9 g/dL (ref 6.5–8.1)

## 2023-09-28 LAB — CBC WITH DIFFERENTIAL (CANCER CENTER ONLY)
Abs Immature Granulocytes: 0.01 K/uL (ref 0.00–0.07)
Basophils Absolute: 0 K/uL (ref 0.0–0.1)
Basophils Relative: 0 %
Eosinophils Absolute: 0.1 K/uL (ref 0.0–0.5)
Eosinophils Relative: 3 %
HCT: 35.2 % — ABNORMAL LOW (ref 36.0–46.0)
Hemoglobin: 11.8 g/dL — ABNORMAL LOW (ref 12.0–15.0)
Immature Granulocytes: 0 %
Lymphocytes Relative: 32 %
Lymphs Abs: 1.6 K/uL (ref 0.7–4.0)
MCH: 32.1 pg (ref 26.0–34.0)
MCHC: 33.5 g/dL (ref 30.0–36.0)
MCV: 95.7 fL (ref 80.0–100.0)
Monocytes Absolute: 0.5 K/uL (ref 0.1–1.0)
Monocytes Relative: 9 %
Neutro Abs: 2.7 K/uL (ref 1.7–7.7)
Neutrophils Relative %: 56 %
Platelet Count: 217 K/uL (ref 150–400)
RBC: 3.68 MIL/uL — ABNORMAL LOW (ref 3.87–5.11)
RDW: 11.9 % (ref 11.5–15.5)
WBC Count: 4.9 K/uL (ref 4.0–10.5)
nRBC: 0 % (ref 0.0–0.2)

## 2023-09-28 LAB — SEDIMENTATION RATE: Sed Rate: 7 mm/h (ref 0–22)

## 2023-09-28 LAB — IRON AND IRON BINDING CAPACITY (CC-WL,HP ONLY)
Iron: 112 ug/dL (ref 28–170)
Saturation Ratios: 41 % — ABNORMAL HIGH (ref 10.4–31.8)
TIBC: 274 ug/dL (ref 250–450)
UIBC: 162 ug/dL (ref 148–442)

## 2023-09-28 NOTE — Progress Notes (Cosign Needed)
 [facility]  [phone]   HEMATOLOGY ONCOLOGYCONSULTATION   Colleen Zuniga  DOB: 16-Feb-1961  MR#: [id number]  CSN#: [id number]     Patient Care Team: Janey Santos, MD as PCP - General (Internal Medicine)  Reason for consult:  hereditary hemachromatosis  History of present illness:  The patient has medical history of type 1 diabetes, hypothyroid, osteoarthritis, degenerative disc disease, and generalized anxiety disorder. She had been, historically, iron deficient. She had taken oral iron supplements most of her life. About a year ago, her iron levels normalized and she came off the oral iron supplement. She had routine labs done in 06/2023 showing iron overload. At that time, her Hgb and Hct were normal at 12.2 and 36.2 respectively. Her ferritin was 281. Her iron was 156, TIBC 233, transferrin 77, and iron saturation of 67%. Her labs were rechecked in 08/2023.  At that time, her iron was 126, TIBC 243, transferrin 117, and saturation of 52%.  Her ferritin was 268.  Her most recent CMP was done on 02/13/2023.  Her BUN was 16, creatinine 0.7, EGFR was 84.2.  LFTs were normal.  AST 31, ALT 31, and alkaline phosphatase 96. She had a hospitalization in May 2024 due to pyelonephritis.  At the time, she was screened for hepatitis B and C and HIV.  Both hepatitis B and C were negative.  HIV was also negative.  She had an ultrasound of RUQ of the abdomen.  It was normal.  She also underwent CT abdomen pelvis with contrast during hospitalization.  She was found to have a hemangioma in the lateral aspect of left hepatic lobe approximately 1.2 cm in diameter.  There were no other suspicious liver lesions identified.  The spleen was unremarkable. Today, the patient presents asymptomatic.  She denies fevers, chills, night sweats, or unintentional weight loss.  She has lost approximately 60 pounds over the last year.  Mounjaro was added to her regular medications help control blood  sugars.  This helped her with weight loss in addition to diet and exercise.  She denies chest pain, chest pressure, or shortness of breath. She denies headaches or visual disturbances. She denies abdominal pain, nausea, vomiting, or changes in bowel or bladder habits.  She does have generalized joint pain, but this is not unusual for her.  She has osteoarthritis, degenerative disc disease, and scoliosis.  She has had multiple spinal fusions of cervical spine and lumbar spine.  She has also had bilateral total knee replacements.  She is currently in physical therapy and has dry needling done to cervical disks.  Her surgeon is trying to avoid the need for additional cervical spinal fusion.  Patient reports no history of autoimmune inflammatory arthritis such as lupus, psoriatic arthritis, or rheumatoid arthritis. Socially, the patient is married and lives with her husband in [place], [place].  She is the primary caretaker for her husband who has Alzheimer's disease.  She also helps to care for her mother who is 61 years old.  Her mother continues to live alone.  She, along with other family members, help take care of her mother.  She has no history of smoking.  She does not drink alcohol .  And she does not use illegal or illicit drugs.  Her sister had breast cancer and ultimately passed away from this.  Her maternal uncle has hereditary hemochromatosis.  There is no other family history of cancer or blood disorders in her personal or family history.  MEDICAL HISTORY:  Past Medical History:  Diagnosis Date   Anemia    Anxiety    Arthritis    Back pain    Back pain    De Quervain's tenosynovitis, right 01/2012   Degenerative disc disease, cervical    states neck is stiff, reduced range of motion   Dental crowns present    Depression    Diabetes mellitus    Insulin  pump   GERD (gastroesophageal reflux disease)    Heart murmur    states has a functional murmur, and that she has never had any problems    Hypothyroidism    Joint pain    Osteoarthritis    PONV (postoperative nausea and vomiting)     SURGICAL HISTORY: Past Surgical History:  Procedure Laterality Date   ANTERIOR CERVICAL DISCECTOMY  02/20/2016   ANTERIOR LAT LUMBAR FUSION N/A 08/18/2016   Procedure: ANTERIOR LATERAL LUMBAR FUSION LUMBAR TWO- LUMBAR THREE, LUMBAR THREE- LUMBAR FOUR, LUMBAR FOUR- LUMBAR FIVE; LUMBAR TWO- LUMBAR FIVE PEDICLE SCREW FIXATION;  Surgeon: Alix Charleston, MD;  Location: [facility] OR;  Service: Neurosurgery;  Laterality: N/A;  ANTERIOR LATERAL LUMBAR FUSION LUMBAR 2- LUMBAR 3, LUMBAR 3- LUMBAR 4-, LUMBAR 4- LUMBAR 5; LUMBAR 2- LUMBAR 5 PEDICLE SCREW FIXATION   ANTERIOR LAT LUMBAR FUSION N/A 08/05/2019   Procedure: ANTERIOR LATERAL LUMBAR INTERBODY FUSION LUMBAR ONE-TWO.;  Surgeon: Onetha Kuba, MD;  Location: [facility] OR;  Service: Neurosurgery;  Laterality: N/A;  anterolateral   APPLICATION OF INTRAOPERATIVE CT SCAN N/A 08/05/2019   Procedure: APPLICATION OF INTRAOPERATIVE CT SCAN;  Surgeon: Onetha Kuba, MD;  Location: [facility] OR;  Service: Neurosurgery;  Laterality: N/A;   BACK SURGERY  07/2018   plif l5-s1   Cataracts Bilateral    CESAREAN SECTION     DORSAL COMPARTMENT RELEASE  02/08/2002   first dorsal compartment left wrist   DORSAL COMPARTMENT RELEASE  02/03/2012   Procedure: RELEASE DORSAL COMPARTMENT (DEQUERVAIN);  Surgeon: Kuba JONELLE Curia, MD;  Location: [facility];  Service: Orthopedics;  Laterality: Right;  RELEASE DEQUERVAINS RIGHT WRIST   KNEE ARTHROSCOPY  10/14/2004   right   LAMINECTOMY WITH POSTERIOR LATERAL ARTHRODESIS LEVEL 1 N/A 08/05/2019   Procedure: Posterior lateral fusion - Lumbar Five-Sacral One with pelvic fixation with iliac screws and Nuvasive instrumentation;  Surgeon: Onetha Kuba, MD;  Location: [facility] OR;  Service: Neurosurgery;  Laterality: N/A;  posterior   SHOULDER ARTHROSCOPY  05/05/2005   left   TOTAL KNEE ARTHROPLASTY Left 12/23/2021   Procedure: TOTAL KNEE ARTHROPLASTY;  Surgeon:  Melodi Lerner, MD;  Location: [facility] ORS;  Service: Orthopedics;  Laterality: Left;   TOTAL KNEE ARTHROPLASTY Right 03/31/2022   Procedure: TOTAL KNEE ARTHROPLASTY;  Surgeon: Melodi Lerner, MD;  Location: [facility];  Service: Orthopedics;  Laterality: Right;   TRIGGER FINGER RELEASE  01/07/2011   release A1 pulley left thumb   TRIGGER FINGER RELEASE     x 5 other fingers    SOCIAL HISTORY: Social History   Socioeconomic History   Marital status: Married    Spouse name: Lunna Vogelgesang   Number of children: Not on file   Years of education: Not on file   Highest education level: Not on file  Occupational History   Occupation: [employer]  Tobacco Use   Smoking status: Former    Current packs/day: 0.00    Types: Cigarettes    Quit date: 03/11/1991    Years since quitting: 32.5   Smokeless tobacco: Never  Vaping Use   Vaping status: Never Used  Substance and Sexual Activity   Alcohol  use: Not Currently    Comment: rarely   Drug use: No   Sexual activity: Not Currently    Birth control/protection: Post-menopausal  Other Topics Concern   Not on file  Social History Narrative   Not on file   Social Drivers of Health   Financial Resource Strain: Not on file  Food Insecurity: No Food Insecurity (09/28/2023)   Hunger Vital Sign    Worried About Running Out of Food in the Last Year: Never true    Ran Out of Food in the Last Year: Never true  Transportation Needs: No Transportation Needs (09/28/2023)   PRAPARE - Administrator, Civil Service (Medical): No    Lack of Transportation (Non-Medical): No  Physical Activity: Not on file  Stress: Not on file  Social Connections: Not on file  Intimate Partner Violence: Not At Risk (09/28/2023)   Humiliation, Afraid, Rape, and Kick questionnaire    Fear of Current or Ex-Partner: No    Emotionally Abused: No    Physically Abused: No    Sexually Abused: No    FAMILY HISTORY: Family History  Problem Relation Age of Onset   Heart  disease Mother    Stroke Father    Diabetes Father    Heart disease Father    Kidney disease Father    Obesity Father    Colon cancer Neg Hx    Esophageal cancer Neg Hx    Rectal cancer Neg Hx    Stomach cancer Neg Hx     ALLERGIES:  is allergic to penicillins, cefdinir, hydrocodone-acetaminophen , oxycodone  hcl, and adhesive [tape].  MEDICATIONS:  Current Outpatient Medications  Medication Sig Dispense Refill   albuterol  (VENTOLIN  HFA) 108 (90 Base) MCG/ACT inhaler Inhale 2 puffs into the lungs every 4 (four) hours as needed for shortness of breath or wheezing.     ALPRAZolam  (XANAX ) 0.5 MG tablet Take 0.5 mg by mouth every 6 (six) hours as needed for sleep or anxiety.  3   atorvastatin  (LIPITOR) 10 MG tablet Take 10 mg by mouth daily after supper.     BAQSIMI  TWO PACK 3 MG/DOSE POWD Place 3 mg into the nose as needed (hypoglycemia).     buPROPion  (WELLBUTRIN  XL) 300 MG 24 hr tablet Take 300 mg by mouth daily after breakfast.     Calcium  Carb-Cholecalciferol (CALCIUM  500 + D PO) Take 2 tablets by mouth 2 (two) times daily.     Continuous Glucose Monitor DEVI 1 Device by Other route continuous.     docusate sodium  (COLACE) 100 MG capsule Take 100 mg by mouth every evening.     escitalopram  (LEXAPRO ) 10 MG tablet Take 1 tablet (10 mg total) by mouth at bedtime. 90 tablet 0   fluticasone  (FLONASE ) 50 MCG/ACT nasal spray Place 2 sprays into both nostrils daily as needed for allergies.     gabapentin  (NEURONTIN ) 300 MG capsule Take 300 mg by mouth 3 (three) times daily.     insulin  aspart (NOVOLOG ) 100 UNIT/ML injection Continuous with insulin  pump     irbesartan  (AVAPRO ) 300 MG tablet Take 300 mg by mouth daily after breakfast.     levocetirizine (XYZAL ) 5 MG tablet Take 5 mg by mouth daily.     levothyroxine  (SYNTHROID ) 100 MCG tablet Take 88 mcg by mouth daily before breakfast.     Magnesium  250 MG TABS Take 500 mg by mouth at bedtime.     methocarbamol  (ROBAXIN ) 500 MG tablet Take  1  tablet (500 mg total) by mouth every 6 (six) hours as needed for muscle spasms. 40 tablet 0   metoCLOPramide  (REGLAN ) 5 MG tablet Take 5 mg by mouth every 6 (six) hours as needed for nausea.     Multiple Vitamins-Minerals (AIRBORNE PO) Take 2 tablets by mouth daily.     ondansetron  (ZOFRAN -ODT) 8 MG disintegrating tablet Take 1 tablet (8 mg total) by mouth every 8 (eight) hours as needed for nausea or vomiting. 20 tablet 0   ONETOUCH ULTRA test strip 1 each by Other route 3 (three) times daily.     Polyvinyl Alcohol -Povidone (REFRESH OP) Place 1 drop into both eyes in the morning and at bedtime.     potassium gluconate 595 (99 K) MG TABS tablet Take 595 mg by mouth daily after breakfast.     Probiotic Product (FORTIFY DAILY PROBIOTIC) CAPS Take 1 capsule by mouth daily.     tirzepatide (MOUNJARO) 5 MG/0.5ML Pen Inject 5 mg into the skin once a week.     traMADol  (ULTRAM ) 50 MG tablet Take 1-2 tablets (50-100 mg total) by mouth every 6 (six) hours as needed. 40 tablet 0   vitamin C (ASCORBIC ACID ) 250 MG tablet Take 500 mg by mouth 2 (two) times daily.     No current facility-administered medications for this visit.    REVIEW OF SYSTEMS:   Constitutional: Denies fevers, chills or abnormal night sweats Eyes: Denies blurriness of vision, double vision or watery eyes Ears, nose, mouth, throat, and face: Denies mucositis or sore throat Respiratory: Denies cough, dyspnea or wheezes Cardiovascular: Denies palpitation, chest discomfort or lower extremity swelling Gastrointestinal:  Denies nausea, heartburn or change in bowel habits Skin: Denies abnormal skin rashes Lymphatics: Denies new lymphadenopathy or easy bruising Neurological:Denies numbness, tingling or new weaknesses Behavioral/Psych: Mood is stable, no new changes  All other systems were reviewed with the patient and are negative.  PHYSICAL EXAMINATION: ECOG PERFORMANCE STATUS: 0 - Asymptomatic  Vitals:   09/28/23 1458  BP: (!)  130/56  Pulse: 71  Resp: 16  Temp: (!) 97.5 F (36.4 C)  SpO2: 99%   Filed Weights   09/28/23 1458  Weight: 135 lb 1.6 oz (61.3 kg)    GENERAL:alert, no distress and comfortable SKIN: skin color, texture, turgor are normal, no rashes or significant lesions EYES: normal, conjunctiva are pink and non-injected, sclera clear OROPHARYNX:no exudate, no erythema and lips, buccal mucosa, and tongue normal  NECK: supple, thyroid normal size, non-tender, without nodularity LYMPH:  no palpable lymphadenopathy in the cervical, axillary or inguinal LUNGS: clear to auscultation and percussion with normal breathing effort HEART: regular rate & rhythm and no murmurs and no lower extremity edema ABDOMEN:abdomen soft, non-tender and normal bowel sounds Musculoskeletal:no cyanosis of digits and no clubbing  PSYCH: alert & oriented x 3 with fluent speech NEURO: no focal motor/sensory deficits  LABORATORY DATA:  I have reviewed the data as listed Lab Results  Component Value Date   WBC 4.9 09/28/2023   HGB 11.8 (L) 09/28/2023   HCT 35.2 (L) 09/28/2023   MCV 95.7 09/28/2023   PLT 217 09/28/2023      Latest Ref Rng & Units 09/28/2023    4:14 PM 08/06/2022   11:46 PM 08/05/2022    3:50 AM  CMP  Glucose 70 - 99 mg/dL 884  853  864   BUN 8 - 23 mg/dL 16  10  10    Creatinine 0.44 - 1.00 mg/dL 9.20  9.02  9.11  Sodium 135 - 145 mmol/L 140  137  135   Potassium 3.5 - 5.1 mmol/L 4.1  4.1  4.7   Chloride 98 - 111 mmol/L 103  104  105   CO2 22 - 32 mmol/L 32  25  23   Calcium  8.9 - 10.3 mg/dL 9.9  9.3  9.0   Total Protein 6.5 - 8.1 g/dL 6.9  6.2  5.8   Total Bilirubin 0.0 - 1.2 mg/dL 0.6  0.6  0.3   Alkaline Phos 38 - 126 U/L 105  240  197   AST 15 - 41 U/L 40  79  73   ALT 0 - 44 U/L 35  88  75      ASSESSMENT & PLAN:  Hemochromatosis, unspecified hemochromatosis type Assessment & Plan: The patient has medical history of type 1 diabetes, hypothyroid, osteoarthritis, degenerative disc  disease, and generalized anxiety disorder. She had been, historically, iron deficient. She had taken oral iron supplements most of her life. About a year ago, her iron levels normalized and she came off the oral iron supplement. She had routine labs done in 06/2023 showing iron overload. At that time, her Hgb and Hct were normal at 12.2 and 36.2 respectively. Her ferritin was 281. Her iron was 156, TIBC 233, transferrin 77, and iron saturation of 67%. Her labs were rechecked in 08/2023.  At that time, her iron was 126, TIBC 243, transferrin 117, and saturation of 52%.  Her ferritin was 268.  Her most recent CMP was done on 02/13/2023.  Her BUN was 16, creatinine 0.7, EGFR was 84.2.  LFTs were normal.  AST 31, ALT 31, and alkaline phosphatase 96. She had a hospitalization in May 2024 due to pyelonephritis.  At the time, she was screened for hepatitis B and C and HIV.  Both hepatitis B and C were negative.  HIV was also negative.  She had an ultrasound of RUQ of the abdomen.  It was normal.  She also underwent CT abdomen pelvis with contrast during hospitalization.  She was found to have a hemangioma in the lateral aspect of left hepatic lobe approximately 1.2 cm in diameter.  There were no other suspicious liver lesions identified.  The spleen was unremarkable. We discussed multiple causes of iron overload.  Hereditary hemochromatosis is suspected since she does have a close family member with the same condition.  Will check HFE gene profile to determine if her condition is genetically related.  Common causes also include infection, chronic inflammation, too much oral iron intake, and liver injury.  We will repeat CBC, iron studies, ferritin, CMP, and sed rate for further evaluation.  We discussed treatment, if necessary, would most likely be therapeutic phlebotomy.  Without diagnosis of hereditary hemochromatosis, she could donate blood in order to keep iron levels down.  She has historically been told she was unable  to donate blood due to type 1 diabetes.  If necessary, will provide her with therapeutic phlebotomy.  Most recently, Hgb and HCT have been very close to normal and ferritin has been mildly elevated.  She is also asymptomatic.  We would continue to monitor her blood work every 3 to 6 months and determine necessity for therapeutic phlebotomy.  Will contact patient with lab results once they are available and determine surveillance schedule at that time.  She voiced understanding and agreement with this plan.  Orders: -     Iron and Iron Binding Capacity ([facility] only); Future -  CBC with Differential (Cancer Center Only); Future -     CMP (Cancer Center only); Future -     Ferritin; Future -     HFE-Associated Hereditary Hemochromatosis (COHESION); Future -     Sedimentation rate; Future    The patient was seen along with Dr. Lanny today. Time spent with the patient was approximately 30 minutes. This time included reviewing progress notes, labs, imaging studies, and discussing plan for follow up. All questions were answered. The patient knows to call the clinic with any problems, questions or concerns.      Powell FORBES Lessen, NP 09/28/2023 5:09 PM  Addendum I have seen the patient, examined her. I agree with the assessment and and plan and have edited the notes.   63 year old female with family history of hemochromatosis, was referred for elevated ferritin and rule out hereditary hemochromatosis.  She has been on oral iron for many years, but stopped a year ago when her iron level was normal.  The ferritin was 21 in 01/2019, which is against hemochromatosis.  Her slightly elevated ferritin (in high 200's) is likely related to her oral iron supplement, or her infection last year.  I will repeat her iron studies today, and also obtain genetic testing to rule out hereditary hemochromatosis.  We discussed low iron diet, unfortunately she is not able to donate blood due to her type 1 diabetes.  Will  call her with lab results.  All questions were answered.  I spent a total of 30 minutes for her visit today.  Onita Lanny MD 09/28/2023

## 2023-09-28 NOTE — Assessment & Plan Note (Signed)
 The patient has medical history of type 1 diabetes, hypothyroid, osteoarthritis, degenerative disc disease, and generalized anxiety disorder. She had been, historically, iron deficient. She had taken oral iron supplements most of her life. About a year ago, her iron levels normalized and she came off the oral iron supplement. She had routine labs done in 06/2023 showing iron overload. At that time, her Hgb and Hct were normal at 12.2 and 36.2 respectively. Her ferritin was 281. Her iron was 156, TIBC 233, transferrin 77, and iron saturation of 67%. Her labs were rechecked in 08/2023.  At that time, her iron was 126, TIBC 243, transferrin 117, and saturation of 52%.  Her ferritin was 268.  Her most recent CMP was done on 02/13/2023.  Her BUN was 16, creatinine 0.7, EGFR was 84.2.  LFTs were normal.  AST 31, ALT 31, and alkaline phosphatase 96. She had a hospitalization in May 2024 due to pyelonephritis.  At the time, she was screened for hepatitis B and C and HIV.  Both hepatitis B and C were negative.  HIV was also negative.  She had an ultrasound of RUQ of the abdomen.  It was normal.  She also underwent CT abdomen pelvis with contrast during hospitalization.  She was found to have a hemangioma in the lateral aspect of left hepatic lobe approximately 1.2 cm in diameter.  There were no other suspicious liver lesions identified.  The spleen was unremarkable. We discussed multiple causes of iron overload.  Hereditary hemochromatosis is suspected since she does have a close family member with the same condition.  Will check HFE gene profile to determine if her condition is genetically related.  Common causes also include infection, chronic inflammation, too much oral iron intake, and liver injury.  We will repeat CBC, iron studies, ferritin, CMP, and sed rate for further evaluation.  We discussed treatment, if necessary, would most likely be therapeutic phlebotomy.  Without diagnosis of hereditary hemochromatosis, she  could donate blood in order to keep iron levels down.  She has historically been told she was unable to donate blood due to type 1 diabetes.  If necessary, will provide her with therapeutic phlebotomy.  Most recently, Hgb and HCT have been very close to normal and ferritin has been mildly elevated.  She is also asymptomatic.  We would continue to monitor her blood work every 3 to 6 months and determine necessity for therapeutic phlebotomy.  Will contact patient with lab results once they are available and determine surveillance schedule at that time.  She voiced understanding and agreement with this plan.

## 2023-09-29 ENCOUNTER — Ambulatory Visit: Payer: Self-pay | Admitting: Nurse Practitioner

## 2023-09-29 LAB — FERRITIN: Ferritin: 279 ng/mL (ref 11–307)

## 2023-10-01 LAB — HEMOCHROMATOSIS DNA-PCR(C282Y,H63D)

## 2023-10-06 ENCOUNTER — Telehealth: Payer: Self-pay | Admitting: Nurse Practitioner

## 2023-10-06 NOTE — Telephone Encounter (Signed)
 Scheduled appointments per staff message from Dr.Feng. Talked with the patient and she is aware of the made appointment.

## 2023-10-17 ENCOUNTER — Other Ambulatory Visit (INDEPENDENT_AMBULATORY_CARE_PROVIDER_SITE_OTHER): Payer: Self-pay | Admitting: Internal Medicine

## 2023-10-17 DIAGNOSIS — Z636 Dependent relative needing care at home: Secondary | ICD-10-CM

## 2023-10-20 ENCOUNTER — Inpatient Hospital Stay: Attending: Nurse Practitioner | Admitting: Nurse Practitioner

## 2023-10-20 ENCOUNTER — Inpatient Hospital Stay

## 2023-10-20 LAB — RETICULOCYTES
Immature Retic Fract: 4.1 % (ref 2.3–15.9)
RBC.: 3.67 MIL/uL — ABNORMAL LOW (ref 3.87–5.11)
Retic Count, Absolute: 43.3 K/uL (ref 19.0–186.0)
Retic Ct Pct: 1.2 % (ref 0.4–3.1)

## 2023-10-20 LAB — CMP (CANCER CENTER ONLY)
ALT: 34 U/L (ref 0–44)
AST: 40 U/L (ref 15–41)
Albumin: 4.3 g/dL (ref 3.5–5.0)
Alkaline Phosphatase: 108 U/L (ref 38–126)
Anion gap: 5 (ref 5–15)
BUN: 19 mg/dL (ref 8–23)
CO2: 32 mmol/L (ref 22–32)
Calcium: 9.6 mg/dL (ref 8.9–10.3)
Chloride: 101 mmol/L (ref 98–111)
Creatinine: 0.84 mg/dL (ref 0.44–1.00)
GFR, Estimated: >60 mL/min (ref >60)
Glucose, Bld: 116 mg/dL — ABNORMAL HIGH (ref 70–99)
Potassium: 4.4 mmol/L (ref 3.5–5.1)
Sodium: 138 mmol/L (ref 135–145)
Total Bilirubin: 0.6 mg/dL (ref 0.0–1.2)
Total Protein: 6.7 g/dL (ref 6.5–8.1)

## 2023-10-20 LAB — CBC WITH DIFFERENTIAL (CANCER CENTER ONLY)
Abs Immature Granulocytes: 0.01 K/uL (ref 0.00–0.07)
Basophils Absolute: 0 K/uL (ref 0.0–0.1)
Basophils Relative: 1 %
Eosinophils Absolute: 0.2 K/uL (ref 0.0–0.5)
Eosinophils Relative: 4 %
HCT: 35.1 % — ABNORMAL LOW (ref 36.0–46.0)
Hemoglobin: 11.7 g/dL — ABNORMAL LOW (ref 12.0–15.0)
Immature Granulocytes: 0 %
Lymphocytes Relative: 36 %
Lymphs Abs: 1.5 K/uL (ref 0.7–4.0)
MCH: 31.9 pg (ref 26.0–34.0)
MCHC: 33.3 g/dL (ref 30.0–36.0)
MCV: 95.6 fL (ref 80.0–100.0)
Monocytes Absolute: 0.4 K/uL (ref 0.1–1.0)
Monocytes Relative: 10 %
Neutro Abs: 2 K/uL (ref 1.7–7.7)
Neutrophils Relative %: 49 %
Platelet Count: 201 K/uL (ref 150–400)
RBC: 3.67 MIL/uL — ABNORMAL LOW (ref 3.87–5.11)
RDW: 11.6 % (ref 11.5–15.5)
WBC Count: 4.1 K/uL (ref 4.0–10.5)
nRBC: 0 % (ref 0.0–0.2)

## 2023-10-20 LAB — IRON AND IRON BINDING CAPACITY (CC-WL,HP ONLY)
Iron: 144 ug/dL (ref 28–170)
Saturation Ratios: 55 % — ABNORMAL HIGH (ref 10.4–31.8)
TIBC: 263 ug/dL (ref 250–450)
UIBC: 119 ug/dL — ABNORMAL LOW (ref 148–442)

## 2023-10-20 LAB — FOLATE: Folate: 27 ng/mL (ref >5.9)

## 2023-10-20 LAB — LACTATE DEHYDROGENASE: LDH: 193 U/L — ABNORMAL HIGH (ref 98–192)

## 2023-10-20 LAB — FERRITIN: Ferritin: 264 ng/mL (ref 11–307)

## 2023-10-20 LAB — VITAMIN B12: Vitamin B-12: 136 pg/mL — ABNORMAL LOW (ref 180–914)

## 2023-10-20 NOTE — Progress Notes (Cosign Needed)
 Patient Care Team: Avva, Ravisankar, MD as PCP - General (Internal Medicine)  Clinic Day:  10/20/2023  Referring physician: Avva, Ravisankar, MD  ASSESSMENT & PLAN:   Assessment & Plan: Hemochromatosis The patient has medical history of type 1 diabetes, hypothyroid, osteoarthritis, degenerative disc disease, and generalized anxiety disorder. She had been, historically, iron deficient. She had taken oral iron supplements most of her life. About a year ago, her iron levels normalized and she came off the oral iron supplement. She had routine labs done in 06/2023 showing iron overload. At that time, her Hgb and Hct were normal at 12.2 and 36.2 respectively. Her ferritin was 281. Her iron was 156, TIBC 233, transferrin 77, and iron saturation of 67%. Her labs were rechecked in 08/2023.  At that time, her iron was 126, TIBC 243, transferrin 117, and saturation of 52%.  Her ferritin was 268.  Her most recent CMP was done on 02/13/2023.  Her BUN was 16, creatinine 0.7, EGFR was 84.2.  LFTs were normal.  AST 31, ALT 31, and alkaline phosphatase 96. She had a hospitalization in May 2024 due to pyelonephritis.  At the time, she was screened for hepatitis B and C and HIV.  Both hepatitis B and C were negative.  HIV was also negative.  She had an ultrasound of RUQ of the abdomen.  It was normal.  She also underwent CT abdomen pelvis with contrast during hospitalization.  She was found to have a hemangioma in the lateral aspect of left hepatic lobe approximately 1.2 cm in diameter.  There were no other suspicious liver lesions identified.  The spleen was unremarkable. We discussed multiple causes of iron overload.  Hereditary hemochromatosis is suspected since she does have a close family member with the same condition.  Will check HFE gene profile to determine if her condition is genetically related.  Common causes also include infection, chronic inflammation, too much oral iron intake, and liver injury.  We will  repeat CBC, iron studies, ferritin, CMP, and sed rate for further evaluation.  We discussed treatment, if necessary, would most likely be therapeutic phlebotomy.  Without diagnosis of hereditary hemochromatosis, she could donate blood in order to keep iron levels down.  She has historically been told she was unable to donate blood due to type 1 diabetes.  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 She is also asymptomatic  It will be necessary to provide her with therapeutic phlebotomy.  Most recently, Hgb was 279. We will plan for therapeutic phlebotomy in the next week. We would continue to monitor her blood work every 3 to 6 months and determine necessity for regular treatments of therapeutic phlebotomy.     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  We discussed therapeutic phlebotomy as the safest and most effective way to lower blood counts.  Goal would be to keep ferritin <50.  Most recent ferritin level was 279.  Will recheck today along with B12, folate, reticulocyte count, and LDH.  Will contact patient with results and set up treatments with therapeutic phlebotomy as indicated.  Plan The patient was seen along with Dr. Lanny today.  Recent lab results showing that she is positive iron.  Hemochromatosis. Check labs today including B12, folate, reticulocytes, LDH, ferritin, and CBC. Discussed therapeutic phlebotomy as safest and most effective way to lower blood counts. -Goal is for ferritin <50.  Will contact patient with results  and schedule for treatments. Once treatment started, will check labs every 3 months with follow-up in 6 months.  The patient understands the plans discussed today and is in agreement with them.  She knows to contact our office if she develops concerns prior to her next appointment.  I provided 25 minutes of  face-to-face time during this encounter and > 50% was spent counseling as documented under my assessment and plan.    Colleen FORBES Lessen, NP  [facility] - A DEPT OF [facility] [address] [place] [address] Dept: [phone] Dept Fax: [phone]   Orders Placed This Encounter  Procedures   Vitamin B12    Standing Status:   Future    Number of Occurrences:   1    Expected Date:   10/20/2023    Expiration Date:   01/18/2024   Folate    Standing Status:   Future    Number of Occurrences:   1    Expected Date:   10/20/2023    Expiration Date:   01/18/2024   CMP (Cancer Center only)    Standing Status:   Future    Number of Occurrences:   1    Expected Date:   10/20/2023    Expiration Date:   01/18/2024   Reticulocytes    Standing Status:   Future    Number of Occurrences:   1    Expected Date:   10/20/2023    Expiration Date:   01/18/2024   Iron and Iron Binding Capacity ([facility] only)    Standing Status:   Future    Number of Occurrences:   1    Expected Date:   10/20/2023    Expiration Date:   01/18/2024   Ferritin    Standing Status:   Future    Number of Occurrences:   1    Expected Date:   10/20/2023    Expiration Date:   01/18/2024   CBC with Differential (Cancer Center Only)    Standing Status:   Future    Number of Occurrences:   1    Expected Date:   10/20/2023    Expiration Date:   01/18/2024   Lactate dehydrogenase (LDH)    Standing Status:   Future    Number of Occurrences:   1    Expected Date:   10/20/2023    Expiration Date:   01/18/2024   ECHOCARDIOGRAM COMPLETE    Standing Status:   Future    Expected Date:   11/20/2023    Expiration Date:   10/19/2024    Where should this test be performed:   [facility]    Perflutren DEFINITY (image enhancing agent) should be administered unless hypersensitivity or allergy exist:   Administer Perflutren    Reason for exam-Echo:   Dyspnea  R06.00       CHIEF COMPLAINT:  CC: Hereditary hemochromatosis  Current Treatment: Pending therapeutic phlebotomy  INTERVAL HISTORY:  Colleen Zuniga is here today for repeat clinical assessment.  She was last seen in the cancer center on 09/28/2023 with concerns of hereditary hemochromatosis.  Lab work done confirmed diagnosis of homozygous hereditary hemochromatosis.  Ferritin 279.  She continues to be asymptomatic she denies chest pain, chest pressure, or shortness of breath. She denies headaches or visual disturbances. She denies abdominal pain, nausea, vomiting, or changes in bowel or bladder habits.  She denies fevers or chills. She denies pain.  Her appetite is good. Her weight has been stable.  I have reviewed the past medical history, past surgical history, social history and family history with the patient and they are unchanged from previous note.  ALLERGIES:  is allergic to penicillins, cefdinir, hydrocodone-acetaminophen , oxycodone  hcl, and adhesive [tape].  MEDICATIONS:  Current Outpatient Medications  Medication Sig Dispense Refill   albuterol  (VENTOLIN  HFA) 108 (90 Base) MCG/ACT inhaler Inhale 2 puffs into the lungs every 4 (four) hours as needed for shortness of breath or wheezing.     ALPRAZolam  (XANAX ) 0.5 MG tablet Take 0.5 mg by mouth every 6 (six) hours as needed for sleep or anxiety.  3   atorvastatin  (LIPITOR) 10 MG tablet Take 10 mg by mouth daily after supper.     BAQSIMI  TWO PACK 3 MG/DOSE POWD Place 3 mg into the nose as needed (hypoglycemia).     buPROPion  (WELLBUTRIN  XL) 300 MG 24 hr tablet Take 300 mg by mouth daily after breakfast.     Calcium  Carb-Cholecalciferol (CALCIUM  500 + D PO) Take 2 tablets by mouth 2 (two) times daily.     Continuous Glucose Monitor DEVI 1 Device by Other route continuous.     docusate sodium  (COLACE) 100 MG capsule Take 100 mg by mouth every evening.     escitalopram  (LEXAPRO ) 10 MG tablet Take 1 tablet (10 mg total) by mouth at bedtime. 90 tablet 0    fluticasone  (FLONASE ) 50 MCG/ACT nasal spray Place 2 sprays into both nostrils daily as needed for allergies.     gabapentin  (NEURONTIN ) 300 MG capsule Take 300 mg by mouth 3 (three) times daily.     insulin  aspart (NOVOLOG ) 100 UNIT/ML injection Continuous with insulin  pump     irbesartan  (AVAPRO ) 300 MG tablet Take 300 mg by mouth daily after breakfast.     levocetirizine (XYZAL ) 5 MG tablet Take 5 mg by mouth daily.     levothyroxine  (SYNTHROID ) 100 MCG tablet Take 88 mcg by mouth daily before breakfast.     Magnesium  250 MG TABS Take 500 mg by mouth at bedtime.     methocarbamol  (ROBAXIN ) 500 MG tablet Take 1 tablet (500 mg total) by mouth every 6 (six) hours as needed for muscle spasms. 40 tablet 0   metoCLOPramide  (REGLAN ) 5 MG tablet Take 5 mg by mouth every 6 (six) hours as needed for nausea.     Multiple Vitamins-Minerals (AIRBORNE PO) Take 2 tablets by mouth daily.     ondansetron  (ZOFRAN -ODT) 8 MG disintegrating tablet Take 1 tablet (8 mg total) by mouth every 8 (eight) hours as needed for nausea or vomiting. 20 tablet 0   ONETOUCH ULTRA test strip 1 each by Other route 3 (three) times daily.     Polyvinyl Alcohol -Povidone (REFRESH OP) Place 1 drop into both eyes in the morning and at bedtime.     potassium gluconate 595 (99 K) MG TABS tablet Take 595 mg by mouth daily after breakfast.     Probiotic Product (FORTIFY DAILY PROBIOTIC) CAPS Take 1 capsule by mouth daily.     tirzepatide (MOUNJARO) 5 MG/0.5ML Pen Inject 5 mg into the skin once a week.     traMADol  (ULTRAM ) 50 MG tablet Take 1-2 tablets (50-100 mg total) by mouth every 6 (six) hours as needed. 40 tablet 0   vitamin C (ASCORBIC ACID ) 250 MG tablet Take 500 mg by mouth 2 (two) times daily.     No current facility-administered medications for this visit.    REVIEW OF  SYSTEMS:   Constitutional: Denies fevers, chills or abnormal weight loss Eyes: Denies blurriness of vision Ears, nose, mouth, throat, and face: Denies  mucositis or sore throat Respiratory: Denies cough, dyspnea or wheezes Cardiovascular: Denies palpitation, chest discomfort or lower extremity swelling Gastrointestinal:  Denies nausea, heartburn or change in bowel habits Skin: Denies abnormal skin rashes Lymphatics: Denies new lymphadenopathy or easy bruising Neurological:Denies numbness, tingling or new weaknesses Behavioral/Psych: Mood is stable, no new changes  All other systems were reviewed with the patient and are negative.   VITALS:   Today's Vitals   10/20/23 0844 10/20/23 0902  BP: (!) 120/58   Pulse: 86   Resp: 17   Temp: 97.7 F (36.5 C)   SpO2: 97%   Weight: 135 lb 14.4 oz (61.6 kg)   PainSc:  0-No pain   Body mass index is 24.86 kg/m.   Wt Readings from Last 3 Encounters:  10/20/23 135 lb 14.4 oz (61.6 kg)  09/28/23 135 lb 1.6 oz (61.3 kg)  08/06/23 128 lb (58.1 kg)    Body mass index is 24.86 kg/m.  Performance status (ECOG): 0 - Asymptomatic  PHYSICAL EXAM:   GENERAL:alert, no distress and comfortable SKIN: skin color, texture, turgor are normal, no rashes or significant lesions EYES: normal, Conjunctiva are pink and non-injected, sclera clear OROPHARYNX:no exudate, no erythema and lips, buccal mucosa, and tongue normal  NECK: supple, thyroid normal size, non-tender, without nodularity LYMPH:  no palpable lymphadenopathy in the cervical, axillary or inguinal LUNGS: clear to auscultation and percussion with normal breathing effort HEART: regular rate & rhythm and no murmurs and no lower extremity edema ABDOMEN:abdomen soft, non-tender and normal bowel sounds Musculoskeletal:no cyanosis of digits and no clubbing  NEURO: alert & oriented x 3 with fluent speech, no focal motor/sensory deficits  LABORATORY DATA:  I have reviewed the data as listed    Component Value Date/Time   NA 138 10/20/2023 0934   NA 140 09/17/2021 1541   K 4.4 10/20/2023 0934   CL 101 10/20/2023 0934   CO2 32 10/20/2023 0934    GLUCOSE 116 (H) 10/20/2023 0934   BUN 19 10/20/2023 0934   BUN 20 09/17/2021 1541   CREATININE 0.84 10/20/2023 0934   CALCIUM  9.6 10/20/2023 0934   PROT 6.7 10/20/2023 0934   PROT 6.7 09/17/2021 1541   ALBUMIN  4.3 10/20/2023 0934   ALBUMIN  4.5 09/17/2021 1541   AST 40 10/20/2023 0934   ALT 34 10/20/2023 0934   ALKPHOS 108 10/20/2023 0934   BILITOT 0.6 10/20/2023 0934   GFRNONAA >60 10/20/2023 0934   GFRAA >60 08/08/2019 0920     Lab Results  Component Value Date   WBC 4.1 10/20/2023   NEUTROABS 2.0 10/20/2023   HGB 11.7 (L) 10/20/2023   HCT 35.1 (L) 10/20/2023   MCV 95.6 10/20/2023   PLT 201 10/20/2023    Iron/TIBC/Ferritin/ %Sat    Component Value Date/Time   IRON 144 10/20/2023 0934   IRON 37 01/18/2019 0812   TIBC 263 10/20/2023 0934   TIBC 233 (L) 01/18/2019 0812   FERRITIN 264 10/20/2023 0934   FERRITIN 21 01/18/2019 0812   IRONPCTSAT 55 (H) 10/20/2023 0934   IRONPCTSAT 16 01/18/2019 0812     Addendum I have seen the patient, examined her. I agree with the assessment and and plan and have edited the notes.   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  Her ferritin is in the range of 200s.  I recommend phlebotomy for her hemochromatosis,  with goal of ferritin less than 50 and transferrin saturation less than 50%.  However she is mildly anemic, which is likely multifactorial, will check a B12, folate, reticulocyte count etc. due to the mild anemia, she may not tolerate frequent phlebotomy, will space out to every 3 months.  If she has significant issue with phlebotomy or not effective, I would considering iron chelating agent.  Will also check echocardiogram to rule out CHF from hemochromatosis.  All questions were answered.  Plan to start phlebotomy next week.  Onita Mattock MD 10/20/2023

## 2023-10-20 NOTE — Assessment & Plan Note (Addendum)
 The patient has medical history of type 1 diabetes, hypothyroid, osteoarthritis, degenerative disc disease, and generalized anxiety disorder. She had been, historically, iron deficient. She had taken oral iron supplements most of her life. About a year ago, her iron levels normalized and she came off the oral iron supplement. She had routine labs done in 06/2023 showing iron overload. At that time, her Hgb and Hct were normal at 12.2 and 36.2 respectively. Her ferritin was 281. Her iron was 156, TIBC 233, transferrin 77, and iron saturation of 67%. Her labs were rechecked in 08/2023.  At that time, her iron was 126, TIBC 243, transferrin 117, and saturation of 52%.  Her ferritin was 268.  Her most recent CMP was done on 02/13/2023.  Her BUN was 16, creatinine 0.7, EGFR was 84.2.  LFTs were normal.  AST 31, ALT 31, and alkaline phosphatase 96. She had a hospitalization in May 2024 due to pyelonephritis.  At the time, she was screened for hepatitis B and C and HIV.  Both hepatitis B and C were negative.  HIV was also negative.  She had an ultrasound of RUQ of the abdomen.  It was normal.  She also underwent CT abdomen pelvis with contrast during hospitalization.  She was found to have a hemangioma in the lateral aspect of left hepatic lobe approximately 1.2 cm in diameter.  There were no other suspicious liver lesions identified.  The spleen was unremarkable. We discussed multiple causes of iron overload.  Hereditary hemochromatosis is suspected since she does have a close family member with the same condition.  Will check HFE gene profile to determine if her condition is genetically related.  Common causes also include infection, chronic inflammation, too much oral iron intake, and liver injury.  We will repeat CBC, iron studies, ferritin, CMP, and sed rate for further evaluation.  We discussed treatment, if necessary, would most likely be therapeutic phlebotomy.  Without diagnosis of hereditary hemochromatosis, she  could donate blood in order to keep iron levels down.  She has historically been told she was unable to donate blood due to type 1 diabetes.  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 She is also asymptomatic  It will be necessary to provide her with therapeutic phlebotomy.  Most recently, Hgb was 279. We will plan for therapeutic phlebotomy in the next week. We would continue to monitor her blood work every 3 to 6 months and determine necessity for regular treatments of therapeutic phlebotomy.

## 2023-10-29 ENCOUNTER — Other Ambulatory Visit: Payer: Self-pay | Admitting: Nurse Practitioner

## 2023-10-29 ENCOUNTER — Encounter (INDEPENDENT_AMBULATORY_CARE_PROVIDER_SITE_OTHER): Payer: Self-pay | Admitting: Internal Medicine

## 2023-10-29 ENCOUNTER — Ambulatory Visit (INDEPENDENT_AMBULATORY_CARE_PROVIDER_SITE_OTHER): Admitting: Internal Medicine

## 2023-10-29 VITALS — BP 126/73 | HR 74 | Temp 97.9°F | Ht 62.0 in | Wt 131.0 lb

## 2023-10-29 DIAGNOSIS — E1069 Type 1 diabetes mellitus with other specified complication: Secondary | ICD-10-CM | POA: Diagnosis not present

## 2023-10-29 DIAGNOSIS — E538 Deficiency of other specified B group vitamins: Secondary | ICD-10-CM | POA: Insufficient documentation

## 2023-10-29 DIAGNOSIS — Z6823 Body mass index (BMI) 23.0-23.9, adult: Secondary | ICD-10-CM

## 2023-10-29 DIAGNOSIS — E669 Obesity, unspecified: Secondary | ICD-10-CM | POA: Diagnosis not present

## 2023-10-29 DIAGNOSIS — Z7985 Long-term (current) use of injectable non-insulin antidiabetic drugs: Secondary | ICD-10-CM

## 2023-10-29 MED ORDER — VITAMIN B-12 1000 MCG PO TABS: 1000.0000 ug | ORAL_TABLET | ORAL | Status: AC

## 2023-10-29 NOTE — Assessment & Plan Note (Signed)
 Weight: decrease of 59 lb (31.1%) over 2 years, 1 month  Start: 09/17/2021 190 lb (86.2 kg)  End: 10/29/2023 131 lb (59.4 kg)  Overweight/obesity under medical management Currently in the maintenance phase of weight management with a recent weight gain of 3 pounds. Bioimpedance analysis indicates maintenance of body fat with a slight increase in muscle mass. Following a 1200 calorie nutrition plan 85% of the time, consuming more whole foods, and engaging in 45 minutes of cardio exercise daily. Occasionally skips meals. - Continue current nutrition and exercise regimen - Encourage consistent meal intake to avoid skipping meals - Consider meal replacement options for lunch to prevent meal skipping

## 2023-10-29 NOTE — Assessment & Plan Note (Signed)
 She reports her most recent A1c was 5.3, she is on insulin pump and continuous monitoring.  She is on Mounjaro 5 mg once a week which has helped with orixegenic signaling and glycemic control.  She is no longer experiencing hypoglycemic episodes.  Continue current regimen

## 2023-10-29 NOTE — Assessment & Plan Note (Signed)
 Recent B12 levels in the 100s she is not taking B12 supplementation she has a history of type 1 diabetes and may be at risk for celiac or atrophic gastritis.  We discussed using high-dose B12 supplementation 3 times a week instead of parenteral for convenience.  She will start B12 1000 mcg 3 times a week

## 2023-10-29 NOTE — Progress Notes (Signed)
 Office: [phone]  /  Fax: [phone]  Weight Summary and Body Composition Analysis (BIA)  Vitals Temp: 97.9 F (36.6 C) BP: 126/73 Pulse Rate: 74 SpO2: 97 %   Anthropometric Measurements Height: 5' 2 (1.575 m) Weight: 131 lb (59.4 kg) BMI (Calculated): 23.95 Weight at Last Visit: 128 lb Weight Lost Since Last Visit: 0 lb Weight Gained Since Last Visit: 3 lb Starting Weight: 190 lb Total Weight Loss (lbs): 59 lb (26.8 kg) Peak Weight: 202 lb   Body Composition  Body Fat %: 35.3 % Fat Mass (lbs): 46.4 lbs Muscle Mass (lbs): 80.6 lbs Total Body Water  (lbs): 68 lbs Visceral Fat Rating : 8    RMR: 1598  Today's Visit #: 18  Starting Date: 09/17/21   Subjective   Chief Complaint: Obesity  Interval History Discussed the use of AI scribe software for clinical note transcription with the patient, who gave verbal consent to proceed.  History of Present Illness   Colleen Zuniga is a 63 year old female with type one diabetes who presents for medical weight management.  She is currently in the maintenance phase of her weight management program.  Since her last office visit, she gained three pounds, but bioimpedance analysis suggests overall maintenance of body fat with a slight increase in muscle mass. She follows a 1200 calorie nutrition plan 85% of the time, consumes more whole foods, gets the recommended amount of protein, maintains adequate hydration, but occasionally skips meals. She exercises seven days a week for 45 minutes doing cardio.  She has a history of type one diabetes and is on an insulin  pump. She is also taking Mounjaro, prescribed by her endocrinologist.  She has been recently diagnosed with hemochromatosis and is scheduled for phlebotomy. She is also anemic and is seeing a hematologist. Her ferritin levels are high, and she is concerned about the phlebotomy due to her anemia and fears of low blood sugar during the procedure. She is not  currently on B12 supplements.  And recently had a B12 level of low  Her family history includes hemochromatosis, as her uncle has the condition.       Challenges affecting patient progress: Caregiver stress.    Pharmacotherapy for weight management: She is currently taking Monjauro with diabetes as the primary indication and obesity secondary with adequate clinical response  and without side effects..   Assessment and Plan   Treatment Plan For Obesity:  Recommended Dietary Goals  Colleen Zuniga is currently in the action stage of change. As such, her goal is to continue weight management plan. She has agreed to: continue current plan  Behavioral Health and Counseling  We discussed the following behavioral modification strategies today: continue to work on maintaining a reduced calorie state, getting the recommended amount of protein, incorporating whole foods, making healthy choices, staying well hydrated and practicing mindfulness when eating..  Additional education and resources provided today: None  Recommended Physical Activity Goals  Colleen Zuniga has been advised to work up to 150 minutes of moderate intensity aerobic activity a week and strengthening exercises 2-3 times per week for cardiovascular health, weight loss maintenance and preservation of muscle mass.   She has agreed to :  Continue current level of physical activity   Medical Interventions and Pharmacotherapy  We discussed various medication options to help Colleen Zuniga with her weight loss efforts and we both agreed to : Adequate clinical response to anti-obesity medication, continue current regimen  Associated Conditions Impacted by Obesity Treatment  Assessment & Plan  B12 deficiency Recent B12 levels in the 100s she is not taking B12 supplementation she has a history of type 1 diabetes and may be at risk for celiac or atrophic gastritis.  We discussed using high-dose B12 supplementation 3 times a week instead of parenteral for  convenience.  She will start B12 1000 mcg 3 times a week Type 1 diabetes mellitus with other specified complication (HCC) She reports her most recent A1c was 5.3, she is on insulin  pump and continuous monitoring.  She is on Mounjaro 5 mg once a week which has helped with orixegenic signaling and glycemic control.  She is no longer experiencing hypoglycemic episodes.  Continue current regimen Generalized obesity with a starting BMI of 34 Weight: decrease of 59 lb (31.1%) over 2 years, 1 month  Start: 09/17/2021 190 lb (86.2 kg)  End: 10/29/2023 131 lb (59.4 kg)  Overweight/obesity under medical management Currently in the maintenance phase of weight management with a recent weight gain of 3 pounds. Bioimpedance analysis indicates maintenance of body fat with a slight increase in muscle mass. Following a 1200 calorie nutrition plan 85% of the time, consuming more whole foods, and engaging in 45 minutes of cardio exercise daily. Occasionally skips meals. - Continue current nutrition and exercise regimen - Encourage consistent meal intake to avoid skipping meals - Consider meal replacement options for lunch to prevent meal skipping BMI 23.0-23.9, adult    Goals of Care Discussion about husband's advanced dementia and potential future need for alternative feeding methods. Emphasized the importance of discussing and documenting preferences regarding invasive procedures like feeding tubes. Highlighted that feeding tubes in advanced dementia may prolong life without improving quality of life. - Discuss and document husband's preferences regarding feeding tube placement - Consider completing a MOST form with primary care provider to document preferences        Objective   Physical Exam:  Blood pressure 126/73, pulse 74, temperature 97.9 F (36.6 C), height 5' 2 (1.575 m), weight 131 lb (59.4 kg), SpO2 97%. Body mass index is 23.96 kg/m.  General: She is overweight, cooperative, alert, well  developed, and in no acute distress. PSYCH: Has normal mood, affect and thought process.   HEENT: EOMI, sclerae are anicteric. Lungs: Normal breathing effort, no conversational dyspnea. Extremities: No edema.  Neurologic: No gross sensory or motor deficits. No tremors or fasciculations noted.    Diagnostic Data Reviewed:  BMET    Component Value Date/Time   NA 138 10/20/2023 0934   NA 140 09/17/2021 1541   K 4.4 10/20/2023 0934   CL 101 10/20/2023 0934   CO2 32 10/20/2023 0934   GLUCOSE 116 (H) 10/20/2023 0934   BUN 19 10/20/2023 0934   BUN 20 09/17/2021 1541   CREATININE 0.84 10/20/2023 0934   CALCIUM  9.6 10/20/2023 0934   GFRNONAA >60 10/20/2023 0934   GFRAA >60 08/08/2019 0920   Lab Results  Component Value Date   HGBA1C 5.9 (H) 03/20/2022   HGBA1C 6.1 (H) 08/11/2016   Lab Results  Component Value Date   INSULIN  <0.4 (L) 09/17/2021   Lab Results  Component Value Date   TSH 0.049 (L) 09/17/2021   CBC    Component Value Date/Time   WBC 4.1 10/20/2023 0934   WBC 5.3 08/06/2022 2346   RBC 3.67 (L) 10/20/2023 0934   RBC 3.67 (L) 10/20/2023 0934   HGB 11.7 (L) 10/20/2023 0934   HGB 10.7 (L) 01/18/2019 0812   HCT 35.1 (L) 10/20/2023 0934   HCT 33.1 (  L) 01/18/2019 0812   PLT 201 10/20/2023 0934   PLT 217 01/18/2019 0812   MCV 95.6 10/20/2023 0934   MCV 92 01/18/2019 0812   MCH 31.9 10/20/2023 0934   MCHC 33.3 10/20/2023 0934   RDW 11.6 10/20/2023 0934   RDW 12.6 01/18/2019 0812   Iron Studies    Component Value Date/Time   IRON 144 10/20/2023 0934   IRON 37 01/18/2019 0812   TIBC 263 10/20/2023 0934   TIBC 233 (L) 01/18/2019 0812   FERRITIN 264 10/20/2023 0934   FERRITIN 21 01/18/2019 0812   IRONPCTSAT 55 (H) 10/20/2023 0934   IRONPCTSAT 16 01/18/2019 0812   Lipid Panel     Component Value Date/Time   CHOL 156 04/29/2018 1354   TRIG 76 04/29/2018 1354   HDL 74 04/29/2018 1354   LDLCALC 67 04/29/2018 1354   Hepatic Function Panel     Component  Value Date/Time   PROT 6.7 10/20/2023 0934   PROT 6.7 09/17/2021 1541   ALBUMIN  4.3 10/20/2023 0934   ALBUMIN  4.5 09/17/2021 1541   AST 40 10/20/2023 0934   ALT 34 10/20/2023 0934   ALKPHOS 108 10/20/2023 0934   BILITOT 0.6 10/20/2023 0934      Component Value Date/Time   TSH 0.049 (L) 09/17/2021 1541   Nutritional Lab Results  Component Value Date   VD25OH 70.8 09/17/2021   VD25OH 41.3 04/29/2018    Medications: Outpatient Encounter Medications as of 10/29/2023  Medication Sig Note   [START ON 10/30/2023] cyanocobalamin (VITAMIN B12) 1000 MCG tablet Take 1 tablet (1,000 mcg total) by mouth 3 (three) times a week.    albuterol  (VENTOLIN  HFA) 108 (90 Base) MCG/ACT inhaler Inhale 2 puffs into the lungs every 4 (four) hours as needed for shortness of breath or wheezing.    ALPRAZolam  (XANAX ) 0.5 MG tablet Take 0.5 mg by mouth every 6 (six) hours as needed for sleep or anxiety.    atorvastatin  (LIPITOR) 10 MG tablet Take 10 mg by mouth daily after supper.    BAQSIMI  TWO PACK 3 MG/DOSE POWD Place 3 mg into the nose as needed (hypoglycemia).    buPROPion  (WELLBUTRIN  XL) 300 MG 24 hr tablet Take 300 mg by mouth daily after breakfast.    Calcium  Carb-Cholecalciferol (CALCIUM  500 + D PO) Take 2 tablets by mouth 2 (two) times daily.    Continuous Glucose Monitor DEVI 1 Device by Other route continuous.    docusate sodium  (COLACE) 100 MG capsule Take 100 mg by mouth every evening.    escitalopram  (LEXAPRO ) 10 MG tablet Take 1 tablet (10 mg total) by mouth at bedtime.    fluticasone  (FLONASE ) 50 MCG/ACT nasal spray Place 2 sprays into both nostrils daily as needed for allergies.    gabapentin  (NEURONTIN ) 300 MG capsule Take 300 mg by mouth 3 (three) times daily. 12/04/2021: Dulcy Adolphus only!   insulin  aspart (NOVOLOG ) 100 UNIT/ML injection Continuous with insulin  pump 12/04/2021: Averages about 26 units per day   irbesartan  (AVAPRO ) 300 MG tablet Take 300 mg by mouth daily after breakfast.     levocetirizine (XYZAL ) 5 MG tablet Take 5 mg by mouth daily.    levothyroxine  (SYNTHROID ) 100 MCG tablet Take 88 mcg by mouth daily before breakfast.    Magnesium  250 MG TABS Take 500 mg by mouth at bedtime.    methocarbamol  (ROBAXIN ) 500 MG tablet Take 1 tablet (500 mg total) by mouth every 6 (six) hours as needed for muscle spasms.    metoCLOPramide  (REGLAN )  5 MG tablet Take 5 mg by mouth every 6 (six) hours as needed for nausea.    Multiple Vitamins-Minerals (AIRBORNE PO) Take 2 tablets by mouth daily.    ondansetron  (ZOFRAN -ODT) 8 MG disintegrating tablet Take 1 tablet (8 mg total) by mouth every 8 (eight) hours as needed for nausea or vomiting.    ONETOUCH ULTRA test strip 1 each by Other route 3 (three) times daily.    Polyvinyl Alcohol -Povidone (REFRESH OP) Place 1 drop into both eyes in the morning and at bedtime.    potassium gluconate 595 (99 K) MG TABS tablet Take 595 mg by mouth daily after breakfast.    Probiotic Product (FORTIFY DAILY PROBIOTIC) CAPS Take 1 capsule by mouth daily.    tirzepatide (MOUNJARO) 5 MG/0.5ML Pen Inject 5 mg into the skin once a week. 08/02/2022: Take on Wednesdays per patient    traMADol  (ULTRAM ) 50 MG tablet Take 1-2 tablets (50-100 mg total) by mouth every 6 (six) hours as needed. 08/02/2022: Still has on hand   vitamin C (ASCORBIC ACID ) 250 MG tablet Take 500 mg by mouth 2 (two) times daily.    No facility-administered encounter medications on file as of 10/29/2023.     Follow-Up   Return in about 2 months (around 12/29/2023) for For Weight Mangement with Dr. Francyne.SABRA She was informed of the importance of frequent follow up visits to maximize her success with intensive lifestyle modifications for her multiple health conditions.  Attestation Statement   Reviewed by clinician on day of visit: allergies, medications, problem list, medical history, surgical history, family history, social history, and previous encounter notes.     Lucas Francyne, MD

## 2023-10-30 ENCOUNTER — Inpatient Hospital Stay

## 2023-10-30 ENCOUNTER — Other Ambulatory Visit

## 2023-10-30 LAB — CBC WITH DIFFERENTIAL (CANCER CENTER ONLY)
Abs Immature Granulocytes: 0 K/uL (ref 0.00–0.07)
Basophils Absolute: 0 K/uL (ref 0.0–0.1)
Basophils Relative: 1 %
Eosinophils Absolute: 0.1 K/uL (ref 0.0–0.5)
Eosinophils Relative: 4 %
HCT: 33.5 % — ABNORMAL LOW (ref 36.0–46.0)
Hemoglobin: 11.2 g/dL — ABNORMAL LOW (ref 12.0–15.0)
Immature Granulocytes: 0 %
Lymphocytes Relative: 36 %
Lymphs Abs: 1.4 K/uL (ref 0.7–4.0)
MCH: 31.8 pg (ref 26.0–34.0)
MCHC: 33.4 g/dL (ref 30.0–36.0)
MCV: 95.2 fL (ref 80.0–100.0)
Monocytes Absolute: 0.4 K/uL (ref 0.1–1.0)
Monocytes Relative: 11 %
Neutro Abs: 1.9 K/uL (ref 1.7–7.7)
Neutrophils Relative %: 48 %
Platelet Count: 200 K/uL (ref 150–400)
RBC: 3.52 MIL/uL — ABNORMAL LOW (ref 3.87–5.11)
RDW: 11.7 % (ref 11.5–15.5)
WBC Count: 3.9 K/uL — ABNORMAL LOW (ref 4.0–10.5)
nRBC: 0 % (ref 0.0–0.2)

## 2023-10-30 LAB — FERRITIN: Ferritin: 237 ng/mL (ref 11–307)

## 2023-10-30 NOTE — Progress Notes (Signed)
 Colleen Zuniga presents today for phlebotomy per MD orders. Phlebotomy procedure started at 0923 and ended at 0927. 373 grams removed. Pt c/o feeling light headed and having 5/10 H/A. Phlebotomy stopped and IV needle removed intact. VSS. Reclined pt with feet elevated. Kate,PA notified. Patient observed for 30 minutes after procedure without any incident. Pt states she feel back to normal. VSS.

## 2023-10-30 NOTE — Patient Instructions (Signed)

## 2023-11-20 ENCOUNTER — Ambulatory Visit (HOSPITAL_COMMUNITY)
Admission: RE | Admit: 2023-11-20 | Discharge: 2023-11-20 | Disposition: A | Discharge status: Home / Self Care | Source: Ambulatory Visit | Attending: Nurse Practitioner | Admitting: Nurse Practitioner

## 2023-11-20 DIAGNOSIS — I358 Other nonrheumatic aortic valve disorders: Secondary | ICD-10-CM | POA: Insufficient documentation

## 2023-11-20 DIAGNOSIS — I3481 Nonrheumatic mitral (valve) annulus calcification: Secondary | ICD-10-CM | POA: Diagnosis not present

## 2023-11-20 DIAGNOSIS — R0602 Shortness of breath: Secondary | ICD-10-CM | POA: Diagnosis present

## 2023-11-20 LAB — ECHOCARDIOGRAM COMPLETE
AR max vel: 2.5 cm2
AV Area VTI: 2.52 cm2
AV Area mean vel: 2.49 cm2
AV Mean grad: 7 mmHg
AV Peak grad: 12 mmHg
Ao pk vel: 1.73 m/s
Area-P 1/2: 3.82 cm2
Calc EF: 66.8 %
S' Lateral: 2.8 cm
Single Plane A2C EF: 68.8 %
Single Plane A4C EF: 68.8 %

## 2023-11-20 NOTE — Progress Notes (Signed)
  Echocardiogram 2D Echocardiogram has been performed.  Colleen Zuniga 11/20/2023, 12:06 PM

## 2023-12-31 ENCOUNTER — Ambulatory Visit (INDEPENDENT_AMBULATORY_CARE_PROVIDER_SITE_OTHER): Admitting: Internal Medicine

## 2024-01-05 ENCOUNTER — Ambulatory Visit (INDEPENDENT_AMBULATORY_CARE_PROVIDER_SITE_OTHER): Admitting: Internal Medicine

## 2024-01-05 ENCOUNTER — Encounter (INDEPENDENT_AMBULATORY_CARE_PROVIDER_SITE_OTHER): Payer: Self-pay | Admitting: Internal Medicine

## 2024-01-05 VITALS — BP 117/61 | HR 71 | Temp 98.5°F | Ht 62.0 in | Wt 133.0 lb

## 2024-01-05 DIAGNOSIS — E1069 Type 1 diabetes mellitus with other specified complication: Secondary | ICD-10-CM

## 2024-01-05 DIAGNOSIS — Z7985 Long-term (current) use of injectable non-insulin antidiabetic drugs: Secondary | ICD-10-CM

## 2024-01-05 DIAGNOSIS — E669 Obesity, unspecified: Secondary | ICD-10-CM | POA: Diagnosis not present

## 2024-01-05 DIAGNOSIS — Z6824 Body mass index (BMI) 24.0-24.9, adult: Secondary | ICD-10-CM | POA: Diagnosis not present

## 2024-01-05 DIAGNOSIS — Z636 Dependent relative needing care at home: Secondary | ICD-10-CM

## 2024-01-05 NOTE — Progress Notes (Signed)
 Office: [phone]  /  Fax: [phone]  Weight Summary and Body Composition Analysis (BIA)  Vitals Temp: 98.5 F (36.9 C) BP: 117/61 Pulse Rate: 71 SpO2: 99 %   Anthropometric Measurements Height: 5' 2 (1.575 m) Weight: 133 lb (60.3 kg) BMI (Calculated): 24.32 Weight at Last Visit: 131 lb Weight Lost Since Last Visit: 0 lb Weight Gained Since Last Visit: 2 lb Starting Weight: 190 lb Peak Weight: 202 lb   Body Composition  Body Fat %: 36.4 % Fat Mass (lbs): 48.6 lbs Muscle Mass (lbs): 80.4 lbs Total Body Water  (lbs): 69.6 lbs Visceral Fat Rating : 8    RMR: 1598  Today's Visit #: 19  Starting Date: 09/17/21   Subjective   Chief Complaint: Obesity  Interval History Discussed the use of AI scribe software for clinical note transcription with the patient, who gave verbal consent to proceed.  History of Present Illness Colleen Zuniga is a 63 year old female with type one diabetes who presents for medical weight management.  She is in the maintenance phase of her weight loss journey, having successfully reduced her BMI from 30 to 24 over the past two years. She has lost 57 pounds, approximately 30% of her body weight.  She follows a 1200 calorie nutrition plan, adhering to it 90% of the time.  She engages in physical activity two to five days a week, performing 30 minutes of cardio and strengthening exercises.  Her medical history includes type one diabetes and caregiver stress, which are relevant to her overall health and weight management efforts.     Challenges affecting patient progress: medical comorbidities, moderate to high levels of stress, and her husband has dementia.    Pharmacotherapy for weight management: She is currently taking Monjauro with diabetes as the primary indication and obesity secondary with adequate clinical response  and experiencing the following side effects: Hypoglycemia.   Assessment and Plan   Treatment Plan For  Obesity:  Recommended Dietary Goals  Colleen Zuniga is currently in the action stage of change. As such, her goal is to continue weight management plan. She has agreed to: continue current plan  Behavioral Health and Counseling  We discussed the following behavioral modification strategies today: continue to work on maintaining a reduced calorie state, getting the recommended amount of protein, incorporating whole foods, making healthy choices, staying well hydrated and practicing mindfulness when eating. and increase protein intake, fibrous foods (25 grams per day for women, 30 grams for men) and water  to improve satiety and decrease hunger signals. .  Additional education and resources provided today: None  Recommended Physical Activity Goals  Colleen Zuniga has been advised to work up to 150 minutes of moderate intensity aerobic activity a week and strengthening exercises 2-3 times per week for cardiovascular health, weight loss maintenance and preservation of muscle mass.  She has agreed to :  Think about enjoyable ways to increase daily physical activity and overcoming barriers to exercise, Increase physical activity in their day and reduce sedentary time (increase NEAT)., Increase volume of physical activity to a goal of 240 minutes a week, and Combine aerobic and strengthening exercises for efficiency and improved cardiometabolic health.  Medical Interventions and Pharmacotherapy  We discussed various medication options to help Colleen Zuniga with her weight loss efforts and we both agreed to : Patient advised to discussed either insulin  adjustment or reductions in GLP-1 with prescribing physician due to the presence of recurrent hypoglycemia.  Associated Conditions Impacted by Obesity Treatment  Assessment & Plan  Type 1 diabetes mellitus with other specified complication [facility]) Patient reports experiencing frequent hypoglycemic events these have been self managed without any precipitating events.  She is on  an insulin  pump, CGM and is also on Mounjaro off label.  She works with the CDE.  We discussed the risk associated with hypoglycemia particularly at her age.  She has an A1c of 5.5 and all overall very tight control.  She is very committed to avoiding diabetic complications and therefore has excellent self-monitoring skills.  These lows are frequent and are also contributing to compensatory eating which has been affecting her weight loss.  Patient strongly advised to reach out to prescribing physician to make adjustments to her basal rates.  The other option is reducing her GLP-1 patient would prefer the former. Generalized obesity with a starting BMI of 34 BMI 24.0-24.9, adult Weight loss maintenance phase Successful weight loss of 57 pounds, approximately 30% of body weight, over two years. Current BMI is 24.  With improvements in SAT and VAT.  Currently in maintenance phase, adhering to a 1200 calorie nutrition plan 90% of the time. Engages in cardio and strengthening exercises 2-5 days per week for 30 minutes. - Continue 1200 calorie nutrition plan with 90% adherence. - Maintain exercise routine of cardio and strengthening 2-5 days per week for 30 minutes. -Recent weight gain likely due to hypoglycemic events she will work with prescribing provider to make adjustments either to basal rate or GLP-1. Caregiver stress Due to husband who has dementia.  She is also caring for her 55 year old mother.  She is currently on Lexapro  10 mg once daily and Wellbutrin  without any adverse effects and has benefited from medication.  She will continue medication.           Objective   Physical Exam:  Blood pressure 117/61, pulse 71, temperature 98.5 F (36.9 C), height 5' 2 (1.575 m), weight 133 lb (60.3 kg), SpO2 99%. Body mass index is 24.33 kg/m.  General: She is overweight, cooperative, alert, well developed, and in no acute distress. PSYCH: Has normal mood, affect and thought process.   HEENT:  EOMI, sclerae are anicteric. Lungs: Normal breathing effort, no conversational dyspnea. Extremities: No edema.  Neurologic: No gross sensory or motor deficits. No tremors or fasciculations noted.    Diagnostic Data Reviewed:  BMET    Component Value Date/Time   NA 138 10/20/2023 0934   NA 140 09/17/2021 1541   K 4.4 10/20/2023 0934   CL 101 10/20/2023 0934   CO2 32 10/20/2023 0934   GLUCOSE 116 (H) 10/20/2023 0934   BUN 19 10/20/2023 0934   BUN 20 09/17/2021 1541   CREATININE 0.84 10/20/2023 0934   CALCIUM  9.6 10/20/2023 0934   GFRNONAA >60 10/20/2023 0934   GFRAA >60 08/08/2019 0920   Lab Results  Component Value Date   HGBA1C 5.9 (H) 03/20/2022   HGBA1C 6.1 (H) 08/11/2016   Lab Results  Component Value Date   INSULIN  <0.4 (L) 09/17/2021   Lab Results  Component Value Date   TSH 0.049 (L) 09/17/2021   CBC    Component Value Date/Time   WBC 3.9 (L) 10/30/2023 0837   WBC 5.3 08/06/2022 2346   RBC 3.52 (L) 10/30/2023 0837   HGB 11.2 (L) 10/30/2023 0837   HGB 10.7 (L) 01/18/2019 0812   HCT 33.5 (L) 10/30/2023 0837   HCT 33.1 (L) 01/18/2019 0812   PLT 200 10/30/2023 0837   PLT 217 01/18/2019 0812   MCV 95.2 10/30/2023  0837   MCV 92 01/18/2019 0812   MCH 31.8 10/30/2023 0837   MCHC 33.4 10/30/2023 0837   RDW 11.7 10/30/2023 0837   RDW 12.6 01/18/2019 0812   Iron Studies    Component Value Date/Time   IRON 144 10/20/2023 0934   IRON 37 01/18/2019 0812   TIBC 263 10/20/2023 0934   TIBC 233 (L) 01/18/2019 0812   FERRITIN 237 10/30/2023 0837   FERRITIN 21 01/18/2019 0812   IRONPCTSAT 55 (H) 10/20/2023 0934   IRONPCTSAT 16 01/18/2019 0812   Lipid Panel     Component Value Date/Time   CHOL 156 04/29/2018 1354   TRIG 76 04/29/2018 1354   HDL 74 04/29/2018 1354   LDLCALC 67 04/29/2018 1354   Hepatic Function Panel     Component Value Date/Time   PROT 6.7 10/20/2023 0934   PROT 6.7 09/17/2021 1541   ALBUMIN  4.3 10/20/2023 0934   ALBUMIN  4.5  09/17/2021 1541   AST 40 10/20/2023 0934   ALT 34 10/20/2023 0934   ALKPHOS 108 10/20/2023 0934   BILITOT 0.6 10/20/2023 0934      Component Value Date/Time   TSH 0.049 (L) 09/17/2021 1541   Nutritional Lab Results  Component Value Date   VD25OH 70.8 09/17/2021   VD25OH 41.3 04/29/2018    Medications: Outpatient Encounter Medications as of 01/05/2024  Medication Sig Note   albuterol  (VENTOLIN  HFA) 108 (90 Base) MCG/ACT inhaler Inhale 2 puffs into the lungs every 4 (four) hours as needed for shortness of breath or wheezing.    ALPRAZolam  (XANAX ) 0.5 MG tablet Take 0.5 mg by mouth every 6 (six) hours as needed for sleep or anxiety.    atorvastatin  (LIPITOR) 10 MG tablet Take 10 mg by mouth daily after supper.    BAQSIMI  TWO PACK 3 MG/DOSE POWD Place 3 mg into the nose as needed (hypoglycemia).    buPROPion  (WELLBUTRIN  XL) 300 MG 24 hr tablet Take 300 mg by mouth daily after breakfast.    Calcium  Carb-Cholecalciferol (CALCIUM  500 + D PO) Take 2 tablets by mouth 2 (two) times daily.    Continuous Glucose Monitor DEVI 1 Device by Other route continuous.    cyanocobalamin  (VITAMIN B12) 1000 MCG tablet Take 1 tablet (1,000 mcg total) by mouth 3 (three) times a week.    docusate sodium  (COLACE) 100 MG capsule Take 100 mg by mouth every evening.    escitalopram  (LEXAPRO ) 10 MG tablet Take 1 tablet (10 mg total) by mouth at bedtime.    fluticasone  (FLONASE ) 50 MCG/ACT nasal spray Place 2 sprays into both nostrils daily as needed for allergies.    gabapentin  (NEURONTIN ) 300 MG capsule Take 300 mg by mouth 3 (three) times daily. 12/04/2021: Dulcy Adolphus only!   insulin  aspart (NOVOLOG ) 100 UNIT/ML injection Continuous with insulin  pump 12/04/2021: Averages about 26 units per day   irbesartan  (AVAPRO ) 300 MG tablet Take 300 mg by mouth daily after breakfast.    levocetirizine (XYZAL ) 5 MG tablet Take 5 mg by mouth daily.    levothyroxine  (SYNTHROID ) 100 MCG tablet Take 88 mcg by mouth daily  before breakfast.    Magnesium  250 MG TABS Take 500 mg by mouth at bedtime.    methocarbamol  (ROBAXIN ) 500 MG tablet Take 1 tablet (500 mg total) by mouth every 6 (six) hours as needed for muscle spasms.    metoCLOPramide  (REGLAN ) 5 MG tablet Take 5 mg by mouth every 6 (six) hours as needed for nausea.    Multiple Vitamins-Minerals (AIRBORNE PO)  Take 2 tablets by mouth daily.    ondansetron  (ZOFRAN -ODT) 8 MG disintegrating tablet Take 1 tablet (8 mg total) by mouth every 8 (eight) hours as needed for nausea or vomiting.    ONETOUCH ULTRA test strip 1 each by Other route 3 (three) times daily.    Polyvinyl Alcohol -Povidone (REFRESH OP) Place 1 drop into both eyes in the morning and at bedtime.    potassium gluconate 595 (99 K) MG TABS tablet Take 595 mg by mouth daily after breakfast.    Probiotic Product (FORTIFY DAILY PROBIOTIC) CAPS Take 1 capsule by mouth daily.    tirzepatide (MOUNJARO) 5 MG/0.5ML Pen Inject 5 mg into the skin once a week. 08/02/2022: Take on Wednesdays per patient    traMADol  (ULTRAM ) 50 MG tablet Take 1-2 tablets (50-100 mg total) by mouth every 6 (six) hours as needed. 08/02/2022: Still has on hand   vitamin C (ASCORBIC ACID ) 250 MG tablet Take 500 mg by mouth 2 (two) times daily.    No facility-administered encounter medications on file as of 01/05/2024.     Follow-Up   Return in about 4 weeks (around 02/02/2024) for For Weight Mangement with Dr. Francyne.SABRA She was informed of the importance of frequent follow up visits to maximize her success with intensive lifestyle modifications for her multiple health conditions.  Attestation Statement   Reviewed by clinician on day of visit: allergies, medications, problem list, medical history, surgical history, family history, social history, and previous encounter notes.     Lucas Francyne, MD

## 2024-01-05 NOTE — Assessment & Plan Note (Signed)
 Patient reports experiencing frequent hypoglycemic events these have been self managed without any precipitating events.  She is on an insulin  pump, CGM and is also on Mounjaro off label.  She works with the CDE.  We discussed the risk associated with hypoglycemia particularly at her age.  She has an A1c of 5.5 and all overall very tight control.  She is very committed to avoiding diabetic complications and therefore has excellent self-monitoring skills.  These lows are frequent and are also contributing to compensatory eating which has been affecting her weight loss.  Patient strongly advised to reach out to prescribing physician to make adjustments to her basal rates.  The other option is reducing her GLP-1 patient would prefer the former.

## 2024-01-05 NOTE — Assessment & Plan Note (Signed)
 Weight loss maintenance phase Successful weight loss of 57 pounds, approximately 30% of body weight, over two years. Current BMI is 24.  With improvements in SAT and VAT.  Currently in maintenance phase, adhering to a 1200 calorie nutrition plan 90% of the time. Engages in cardio and strengthening exercises 2-5 days per week for 30 minutes. - Continue 1200 calorie nutrition plan with 90% adherence. - Maintain exercise routine of cardio and strengthening 2-5 days per week for 30 minutes. -Recent weight gain likely due to hypoglycemic events she will work with prescribing provider to make adjustments either to basal rate or GLP-1.

## 2024-01-05 NOTE — Assessment & Plan Note (Signed)
 Due to husband who has dementia.  She is also caring for her 65 year old mother.  She is currently on Lexapro  10 mg once daily and Wellbutrin  without any adverse effects and has benefited from medication.  She will continue medication.

## 2024-01-22 ENCOUNTER — Inpatient Hospital Stay

## 2024-01-22 ENCOUNTER — Other Ambulatory Visit: Payer: Self-pay | Admitting: Nurse Practitioner

## 2024-01-22 ENCOUNTER — Inpatient Hospital Stay: Attending: Nurse Practitioner

## 2024-01-22 LAB — CBC WITH DIFFERENTIAL (CANCER CENTER ONLY)
Abs Immature Granulocytes: 0.01 K/uL (ref 0.00–0.07)
Basophils Absolute: 0 K/uL (ref 0.0–0.1)
Basophils Relative: 0 %
Eosinophils Absolute: 0.2 K/uL (ref 0.0–0.5)
Eosinophils Relative: 3 %
HCT: 37.6 % (ref 36.0–46.0)
Hemoglobin: 12.7 g/dL (ref 12.0–15.0)
Immature Granulocytes: 0 %
Lymphocytes Relative: 19 %
Lymphs Abs: 1.3 K/uL (ref 0.7–4.0)
MCH: 31.9 pg (ref 26.0–34.0)
MCHC: 33.8 g/dL (ref 30.0–36.0)
MCV: 94.5 fL (ref 80.0–100.0)
Monocytes Absolute: 0.7 K/uL (ref 0.1–1.0)
Monocytes Relative: 10 %
Neutro Abs: 4.6 K/uL (ref 1.7–7.7)
Neutrophils Relative %: 68 %
Platelet Count: 215 K/uL (ref 150–400)
RBC: 3.98 MIL/uL (ref 3.87–5.11)
RDW: 11.2 % — ABNORMAL LOW (ref 11.5–15.5)
WBC Count: 6.8 K/uL (ref 4.0–10.5)
nRBC: 0 % (ref 0.0–0.2)

## 2024-01-22 LAB — FERRITIN: Ferritin: 210 ng/mL (ref 11–307)

## 2024-01-22 NOTE — Patient Instructions (Signed)

## 2024-01-22 NOTE — Progress Notes (Signed)
 Colleen Zuniga presents today for phlebotomy per MD orders. Phlebotomy procedure started at 1050 and ended at 1055. 258 grams removed. Patient observed for 30 minutes after procedure without any incident. Patient tolerated procedure well. IV needle removed intact. Water  and yogurt given.

## 2024-01-22 NOTE — Progress Notes (Signed)
 Treatment plan entered for therapeutic phlebotomy to be done every 3 months. The goal is to have ferritin < 50. Hold for Hgb < 11. Will only pull 250 cc due to dizziness after drawing full unit during first treatment.  -Powell Lessen, NP

## 2024-01-24 ENCOUNTER — Other Ambulatory Visit (INDEPENDENT_AMBULATORY_CARE_PROVIDER_SITE_OTHER): Payer: Self-pay | Admitting: Internal Medicine

## 2024-01-24 DIAGNOSIS — Z636 Dependent relative needing care at home: Secondary | ICD-10-CM

## 2024-01-25 ENCOUNTER — Telehealth (INDEPENDENT_AMBULATORY_CARE_PROVIDER_SITE_OTHER): Payer: Self-pay | Admitting: Internal Medicine

## 2024-01-25 ENCOUNTER — Other Ambulatory Visit (INDEPENDENT_AMBULATORY_CARE_PROVIDER_SITE_OTHER): Payer: Self-pay

## 2024-01-25 DIAGNOSIS — Z636 Dependent relative needing care at home: Secondary | ICD-10-CM

## 2024-01-25 MED ORDER — ESCITALOPRAM OXALATE 10 MG PO TABS: 10.0000 mg | ORAL_TABLET | Freq: Every day | ORAL | 0 refills | Status: AC

## 2024-01-25 NOTE — Telephone Encounter (Signed)
Prescription sent to mail order pharmacy 

## 2024-01-25 NOTE — Telephone Encounter (Signed)
 Pt was in on 10/28 and a refill of lexapro  was to be sent in and she stated it was not and she has 5 pills left. She uses mail order so if the refill could get it sent asap please.

## 2024-02-11 ENCOUNTER — Encounter: Payer: Self-pay | Admitting: Nurse Practitioner

## 2024-03-01 ENCOUNTER — Other Ambulatory Visit: Payer: Self-pay

## 2024-03-07 ENCOUNTER — Encounter (INDEPENDENT_AMBULATORY_CARE_PROVIDER_SITE_OTHER): Payer: Self-pay | Admitting: Internal Medicine

## 2024-03-07 ENCOUNTER — Ambulatory Visit (INDEPENDENT_AMBULATORY_CARE_PROVIDER_SITE_OTHER): Payer: Self-pay | Admitting: Internal Medicine

## 2024-03-07 VITALS — BP 122/66 | HR 68 | Temp 98.1°F | Ht 62.0 in | Wt 132.0 lb

## 2024-03-07 DIAGNOSIS — E669 Obesity, unspecified: Secondary | ICD-10-CM

## 2024-03-07 DIAGNOSIS — Z794 Long term (current) use of insulin: Secondary | ICD-10-CM

## 2024-03-07 DIAGNOSIS — E1069 Type 1 diabetes mellitus with other specified complication: Secondary | ICD-10-CM | POA: Diagnosis not present

## 2024-03-07 DIAGNOSIS — Z6824 Body mass index (BMI) 24.0-24.9, adult: Secondary | ICD-10-CM

## 2024-03-07 NOTE — Progress Notes (Signed)
 "  Office: [phone]  /  Fax: [phone]  Weight Summary and Body Composition Analysis (BIA)  Vitals Temp: 98.1 F (36.7 C) BP: 122/66 Pulse Rate: 68 SpO2: 99 %   Anthropometric Measurements Height: 5' 2 (1.575 m) Weight: 132 lb (59.9 kg) BMI (Calculated): 24.14 Weight at Last Visit: 133 lb Weight Lost Since Last Visit: 1 lb Weight Gained Since Last Visit: 0 Starting Weight: 198 lb Total Weight Loss (lbs): 66 lb (29.9 kg) Peak Weight: 202 lb   Body Composition  Body Fat %: 35.4 % Fat Mass (lbs): 46.8 lbs Muscle Mass (lbs): 81.2 lbs Total Body Water  (lbs): 67.2 lbs Visceral Fat Rating : 8    RMR: 1598  Today's Visit #: 20  Starting Date: 09/17/21   Subjective   Chief Complaint: Obesity  Interval History  Discussed the use of AI scribe software for clinical note transcription with the patient, who gave verbal consent to proceed.  History of Present Illness Colleen Zuniga is a 63 year old female with type one diabetes who presents for medical weight management.  Weight management and lifestyle - Currently in maintenance phase after losing 66 pounds - Adheres to a 1200 calorie diet approximately 85% of the time - Exercises five days per week for 30 minutes, including walking and weight training - Attends physical therapy twice weekly  Hypoglycemia - Experiences episodes of low blood sugar approximately once daily - Attributes hypoglycemia to stress, particularly during the holidays - Recent severe hypoglycemic episode with blood glucose of 53 mg/dL, associated with profuse sweating and visual disturbances - Managed severe episode with nasal glucagon  and peanut butter crackers - Continuous glucose monitor did not alert to hypoglycemia; sensor not part of recent recall - Experiences post-exercise 'lag effect' hypoglycemia despite pre-exercise intake of banana with peanut butter - Generally aware of hypoglycemia symptoms, such as leg sensations at  blood glucose of 105 mg/dL, but sometimes asymptomatic even at 70 mg/dL  Type 1 diabetes mellitus management - Ongoing adjustments to basal insulin  rates in response to glycemic control and hypoglycemia  Psychosocial stressors - 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    Pharmacotherapy for weight management: She is currently taking Monjauro with diabetes as the primary indication and obesity secondary with adequate clinical response  and without side effects..   Assessment and Plan   Treatment Plan For Obesity:  Recommended Dietary Goals  Kairi is currently in the action stage of change. As such, her goal is to continue weight management plan. She has agreed to: continue current plan  Behavioral Health and Counseling  We discussed the following behavioral modification strategies today: continue to work on maintaining a reduced calorie state, getting the recommended amount of protein, incorporating whole foods, making healthy choices, staying well hydrated and practicing mindfulness when eating., increase protein intake, fibrous foods (25 grams per day for women, 30 grams for men) and water  to improve satiety and decrease hunger signals. , and continue to work with CDE to adjust insulin  and avoid hypoglycemic spells..  Additional education and resources provided today: None  Recommended Physical Activity Goals  Graysen has been advised to work up to 150 minutes of moderate intensity aerobic activity a week and strengthening exercises  2-3 times per week for cardiovascular health, weight loss maintenance and preservation of muscle mass.  She has agreed to :  Discussed changing exercise routine  to avoid adaptation plateau or training plateau  Medical Interventions and Pharmacotherapy  We discussed various medication options to help Veona with her weight loss efforts and we both agreed to : Adequate clinical response to anti-obesity medication, continue current regimen and if her hypoglycemic spells continue she may need an adjustment to her GLP-1.  This is being managed by endocrinology along with her insulin .  Associated Conditions Impacted by Obesity Treatment  Assessment & Plan Type 1 diabetes mellitus with other specified complication (HCC) Experiencing recurrent hypoglycemia, with one episode per day, exacerbated by stress during holidays. Recent severe hypoglycemic episode managed with nasal glucagon  and peanut butter crackers. Nocturnal hypoglycemia not reported. Blood glucose levels are generally well-managed with an A1c of 5.5%. Recent adjustments to basal insulin  have improved hypoglycemic episodes. Nocturnal hypoglycemia not indicated by nightmares. Anticipated benefit from a service dog for hypoglycemia detection. - Monitor blood glucose levels regularly. - She will work with prescribing physician to adjust insulin  she may also need a reduction in her GLP-1. - Educated on glycemic index and load to manage carbohydrate intake. - Prepare for service dog training to assist with hypoglycemia detection. Generalized obesity with a starting BMI of 34 n the maintenance phase of weight loss, having lost over 66 pounds. Maintaining a 1200 calorie diet, following it 85% of the time, and exercising five days a week with walking and weights. Engaging in physical therapy twice a week. Body composition is improving with a decrease in fat mass and an increase in muscle mass. Current weight is 132 pounds, with a body fat percentage of 35-36% and a visceral fat rating of 8. Hand grip strength is within normal range at 43 pounds. Protein intake is approximately 70 grams per day, with a goal of 75  grams to maintain muscle mass and appetite control. - Continue 1200 calorie diet. - Maintain exercise regimen of five days a week with walking and weights. - Continue physical therapy twice a week. - Increase protein intake to 75 grams per day. - Monitor body composition and weight regularly. Hemochromatosis, unspecified hemochromatosis type Patient's ferritin acceptable range performed at [facility].  Therapeutic phlebotomies for management.        Objective   Physical Exam:  Blood pressure 122/66, pulse 68, temperature 98.1 F (36.7 C), height 5' 2 (1.575 m), weight 132 lb (59.9 kg), SpO2 99%. Body mass index is 24.14 kg/m.  General: She is overweight, cooperative, alert, well developed, and in no acute distress. PSYCH: Has normal mood, affect and thought process.   HEENT: EOMI, sclerae are anicteric. Lungs: Normal breathing effort, no conversational dyspnea. Extremities: No edema.  Neurologic: No gross sensory or motor deficits. No tremors or fasciculations noted.    Diagnostic Data Reviewed:  BMET    Component Value Date/Time   NA 138 10/20/2023 0934   NA 140 09/17/2021 1541   K 4.4 10/20/2023 0934   CL 101 10/20/2023 0934   CO2 32 10/20/2023 0934   GLUCOSE 116 (H) 10/20/2023 0934   BUN 19 10/20/2023 0934   BUN 20 09/17/2021 1541   CREATININE 0.84 10/20/2023 0934   CALCIUM  9.6 10/20/2023 0934   GFRNONAA >60 10/20/2023 0934   GFRAA >60 08/08/2019 0920   Lab Results  Component Value Date   HGBA1C 5.9 (H) 03/20/2022   HGBA1C 6.1 (H) 08/11/2016  Lab Results  Component Value Date   INSULIN  <0.4 (L) 09/17/2021   Lab Results  Component Value Date   TSH 0.049 (L) 09/17/2021   CBC    Component Value Date/Time   WBC 6.8 01/22/2024 0944   WBC 5.3 08/06/2022 2346   RBC 3.98 01/22/2024 0944   HGB 12.7 01/22/2024 0944   HGB 10.7 (L) 01/18/2019 0812   HCT 37.6 01/22/2024 0944   HCT 33.1 (L) 01/18/2019 0812   PLT 215 01/22/2024 0944   PLT  217 01/18/2019 0812   MCV 94.5 01/22/2024 0944   MCV 92 01/18/2019 0812   MCH 31.9 01/22/2024 0944   MCHC 33.8 01/22/2024 0944   RDW 11.2 (L) 01/22/2024 0944   RDW 12.6 01/18/2019 0812   Iron Studies    Component Value Date/Time   IRON 144 10/20/2023 0934   IRON 37 01/18/2019 0812   TIBC 263 10/20/2023 0934   TIBC 233 (L) 01/18/2019 0812   FERRITIN 210 01/22/2024 0944   FERRITIN 21 01/18/2019 0812   IRONPCTSAT 55 (H) 10/20/2023 0934   IRONPCTSAT 16 01/18/2019 0812   Lipid Panel     Component Value Date/Time   CHOL 156 04/29/2018 1354   TRIG 76 04/29/2018 1354   HDL 74 04/29/2018 1354   LDLCALC 67 04/29/2018 1354   Hepatic Function Panel     Component Value Date/Time   PROT 6.7 10/20/2023 0934   PROT 6.7 09/17/2021 1541   ALBUMIN  4.3 10/20/2023 0934   ALBUMIN  4.5 09/17/2021 1541   AST 40 10/20/2023 0934   ALT 34 10/20/2023 0934   ALKPHOS 108 10/20/2023 0934   BILITOT 0.6 10/20/2023 0934      Component Value Date/Time   TSH 0.049 (L) 09/17/2021 1541   Nutritional Lab Results  Component Value Date   VD25OH 70.8 09/17/2021   VD25OH 41.3 04/29/2018    Medications: Outpatient Encounter Medications as of 03/07/2024  Medication Sig Note   albuterol  (VENTOLIN  HFA) 108 (90 Base) MCG/ACT inhaler Inhale 2 puffs into the lungs every 4 (four) hours as needed for shortness of breath or wheezing.    ALPRAZolam  (XANAX ) 0.5 MG tablet Take 0.5 mg by mouth every 6 (six) hours as needed for sleep or anxiety.    atorvastatin  (LIPITOR) 10 MG tablet Take 10 mg by mouth daily after supper.    BAQSIMI  TWO PACK 3 MG/DOSE POWD Place 3 mg into the nose as needed (hypoglycemia).    buPROPion  (WELLBUTRIN  XL) 300 MG 24 hr tablet Take 300 mg by mouth daily after breakfast.    Calcium  Carb-Cholecalciferol (CALCIUM  500 + D PO) Take 2 tablets by mouth 2 (two) times daily.    Continuous Glucose Monitor DEVI 1 Device by Other route continuous.    cyanocobalamin  (VITAMIN B12) 1000 MCG tablet  Take 1 tablet (1,000 mcg total) by mouth 3 (three) times a week.    docusate sodium  (COLACE) 100 MG capsule Take 100 mg by mouth every evening.    escitalopram  (LEXAPRO ) 10 MG tablet Take 1 tablet (10 mg total) by mouth at bedtime.    fluticasone  (FLONASE ) 50 MCG/ACT nasal spray Place 2 sprays into both nostrils daily as needed for allergies.    gabapentin  (NEURONTIN ) 300 MG capsule Take 300 mg by mouth 3 (three) times daily. 12/04/2021: Dulcy Adolphus only!   insulin  aspart (NOVOLOG ) 100 UNIT/ML injection Continuous with insulin  pump 12/04/2021: Averages about 26 units per day   irbesartan  (AVAPRO ) 300 MG tablet Take 300 mg by mouth daily after breakfast.  levocetirizine (XYZAL ) 5 MG tablet Take 5 mg by mouth daily.    levothyroxine  (SYNTHROID ) 100 MCG tablet Take 88 mcg by mouth daily before breakfast.    Magnesium  250 MG TABS Take 500 mg by mouth at bedtime.    methocarbamol  (ROBAXIN ) 500 MG tablet Take 1 tablet (500 mg total) by mouth every 6 (six) hours as needed for muscle spasms.    metoCLOPramide  (REGLAN ) 5 MG tablet Take 5 mg by mouth every 6 (six) hours as needed for nausea.    Multiple Vitamins-Minerals (AIRBORNE PO) Take 2 tablets by mouth daily.    ondansetron  (ZOFRAN -ODT) 8 MG disintegrating tablet Take 1 tablet (8 mg total) by mouth every 8 (eight) hours as needed for nausea or vomiting.    ONETOUCH ULTRA test strip 1 each by Other route 3 (three) times daily.    Polyvinyl Alcohol -Povidone (REFRESH OP) Place 1 drop into both eyes in the morning and at bedtime.    potassium gluconate 595 (99 K) MG TABS tablet Take 595 mg by mouth daily after breakfast.    Probiotic Product (FORTIFY DAILY PROBIOTIC) CAPS Take 1 capsule by mouth daily.    tirzepatide (MOUNJARO) 5 MG/0.5ML Pen Inject 5 mg into the skin once a week. 08/02/2022: Take on Wednesdays per patient    traMADol  (ULTRAM ) 50 MG tablet Take 1-2 tablets (50-100 mg total) by mouth every 6 (six) hours as needed. 08/02/2022: Still has  on hand   vitamin C (ASCORBIC ACID ) 250 MG tablet Take 500 mg by mouth 2 (two) times daily.    No facility-administered encounter medications on file as of 03/07/2024.     Follow-Up   No follow-ups on file.SABRA She was informed of the importance of frequent follow up visits to maximize her success with intensive lifestyle modifications for her multiple health conditions.  Attestation Statement   Reviewed by clinician on day of visit: allergies, medications, problem list, medical history, surgical history, family history, social history, and previous encounter notes.     Lucas Parker, MD  "

## 2024-03-07 NOTE — Assessment & Plan Note (Signed)
 Patient's ferritin acceptable range performed at [facility].  Therapeutic phlebotomies for management.

## 2024-03-07 NOTE — Assessment & Plan Note (Signed)
 Experiencing recurrent hypoglycemia, with one episode per day, exacerbated by stress during holidays. Recent severe hypoglycemic episode managed with nasal glucagon  and peanut butter crackers. Nocturnal hypoglycemia not reported. Blood glucose levels are generally well-managed with an A1c of 5.5%. Recent adjustments to basal insulin  have improved hypoglycemic episodes. Nocturnal hypoglycemia not indicated by nightmares. Anticipated benefit from a service dog for hypoglycemia detection. - Monitor blood glucose levels regularly. - She will work with prescribing physician to adjust insulin  she may also need a reduction in her GLP-1. - Educated on glycemic index and load to manage carbohydrate intake. - Prepare for service dog training to assist with hypoglycemia detection.

## 2024-03-07 NOTE — Assessment & Plan Note (Signed)
 n the maintenance phase of weight loss, having lost over 66 pounds. Maintaining a 1200 calorie diet, following it 85% of the time, and exercising five days a week with walking and weights. Engaging in physical therapy twice a week. Body composition is improving with a decrease in fat mass and an increase in muscle mass. Current weight is 132 pounds, with a body fat percentage of 35-36% and a visceral fat rating of 8. Hand grip strength is within normal range at 43 pounds. Protein intake is approximately 70 grams per day, with a goal of 75 grams to maintain muscle mass and appetite control. - Continue 1200 calorie diet. - Maintain exercise regimen of five days a week with walking and weights. - Continue physical therapy twice a week. - Increase protein intake to 75 grams per day. - Monitor body composition and weight regularly.

## 2024-04-22 ENCOUNTER — Inpatient Hospital Stay: Attending: Nurse Practitioner | Admitting: Nurse Practitioner

## 2024-04-22 ENCOUNTER — Inpatient Hospital Stay

## 2024-04-22 ENCOUNTER — Other Ambulatory Visit

## 2024-04-25 ENCOUNTER — Ambulatory Visit (INDEPENDENT_AMBULATORY_CARE_PROVIDER_SITE_OTHER): Admitting: Internal Medicine
